# Patient Record
Sex: Male | Born: 1955 | Race: White | Hispanic: No | Smoking: Current every day smoker
Health system: Southern US, Community
[De-identification: ages and names within clinical notes are randomized; demographics above are authoritative.]

## PROBLEM LIST (undated history)

## (undated) DIAGNOSIS — I10 Essential (primary) hypertension: Secondary | ICD-10-CM

## (undated) DIAGNOSIS — G450 Vertebro-basilar artery syndrome: Secondary | ICD-10-CM

## (undated) DIAGNOSIS — J9859 Other diseases of mediastinum, not elsewhere classified: Secondary | ICD-10-CM

## (undated) DIAGNOSIS — M1990 Unspecified osteoarthritis, unspecified site: Secondary | ICD-10-CM

## (undated) DIAGNOSIS — R42 Dizziness and giddiness: Secondary | ICD-10-CM

## (undated) DIAGNOSIS — I779 Disorder of arteries and arterioles, unspecified: Secondary | ICD-10-CM

## (undated) DIAGNOSIS — H538 Other visual disturbances: Secondary | ICD-10-CM

## (undated) HISTORY — DX: Dizziness and giddiness: R42

## (undated) HISTORY — PX: MASS EXCISION: SHX2000

## (undated) HISTORY — PX: TONSILLECTOMY: SUR1361

## (undated) HISTORY — PX: KNEE SURGERY: SHX244

## (undated) HISTORY — DX: Other visual disturbances: H53.8

---

## 2000-11-19 ENCOUNTER — Encounter: Payer: Self-pay | Admitting: Family Medicine

## 2000-11-19 ENCOUNTER — Ambulatory Visit (HOSPITAL_COMMUNITY): Admission: RE | Admit: 2000-11-19 | Discharge: 2000-11-19 | Payer: Self-pay | Admitting: Family Medicine

## 2015-10-06 ENCOUNTER — Encounter (HOSPITAL_COMMUNITY): Payer: Self-pay | Admitting: Emergency Medicine

## 2015-10-06 ENCOUNTER — Emergency Department (HOSPITAL_COMMUNITY): Payer: Self-pay

## 2015-10-06 ENCOUNTER — Emergency Department (HOSPITAL_COMMUNITY)
Admission: EM | Admit: 2015-10-06 | Discharge: 2015-10-06 | Disposition: A | Discharge status: Home / Self Care | Payer: Self-pay | Attending: Emergency Medicine | Admitting: Emergency Medicine

## 2015-10-06 DIAGNOSIS — R911 Solitary pulmonary nodule: Secondary | ICD-10-CM | POA: Insufficient documentation

## 2015-10-06 DIAGNOSIS — F17200 Nicotine dependence, unspecified, uncomplicated: Secondary | ICD-10-CM | POA: Insufficient documentation

## 2015-10-06 DIAGNOSIS — I10 Essential (primary) hypertension: Secondary | ICD-10-CM | POA: Insufficient documentation

## 2015-10-06 DIAGNOSIS — R0789 Other chest pain: Secondary | ICD-10-CM | POA: Insufficient documentation

## 2015-10-06 DIAGNOSIS — Z79899 Other long term (current) drug therapy: Secondary | ICD-10-CM | POA: Insufficient documentation

## 2015-10-06 HISTORY — DX: Essential (primary) hypertension: I10

## 2015-10-06 LAB — CBC
HCT: 43.8 % (ref 39.0–52.0)
Hemoglobin: 14.6 g/dL (ref 13.0–17.0)
MCH: 29.4 pg (ref 26.0–34.0)
MCHC: 33.3 g/dL (ref 30.0–36.0)
MCV: 88.3 fL (ref 78.0–100.0)
Platelets: 274 10*3/uL (ref 150–400)
RBC: 4.96 MIL/uL (ref 4.22–5.81)
RDW: 14.3 % (ref 11.5–15.5)
WBC: 13.1 10*3/uL — ABNORMAL HIGH (ref 4.0–10.5)

## 2015-10-06 LAB — D-DIMER, QUANTITATIVE: D-Dimer, Quant: 1.61 ug/mL-FEU — ABNORMAL HIGH (ref 0.00–0.50)

## 2015-10-06 LAB — BASIC METABOLIC PANEL
Anion gap: 12 (ref 5–15)
BUN: 23 mg/dL — ABNORMAL HIGH (ref 6–20)
CO2: 22 mmol/L (ref 22–32)
Calcium: 8.5 mg/dL — ABNORMAL LOW (ref 8.9–10.3)
Chloride: 101 mmol/L (ref 101–111)
Creatinine, Ser: 1.47 mg/dL — ABNORMAL HIGH (ref 0.61–1.24)
GFR calc Af Amer: 58 mL/min — ABNORMAL LOW (ref >60)
GFR calc non Af Amer: 50 mL/min — ABNORMAL LOW (ref >60)
Glucose, Bld: 113 mg/dL — ABNORMAL HIGH (ref 65–99)
Potassium: 3.5 mmol/L (ref 3.5–5.1)
Sodium: 135 mmol/L (ref 135–145)

## 2015-10-06 LAB — I-STAT TROPONIN, ED: Troponin i, poc: 0.02 ng/mL (ref 0.00–0.08)

## 2015-10-06 LAB — LIPASE, BLOOD: Lipase: 30 U/L (ref 11–51)

## 2015-10-06 IMAGING — DX DG CHEST 2V
2 series · 2 of 2 positions shown · non-contrast
Comparison: None.

CLINICAL DATA: Chest tightness and cough today. Dizziness for 1
week.

EXAM:
CHEST  2 VIEW

[w chest pa]
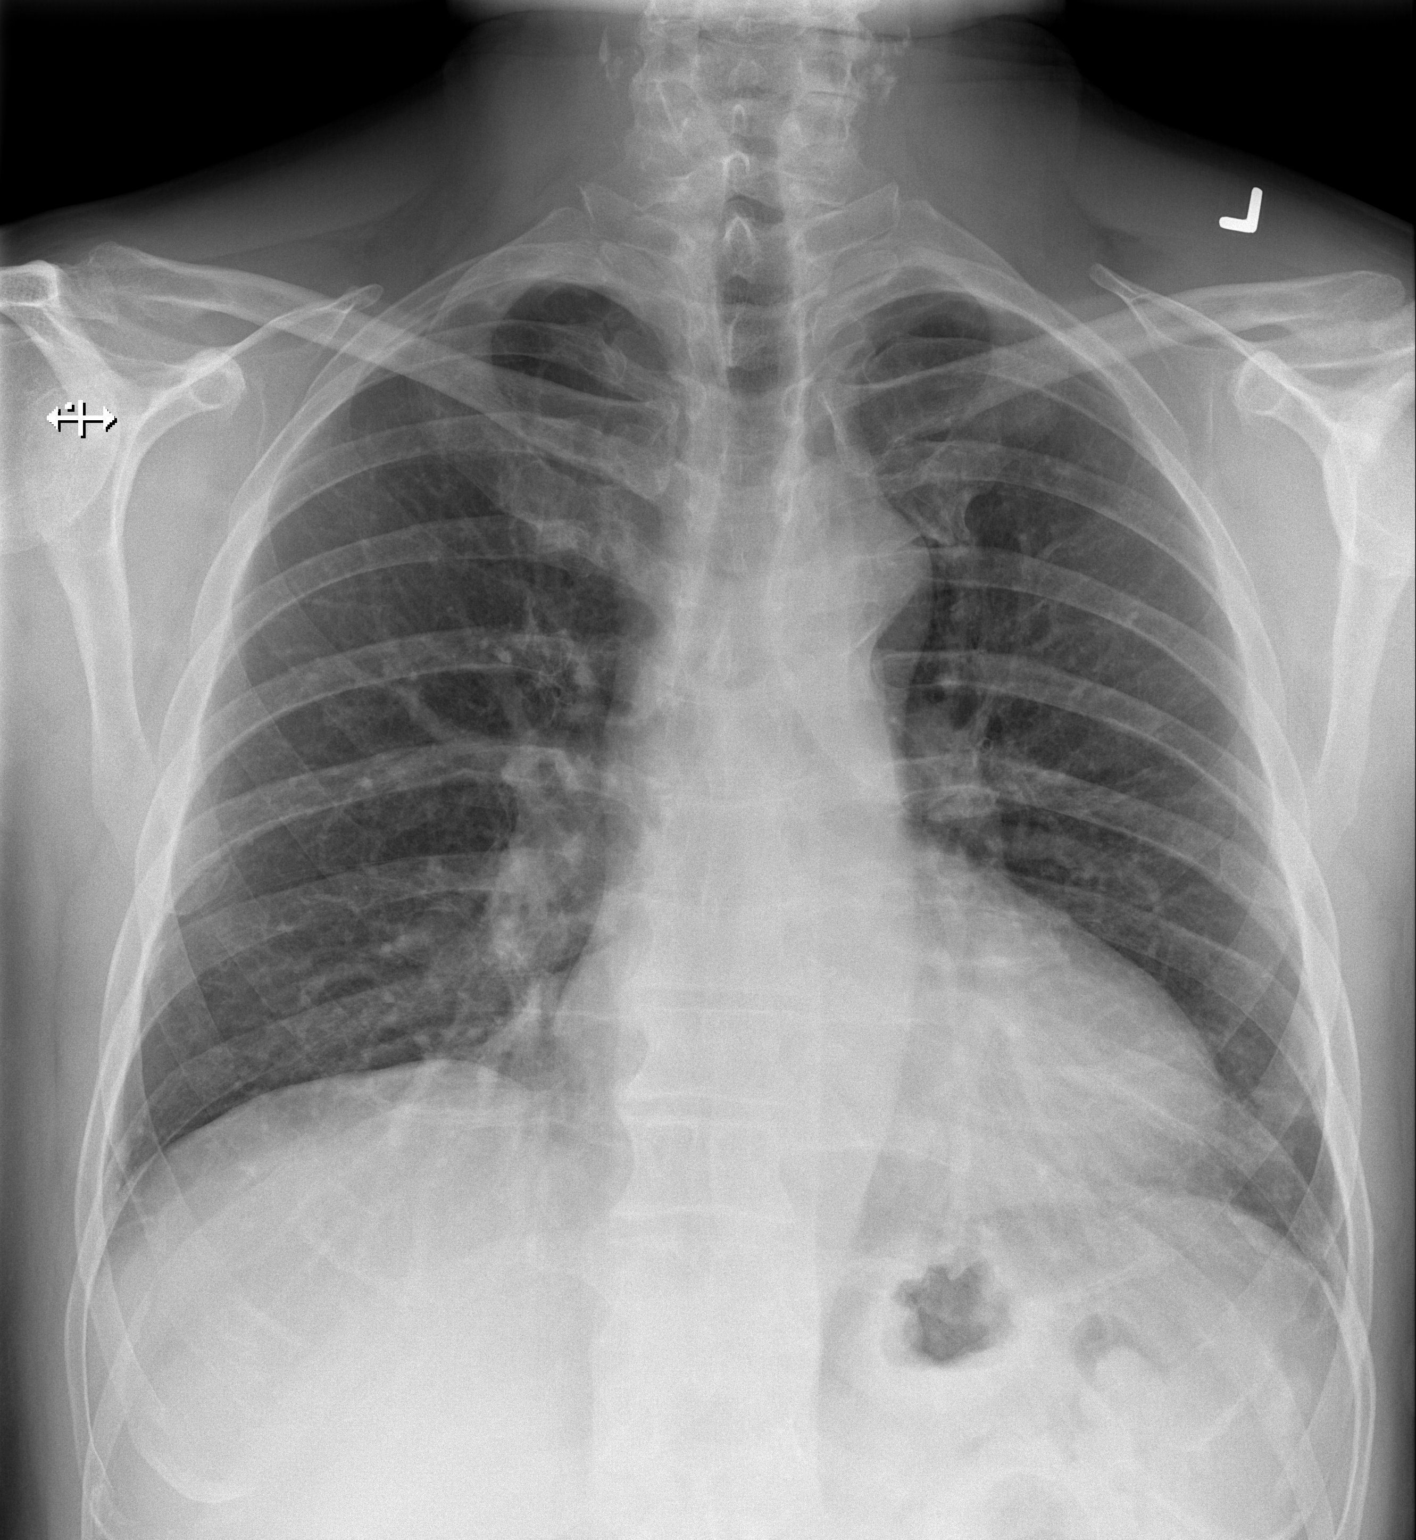

[w chest lat]
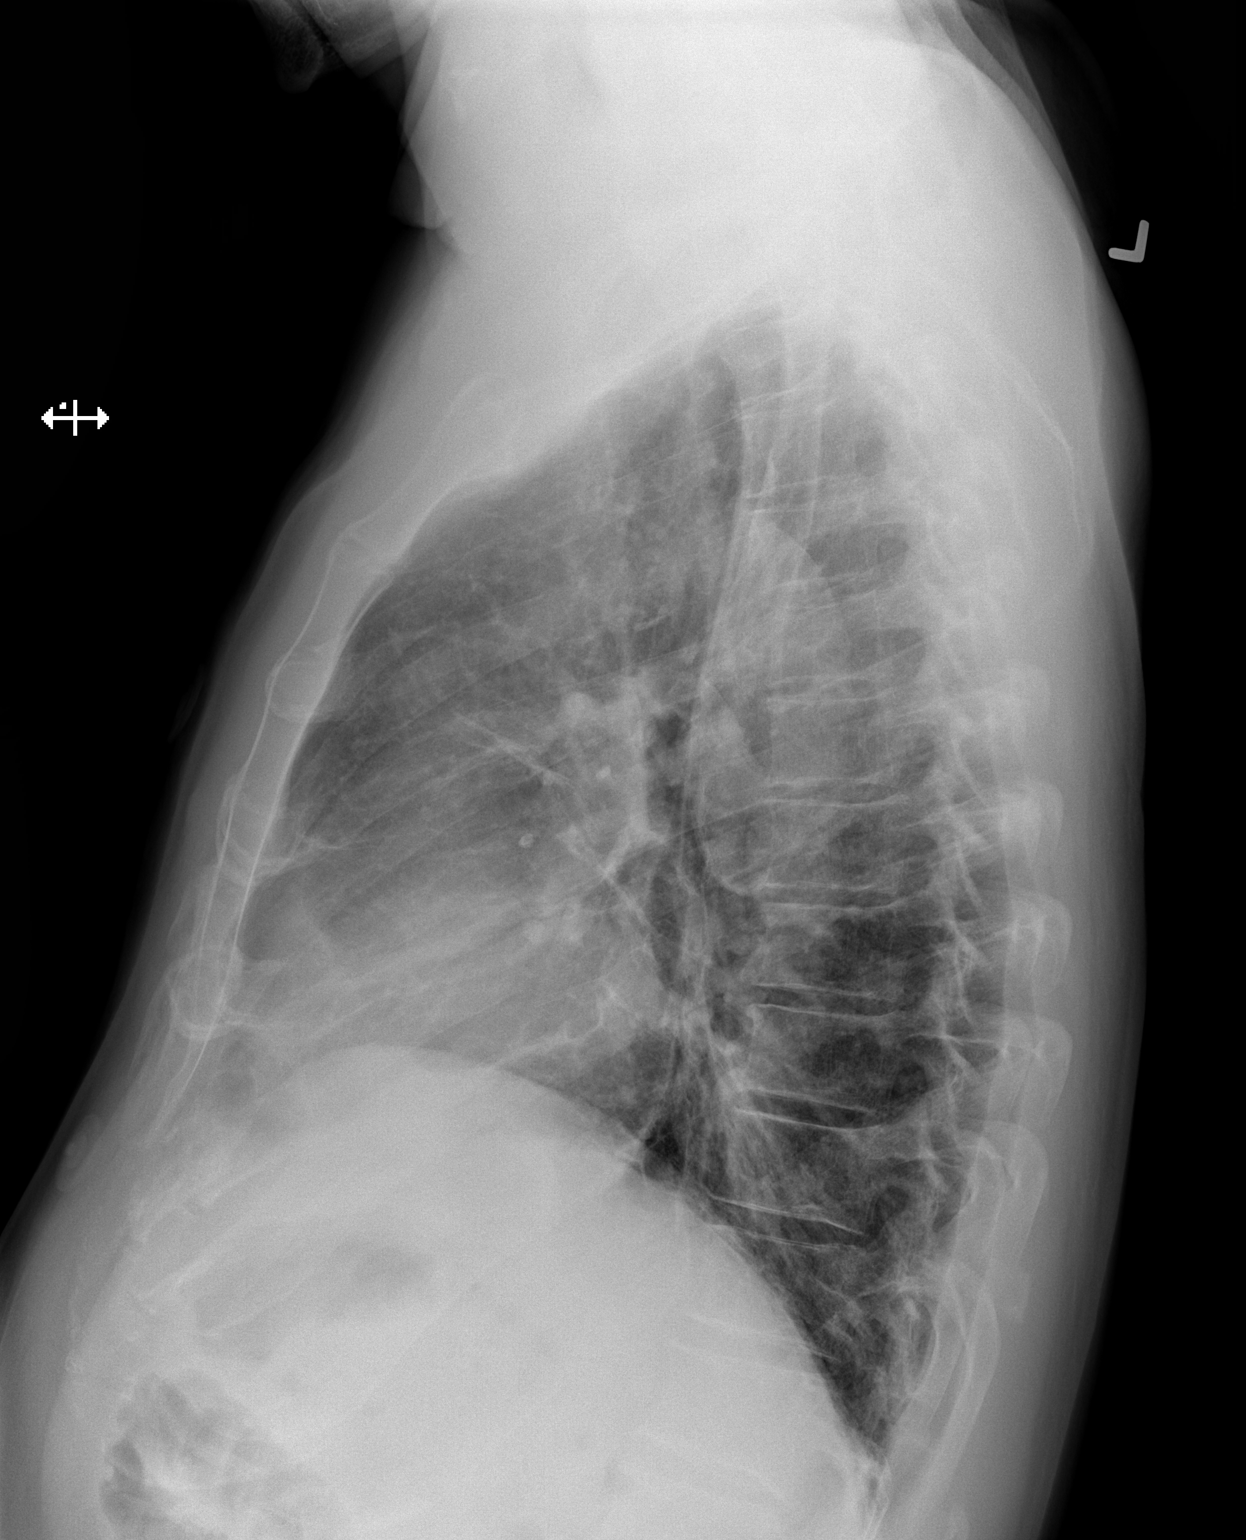

[2 of 2 positions shown; findings below may reference images not displayed]

FINDINGS: The heart size is at the upper limits of normal. The mediastinal
contours are normal. There is streaky bibasilar opacity which is
most consistent with atelectasis or scarring. No edema, confluent
airspace opacity or pleural effusion. The bones appear unremarkable.
IMPRESSION: No suspected acute findings. Streaky bibasilar atelectasis or
scarring without consolidation.

## 2015-10-06 IMAGING — CT CT ANGIO CHEST
1 of 9 series · 14 of 36 positions shown · IV contrast (Iohexol (Omnipaque 350))
Comparison: None.

CLINICAL DATA: Pain with inspiration since yesterday. Productive
cough.

EXAM:
CT ANGIOGRAPHY CHEST WITH CONTRAST
TECHNIQUE: Multidetector CT imaging of the chest was performed using the
standard protocol during bolus administration of intravenous
contrast. Multiplanar CT image reconstructions and MIPs were
obtained to evaluate the vascular anatomy.
CONTRAST:  80 cc Isovue 370

[Series 406: thins pacs · axial · 0.71mm/px · z∈[-355,-90]mm · 14 of 307 slices shown]
[im 21/307  lung]
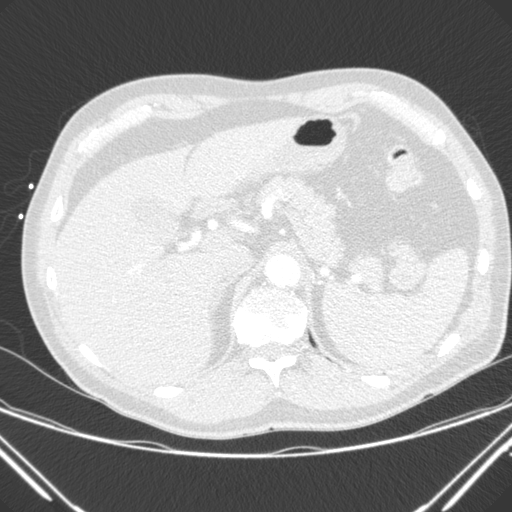
[im 41/307  mediastinal]
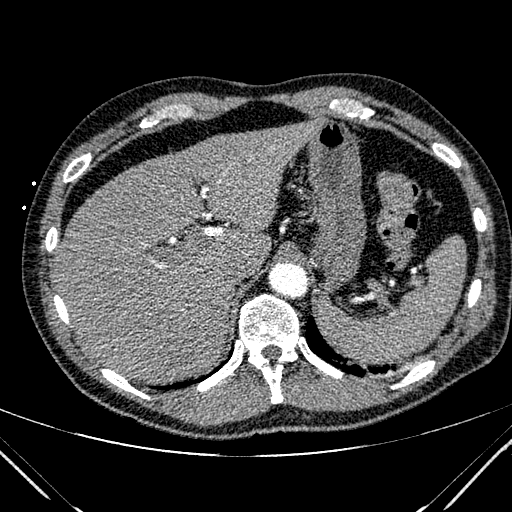
[im 62/307  lung]
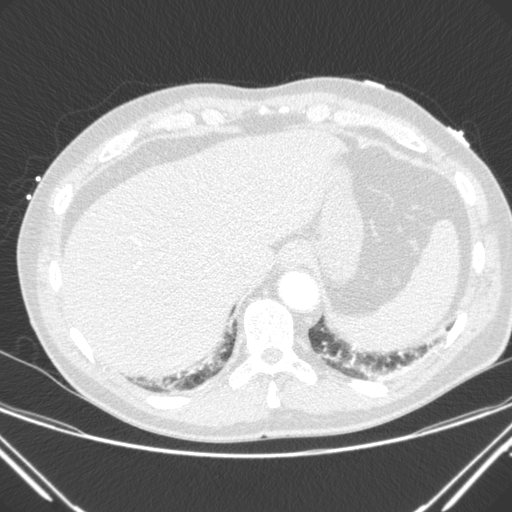
[im 82/307  mediastinal]
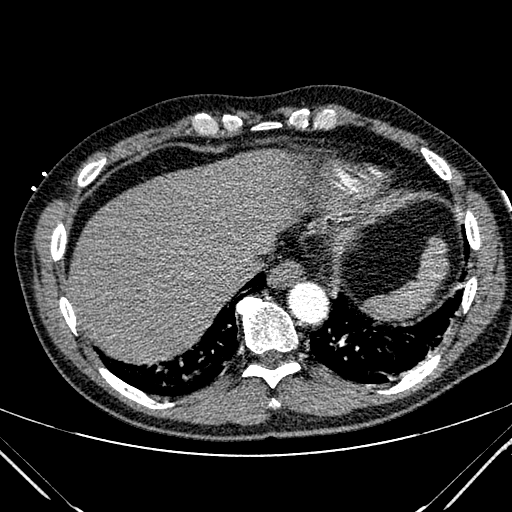
[im 103/307  lung]
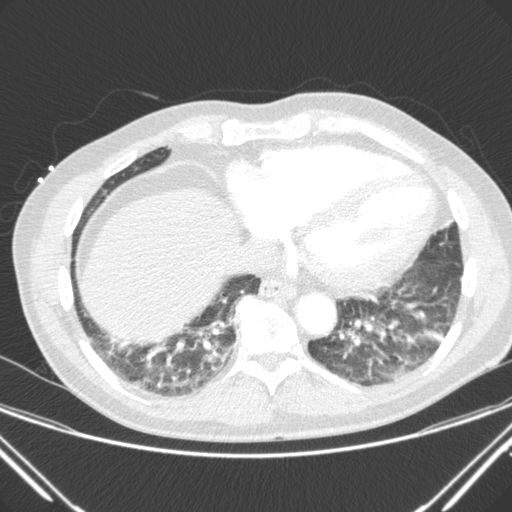
[im 123/307  mediastinal]
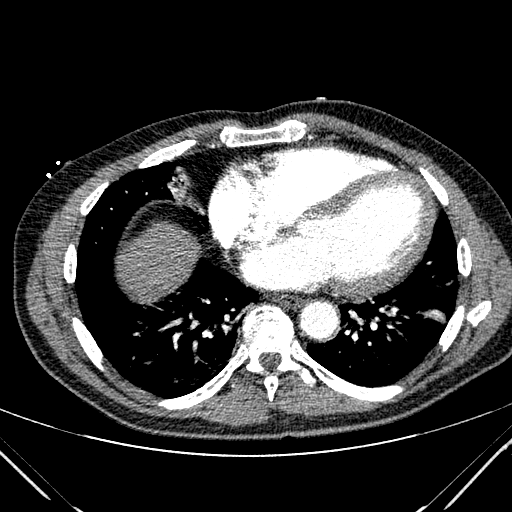
[im 143/307  lung]
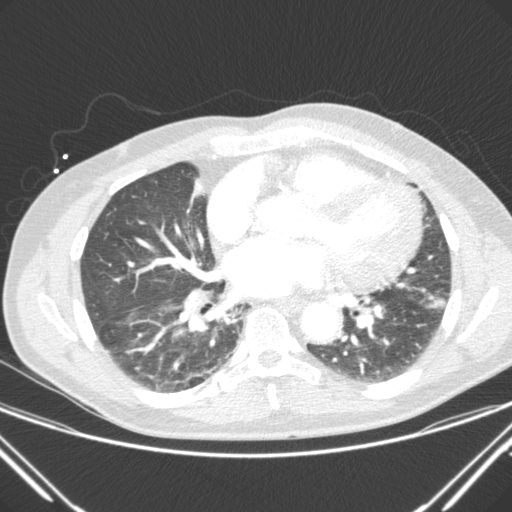
[im 164/307  mediastinal]
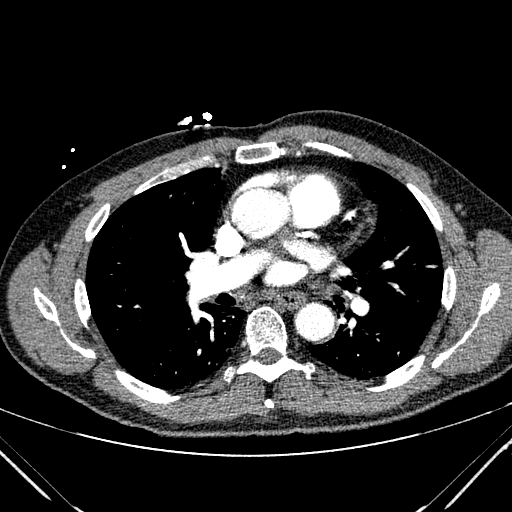
[im 184/307  lung]
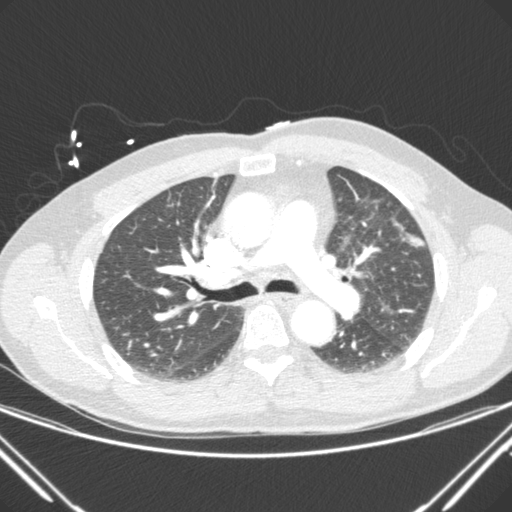
[im 205/307  mediastinal]
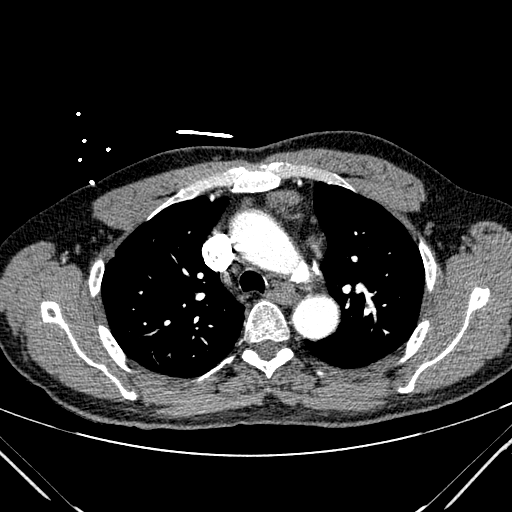
[im 225/307  lung]
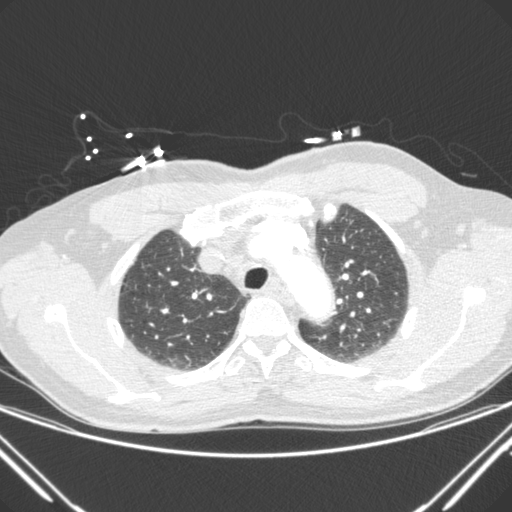
[im 245/307  mediastinal]
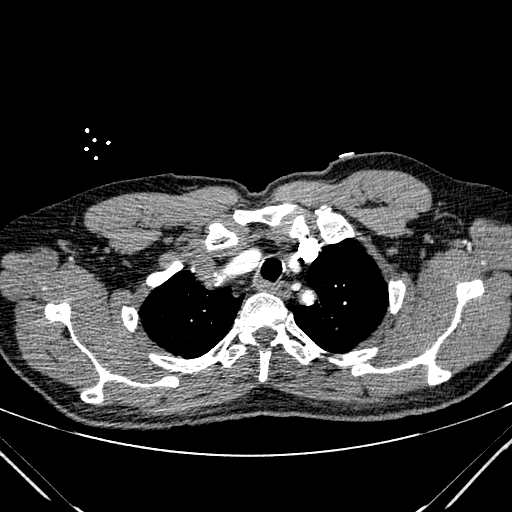
[im 266/307  lung]
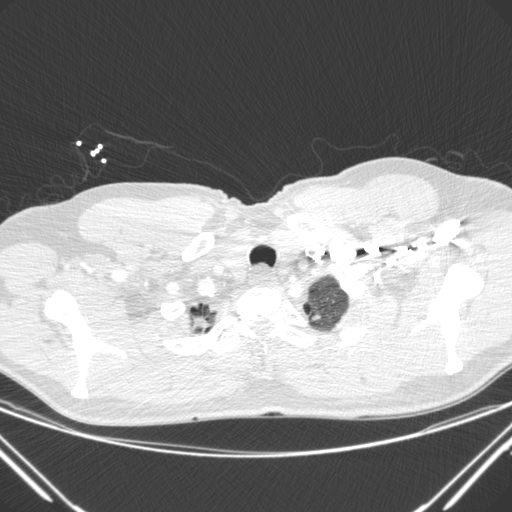
[im 286/307  mediastinal]
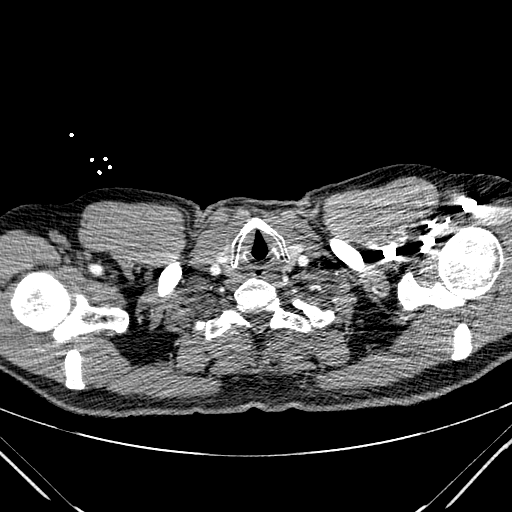

[14 of 36 positions shown; findings below may reference images not displayed]

FINDINGS: Mediastinum / Lymph Nodes: There is no axillary lymphadenopathy.
cm short axis prevascular lymph node is seen on image 45 of series
401. Calcified nodal tissue is seen in both hilar regions. The heart
is enlarged. No pericardial effusion. Coronary artery calcification
is noted. The esophagus has normal imaging features.

No filling defect within the opacified pulmonary arteries to suggest
the presence of an acute pulmonary embolus. No thoracic aortic
aneurysm. No dissection of the thoracic aorta.

Lungs / Pleura: Paraseptal emphysema noted in the lung apices. There
is atelectasis or scarring in the medial right middle lobe.
Compressive atelectasis is seen in the lower lungs bilaterally.
Atelectasis or linear scarring also noted in the lingula.
Interstitial thickening is noted. No dense focal airspace
consolidation. No overt airspace pulmonary edema or pleural
effusion.

Upper Abdomen:  Unremarkable.

[HOSPITAL] / Soft Tissues: Bone windows reveal no worrisome lytic or
sclerotic osseous lesions.

Review of the MIP images confirms the above findings.
IMPRESSION: 1. No CT evidence for acute pulmonary embolus.
2. 2.1 cm short axis lymph node in the prevascular mediastinum. This
is abnormal. Consider PET-CT to determine whether the lesion is
hypermetabolic.
3. Low volumes with bibasilar atelectasis.

## 2015-10-06 MED ORDER — TRAMADOL HCL 50 MG PO TABS: 50.0000 mg | ORAL_TABLET | Freq: Four times a day (QID) | ORAL | Status: DC | PRN

## 2015-10-06 MED ORDER — IOPAMIDOL (ISOVUE-370) INJECTION 76%
INTRAVENOUS | Status: AC
  Administered 2015-10-06: 80 mL
  Filled 2015-10-06: qty 100

## 2015-10-06 MED ORDER — SODIUM CHLORIDE 0.9 % IV BOLUS (SEPSIS)
1000.0000 mL | Freq: Once | INTRAVENOUS | Status: AC
  Administered 2015-10-06: 1000 mL via INTRAVENOUS

## 2015-10-06 MED ORDER — KETOROLAC TROMETHAMINE 30 MG/ML IJ SOLN
15.0000 mg | Freq: Once | INTRAMUSCULAR | Status: AC
  Administered 2015-10-06: 15 mg via INTRAVENOUS
  Filled 2015-10-06: qty 1

## 2015-10-06 NOTE — ED Notes (Signed)
Patient transported to CT 

## 2015-10-06 NOTE — ED Provider Notes (Signed)
CSN: [id number]     Arrival date & time 10/06/15  1354 History   First MD Initiated Contact with Patient 10/06/15 1711     Chief Complaint  Patient presents with  . Chest Pain     (Consider location/radiation/quality/duration/timing/severity/associated sxs/prior Treatment) HPI Patient presents with concern of chest pain. Pain began about 10 hours ago. Since onset pain has been persistent. Pain is anterior, diffusely radiating, worse with upright positioning, stretching, inspiration, motion. Symptoms have improved with aspirin, nitroglycerin. Minimal ongoing complaints. No clear precipitant, and the patient states that he was well prior to the onset of symptoms.   Patient denies history of cardiac disease. He is a smoker.   Smoking cessation provided, particularly in light of this patient's evaluation in the ED.   Past Medical History  Diagnosis Date  . Hypertension    No past surgical history on file. No family history on file. Social History  Substance Use Topics  . Smoking status: Current Every Day Smoker  . Smokeless tobacco: Not on file  . Alcohol Use: Yes     Comment: occ    Review of Systems  Constitutional:       Per HPI, otherwise negative  HENT:       Per HPI, otherwise negative  Respiratory:       Per HPI, otherwise negative  Cardiovascular:       Per HPI, otherwise negative  Gastrointestinal: Negative for vomiting.  Endocrine:       Negative aside from HPI  Genitourinary:       Neg aside from HPI   Musculoskeletal:       Per HPI, otherwise negative  Skin: Negative.   Neurological: Negative for syncope.      Allergies  Review of patient's allergies indicates no known allergies.  Home Medications   Prior to Admission medications   Medication Sig Start Date End Date Taking? Authorizing Provider  lisinopril-hydrochlorothiazide (PRINZIDE,ZESTORETIC) 10-12.5 MG tablet Take 1 tablet by mouth daily.   Yes Historical Provider, MD   BP 128/68  mmHg  Pulse 76  Temp(Src) 98.1 F (36.7 C) (Oral)  Resp 24  SpO2 97% Physical Exam  Constitutional: He is oriented to person, place, and time. He appears well-developed. No distress.  HENT:  Head: Normocephalic and atraumatic.  Eyes: Conjunctivae and EOM are normal.  Cardiovascular: Normal rate and regular rhythm.   Pulmonary/Chest: Effort normal. No stridor. No respiratory distress.  Abdominal: He exhibits no distension.  Musculoskeletal: He exhibits no edema.  Neurological: He is alert and oriented to person, place, and time.  Skin: Skin is warm and dry.  Psychiatric: He has a normal mood and affect.  Nursing note and vitals reviewed.   ED Course  Procedures (including critical care time) Labs Review Labs Reviewed  BASIC METABOLIC PANEL - Abnormal; Notable for the following:    Glucose, Bld 113 (*)    BUN 23 (*)    Creatinine, Ser 1.47 (*)    Calcium 8.5 (*)    GFR calc non Af Amer 50 (*)    GFR calc Af Amer 58 (*)    All other components within normal limits  CBC - Abnormal; Notable for the following:    WBC 13.1 (*)    All other components within normal limits  D-DIMER, QUANTITATIVE (NOT AT [facility])  Randolm Idol, ED    Imaging Review Dg Chest 2 View  10/06/2015  CLINICAL DATA:  Chest tightness and cough today. Dizziness for 1 week. EXAM: CHEST  2 VIEW COMPARISON:  None. FINDINGS: The heart size is at the upper limits of normal. The mediastinal contours are normal. There is streaky bibasilar opacity which is most consistent with atelectasis or scarring. No edema, confluent airspace opacity or pleural effusion. The bones appear unremarkable. IMPRESSION: No suspected acute findings. Streaky bibasilar atelectasis or scarring without consolidation. Electronically Signed   By: Richardean Sale M.D.   On: 10/06/2015 14:31   Ct Angio Chest Pe W/cm &/or Wo Cm  10/06/2015  CLINICAL DATA:  Pain with inspiration since yesterday. Productive cough. EXAM: CT ANGIOGRAPHY CHEST WITH  CONTRAST TECHNIQUE: Multidetector CT imaging of the chest was performed using the standard protocol during bolus administration of intravenous contrast. Multiplanar CT image reconstructions and MIPs were obtained to evaluate the vascular anatomy. CONTRAST:  80 cc Isovue 370 COMPARISON:  None. FINDINGS: Mediastinum / Lymph Nodes: There is no axillary lymphadenopathy. 2.1 cm short axis prevascular lymph node is seen on image 45 of series 401. Calcified nodal tissue is seen in both hilar regions. The heart is enlarged. No pericardial effusion. Coronary artery calcification is noted. The esophagus has normal imaging features. No filling defect within the opacified pulmonary arteries to suggest the presence of an acute pulmonary embolus. No thoracic aortic aneurysm. No dissection of the thoracic aorta. Lungs / Pleura: Paraseptal emphysema noted in the lung apices. There is atelectasis or scarring in the medial right middle lobe. Compressive atelectasis is seen in the lower lungs bilaterally. Atelectasis or linear scarring also noted in the lingula. Interstitial thickening is noted. No dense focal airspace consolidation. No overt airspace pulmonary edema or pleural effusion. Upper Abdomen:  Unremarkable. MSK / Soft Tissues: Bone windows reveal no worrisome lytic or sclerotic osseous lesions. Review of the MIP images confirms the above findings. IMPRESSION: 1. No CT evidence for acute pulmonary embolus. 2. 2.1 cm short axis lymph node in the prevascular mediastinum. This is abnormal. Consider PET-CT to determine whether the lesion is hypermetabolic. 3. Low volumes with bibasilar atelectasis. Electronically Signed   By: Misty Stanley M.D.   On: 10/06/2015 19:58   I have personally reviewed and evaluated these images and lab results as part of my medical decision-making.   EKG Interpretation   Date/Time:  Tuesday October 06 2015 13:59:31 EDT Ventricular Rate:  86 PR Interval:  174 QRS Duration: 96 QT Interval:   408 QTC Calculation: 488 R Axis:   101 Text Interpretation:  Normal sinus rhythm Rightward axis Prolonged QT  Abnormal ECG Sinus rhythm hypertrophic changes Rightward axis Abnormal ekg  Confirmed by Carmin Muskrat  MD [phone]) on 10/06/2015 5:10:08 PM     8:57 PM With the patient's brother present reviewed the CT imaging, demonstrating the presence of the nodule or lymph node. We discussed all other findings as well. We discussed the importance for follow-up with cardiothoracic for next step in evaluation.  MDM   Final diagnoses:  Atypical chest pain  Lung nodule   Reason presents with new chest discomfort. Patient's prescription of symptoms that are worse with positioning, particularly stretching his back and chest are consistent with the demonstrated presence of a nodule or lymph node adjacent to the sternum, and possible mass effect. No evidence for ongoing coronary ischemia, nor pulmonary embolism. No evidence for pneumonia. Patient is a smoker, and rotation about the importance of close follow-up, the patient was discharged in stable condition to see cardiothoracic.  Carmin Muskrat, MD 10/06/15 [phone]

## 2015-10-06 NOTE — ED Notes (Signed)
Pt sts generalized CP and sent here for eval by PCP; pt sts pain worse with inspiration and movement; pt took ASA and 1 nitro

## 2015-10-06 NOTE — Discharge Instructions (Signed)
As discussed, your evaluation today has demonstrated the presence of a nodule or possibly a lymph node in your chest wall.  It is important to follow-up with our specialist for the next step in evaluation.  If you develop new, or concerning changes in your condition, please be sure to return here for further evaluation.

## 2020-03-20 DIAGNOSIS — I639 Cerebral infarction, unspecified: Secondary | ICD-10-CM

## 2020-03-20 HISTORY — DX: Cerebral infarction, unspecified: I63.9

## 2020-04-01 ENCOUNTER — Emergency Department (HOSPITAL_COMMUNITY)
Admission: EM | Admit: 2020-04-01 | Discharge: 2020-04-01 | Disposition: A | Discharge status: Home / Self Care | Payer: Medicare HMO | Attending: Emergency Medicine | Admitting: Emergency Medicine

## 2020-04-01 ENCOUNTER — Emergency Department (HOSPITAL_COMMUNITY): Payer: Medicare HMO

## 2020-04-01 ENCOUNTER — Other Ambulatory Visit: Payer: Self-pay

## 2020-04-01 ENCOUNTER — Encounter (HOSPITAL_COMMUNITY): Payer: Self-pay | Admitting: *Deleted

## 2020-04-01 DIAGNOSIS — I720 Aneurysm of carotid artery: Secondary | ICD-10-CM | POA: Insufficient documentation

## 2020-04-01 DIAGNOSIS — R42 Dizziness and giddiness: Secondary | ICD-10-CM | POA: Diagnosis not present

## 2020-04-01 DIAGNOSIS — I10 Essential (primary) hypertension: Secondary | ICD-10-CM | POA: Diagnosis not present

## 2020-04-01 DIAGNOSIS — F17200 Nicotine dependence, unspecified, uncomplicated: Secondary | ICD-10-CM | POA: Insufficient documentation

## 2020-04-01 DIAGNOSIS — Z7982 Long term (current) use of aspirin: Secondary | ICD-10-CM | POA: Insufficient documentation

## 2020-04-01 DIAGNOSIS — Z7902 Long term (current) use of antithrombotics/antiplatelets: Secondary | ICD-10-CM | POA: Insufficient documentation

## 2020-04-01 DIAGNOSIS — Z79899 Other long term (current) drug therapy: Secondary | ICD-10-CM | POA: Diagnosis not present

## 2020-04-01 LAB — CBG MONITORING, ED: Glucose-Capillary: 114 mg/dL — ABNORMAL HIGH (ref 70–99)

## 2020-04-01 LAB — URINALYSIS, ROUTINE W REFLEX MICROSCOPIC
Bilirubin Urine: NEGATIVE
Glucose, UA: NEGATIVE mg/dL
Hgb urine dipstick: NEGATIVE
Ketones, ur: NEGATIVE mg/dL
Leukocytes,Ua: NEGATIVE
Nitrite: NEGATIVE
Protein, ur: NEGATIVE mg/dL
Specific Gravity, Urine: 1.02 (ref 1.005–1.030)
pH: 5 (ref 5.0–8.0)

## 2020-04-01 LAB — BASIC METABOLIC PANEL
Anion gap: 11 (ref 5–15)
BUN: 25 mg/dL — ABNORMAL HIGH (ref 8–23)
CO2: 23 mmol/L (ref 22–32)
Calcium: 8.8 mg/dL — ABNORMAL LOW (ref 8.9–10.3)
Chloride: 104 mmol/L (ref 98–111)
Creatinine, Ser: 1.59 mg/dL — ABNORMAL HIGH (ref 0.61–1.24)
GFR, Estimated: 45 mL/min — ABNORMAL LOW (ref >60)
Glucose, Bld: 106 mg/dL — ABNORMAL HIGH (ref 70–99)
Potassium: 3.8 mmol/L (ref 3.5–5.1)
Sodium: 138 mmol/L (ref 135–145)

## 2020-04-01 LAB — CBC
HCT: 47 % (ref 39.0–52.0)
Hemoglobin: 16.1 g/dL (ref 13.0–17.0)
MCH: 31.2 pg (ref 26.0–34.0)
MCHC: 34.3 g/dL (ref 30.0–36.0)
MCV: 91.1 fL (ref 80.0–100.0)
Platelets: 217 10*3/uL (ref 150–400)
RBC: 5.16 MIL/uL (ref 4.22–5.81)
RDW: 13.8 % (ref 11.5–15.5)
WBC: 8.3 10*3/uL (ref 4.0–10.5)
nRBC: 0 % (ref 0.0–0.2)

## 2020-04-01 IMAGING — MR MR HEAD W/O CM
11 series · 48 of 48 positions shown · IV contrast (gadavist)
Comparison: None.

CLINICAL DATA: Dizziness and blurred vision

EXAM:
MR HEAD WITHOUT CONTRAST
MR CIRCLE OF WILLIS WITHOUT CONTRAST
MRA OF THE NECK WITHOUT AND WITH CONTRAST
TECHNIQUE: Multiplanar, multiecho pulse sequences of the brain, circle of
willis and surrounding structures were obtained without intravenous
contrast. Angiographic images of the neck were obtained using MRA
technique without and with intravenous contrast.
CONTRAST:  8mL GADAVIST GADOBUTROL 1 MMOL/ML IV SOLN

[Series 5: DWI · axial · 3.0mm · 1.36mm/px · z∈[-35,+120]mm · 6 of 108 slices shown (1 of 4)]
[im 1/108]
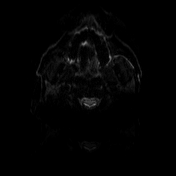
[im 22/108]
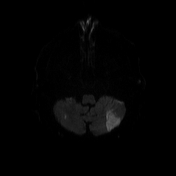
[im 43/108]
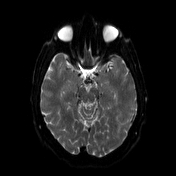
[im 65/108]
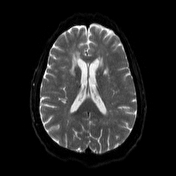
[im 86/108]
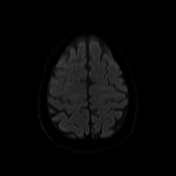
[im 108/108]
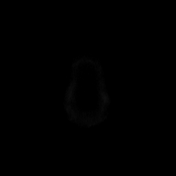

[Series 6: DWI · axial · 3.0mm · 1.36mm/px · z∈[-35,+120]mm · 3 of 54 slices shown (2 of 4)]
[im 1/54]
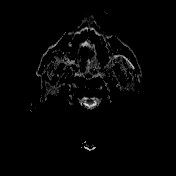
[im 27/54]
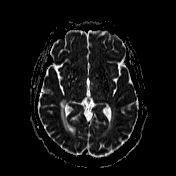
[im 54/54]
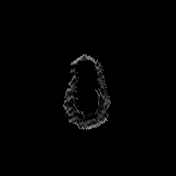

[Series 7: T1 · sagittal · 5.0mm · 0.78mm/px · 2 of 26 slices shown (1 of 2)]
[im 1/26]
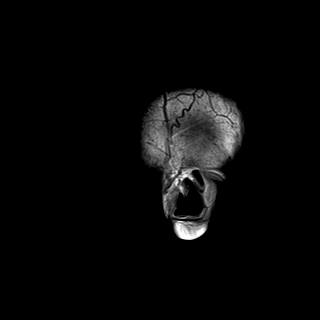
[im 26/26]
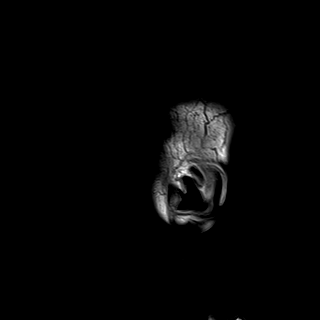

[Series 8: T2 · axial · 5.0mm · 0.62mm/px · z∈[-39,+125]mm · 2 of 27 slices shown (1 of 2)]
[im 1/27]
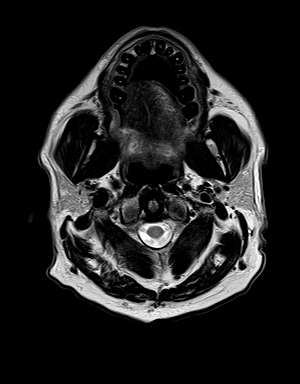
[im 27/27]
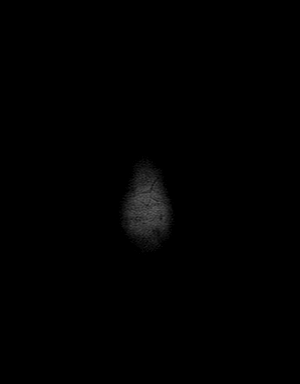

[Series 9: mip_images(sw) · axial · 24.0mm · 0.75mm/px · z∈[-33,+119]mm · 4 of 53 slices shown]
[im 1/53]
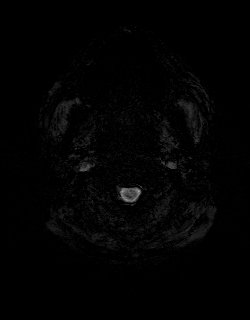
[im 18/53]
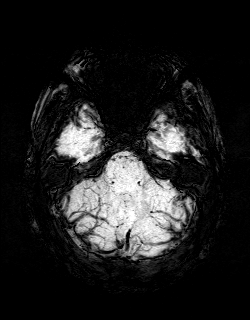
[im 35/53]
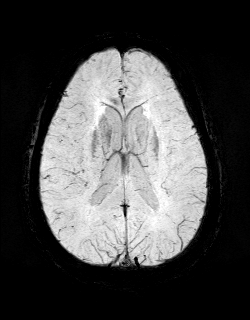
[im 53/53]
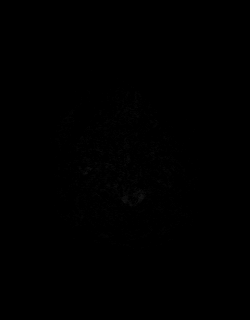

[Series 10: swi_images · axial · 3.0mm · 0.75mm/px · z∈[-43,+129]mm · 4 of 60 slices shown]
[im 1/60]
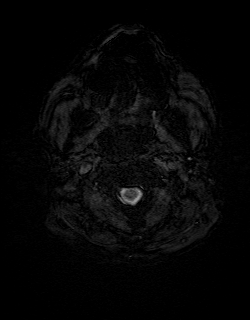
[im 20/60]
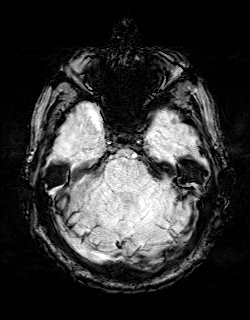
[im 40/60]
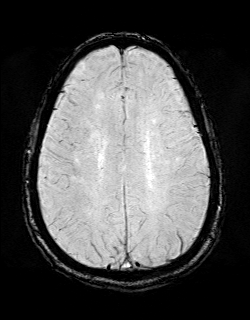
[im 60/60]
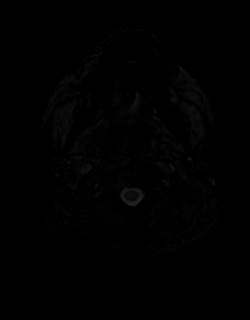

[Series 11: FLAIR · axial · 3.0mm · 0.75mm/px · z∈[-37,+123]mm · 4 of 56 slices shown]
[im 1/56]
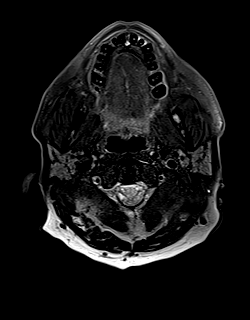
[im 19/56]
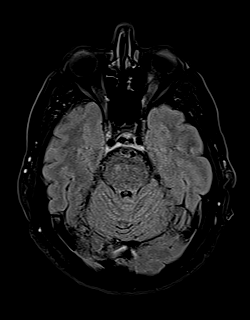
[im 37/56]
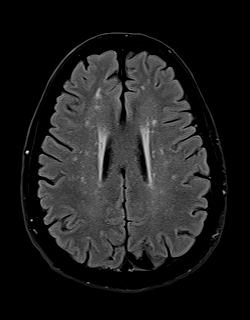
[im 56/56]
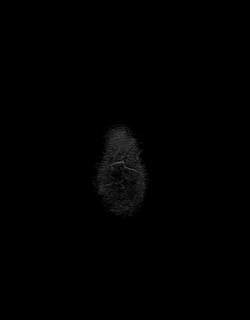

[Series 17: T1 · axial · 1.0mm · 0.94mm/px · z∈[-42,+128]mm · 12 of 176 slices shown (2 of 2)]
[im 1/176]
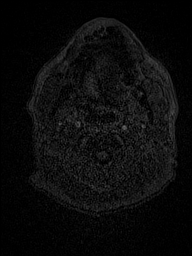
[im 16/176]
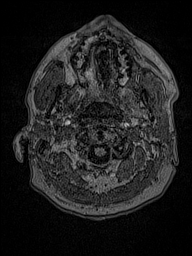
[im 32/176]
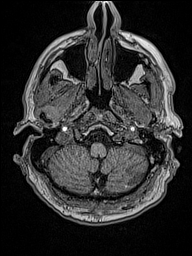
[im 48/176]
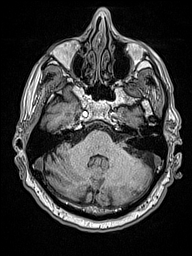
[im 64/176]
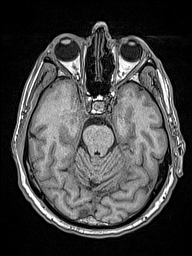
[im 80/176]
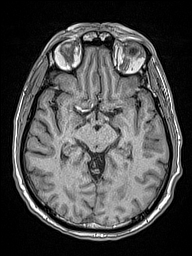
[im 96/176]
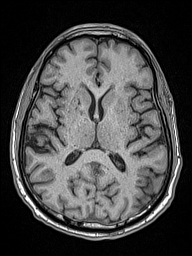
[im 112/176]
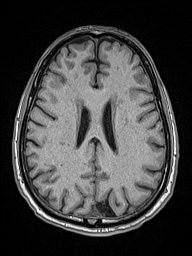
[im 128/176]
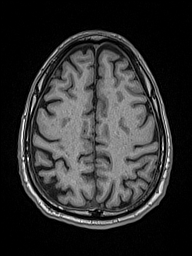
[im 144/176]
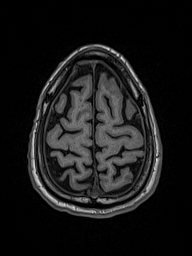
[im 160/176]
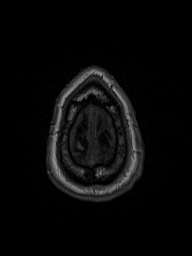
[im 176/176]
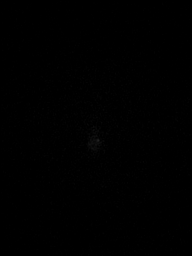

[Series 18: DWI · coronal · 5.0mm · 1.31mm/px · 5 of 80 slices shown (3 of 4)]
[im 1/80]
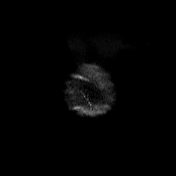
[im 20/80]
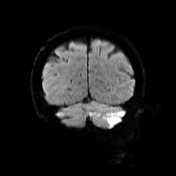
[im 40/80]
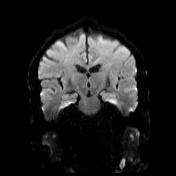
[im 60/80]
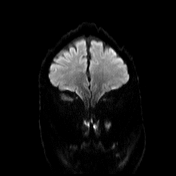
[im 80/80]
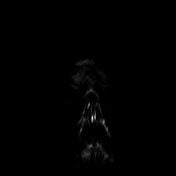

[Series 19: DWI · coronal · 5.0mm · 1.31mm/px · 3 of 40 slices shown (4 of 4)]
[im 1/40]
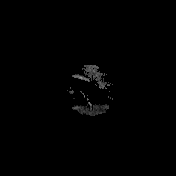
[im 20/40]
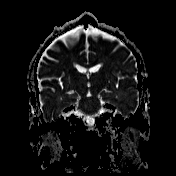
[im 40/40]
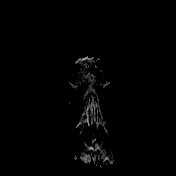

[Series 20: T2 · coronal · 5.0mm · 0.57mm/px · 3 of 40 slices shown (2 of 2)]
[im 1/40]
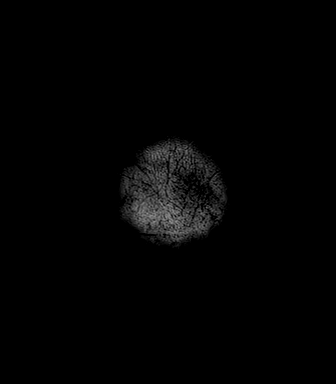
[im 20/40]
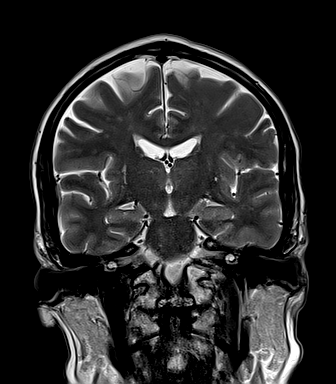
[im 40/40]
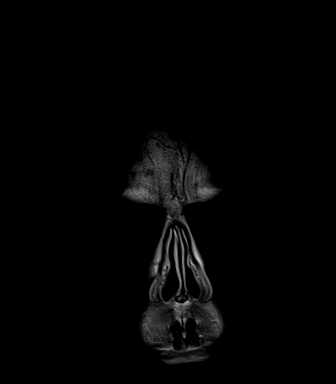

[48 of 48 positions shown; findings below may reference images not displayed]

FINDINGS: MR HEAD FINDINGS

Brain: Bilateral posterior circulation acute ischemia with a
relatively large left PICA territory infarct. Single punctate focus
of supratentorial acute ischemia in the left occipital lobe.
Multifocal hyperintense T2-weighted signal within the white matter.
Normal volume of CSF spaces. No chronic microhemorrhage. Normal
midline structures.

Vascular: Normal flow voids.

Skull and upper cervical spine: Normal marrow signal.

Sinuses/Orbits: Negative.

Other: None.

MR CIRCLE OF WILLIS FINDINGS

POSTERIOR CIRCULATION:

--Vertebral arteries: Moderate-to-severe stenosis of both V4
segments.

--Inferior cerebellar arteries: Normal.

--Basilar artery: Normal.

--Superior cerebellar arteries: Normal proximally

--Posterior cerebral arteries: Suspect occlusion or stenosis of the
distal left P3 segment. Normal right PCA.

ANTERIOR CIRCULATION:

--Intracranial internal carotid arteries: Normal.

--Anterior cerebral arteries (ACA): Normal. Both A1 segments are
present. Patent anterior communicating artery (a-comm).

--Middle cerebral arteries (MCA): Normal.

MRA NECK FINDINGS

Short segment moderate stenosis of the proximal left external
carotid artery. Otherwise normal carotid and vertebral arteries.
IMPRESSION: 1. Bilateral posterior circulation acute ischemia with a relatively
large left PICA territory infarct. No hemorrhage or mass effect.
2. Suspect occlusion or stenosis of the distal left P3 segment.
3. Moderate-to-severe stenosis of both vertebral artery V4 segments.
4. Short segment moderate stenosis of the proximal left external
carotid artery.

## 2020-04-01 IMAGING — CT CT HEAD W/O CM
3 series · 15 of 47 positions shown, 18 images · non-contrast
Comparison: None.

CLINICAL DATA: Dizziness for 2 weeks now with blurred vision

EXAM:
CT HEAD WITHOUT CONTRAST
TECHNIQUE: Contiguous axial images were obtained from the base of the skull
through the vertex without intravenous contrast.

[Series 2: head wo · axial · 0.42mm/px · z∈[+1531,+1666]mm · 9 of 33 slices shown, 12 images]
[im 3/33  brain]
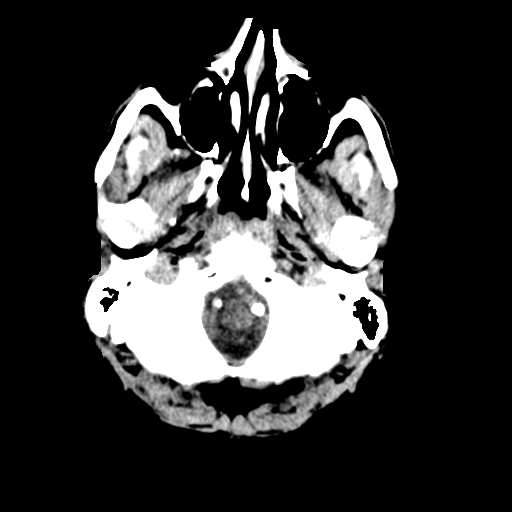
[im 3/33  bone]
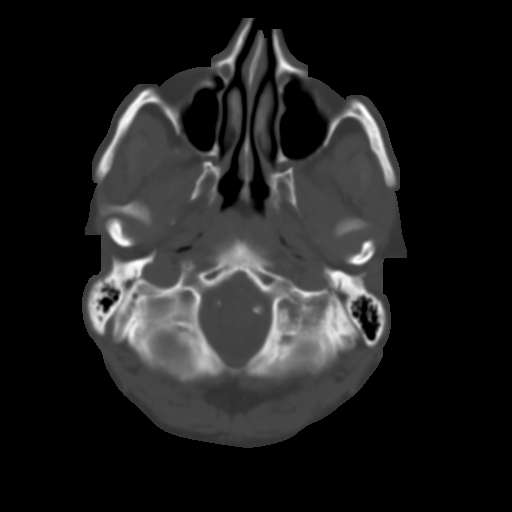
[im 6/33  brain]
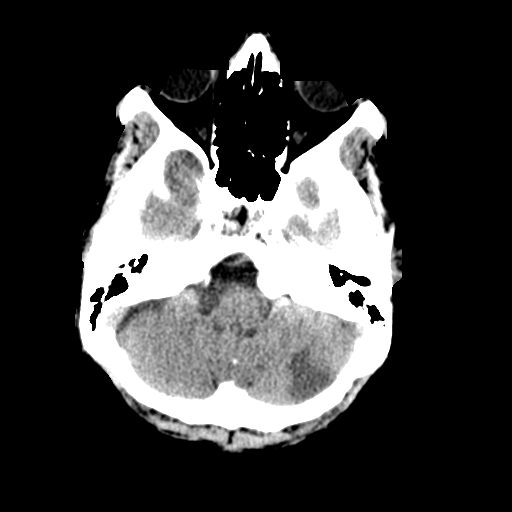
[im 9/33  brain]
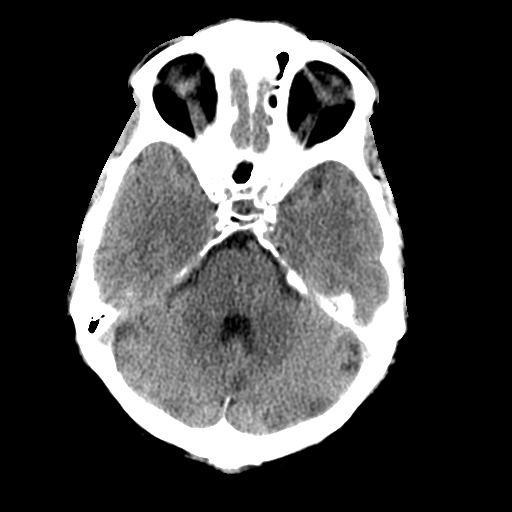
[im 13/33  brain]
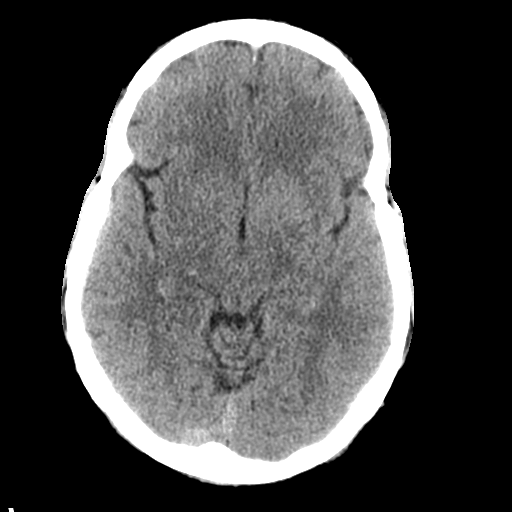
[im 17/33  brain]
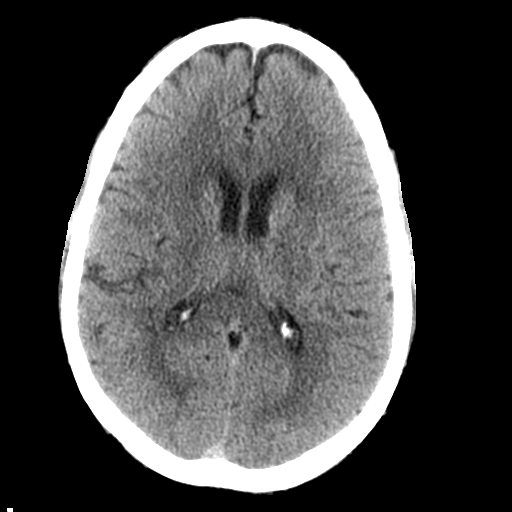
[im 17/33  bone]
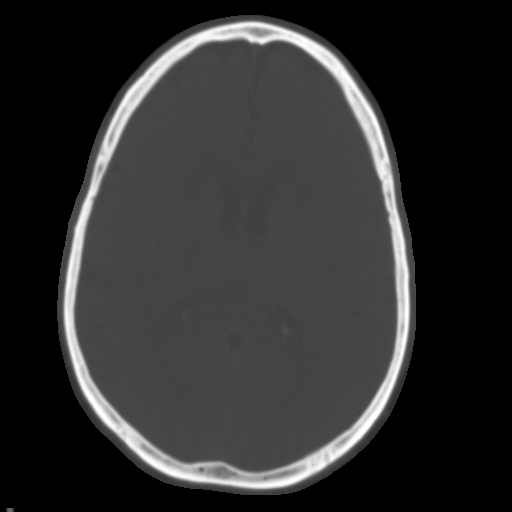
[im 20/33  brain]
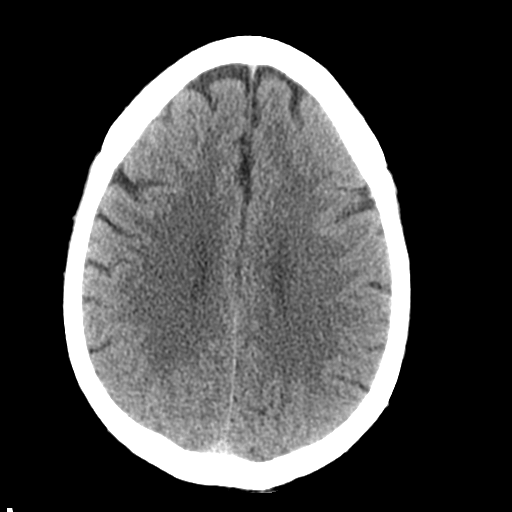
[im 24/33  brain]
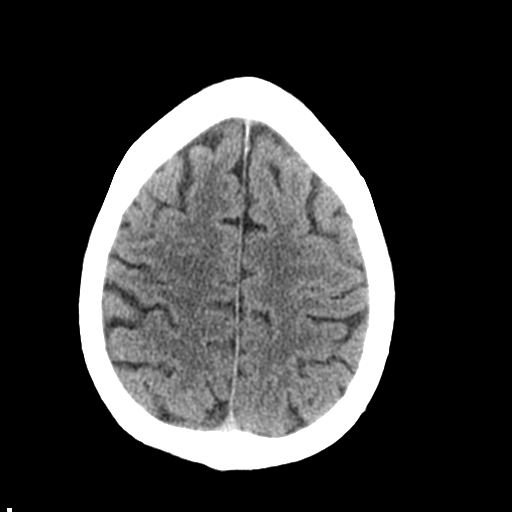
[im 27/33  brain]
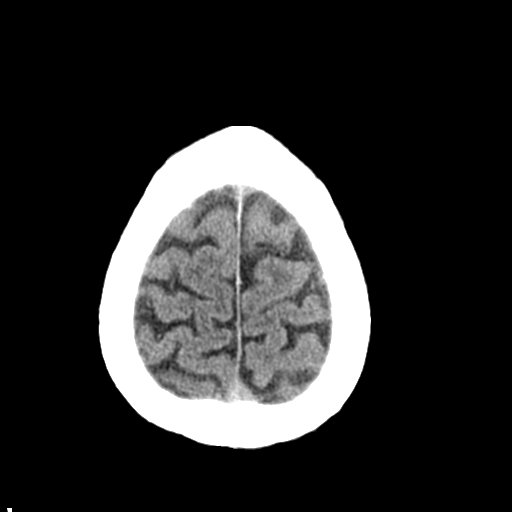
[im 30/33  brain]
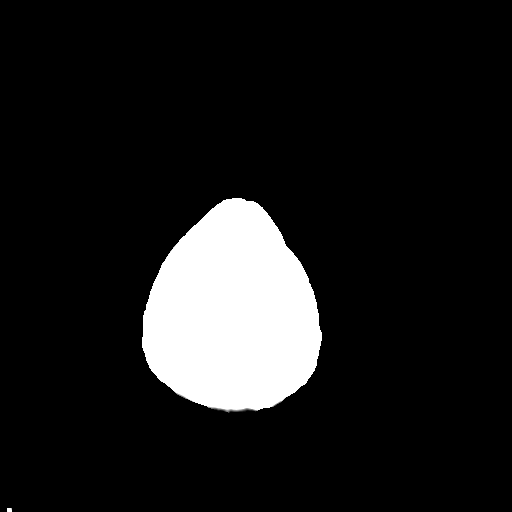
[im 30/33  bone]
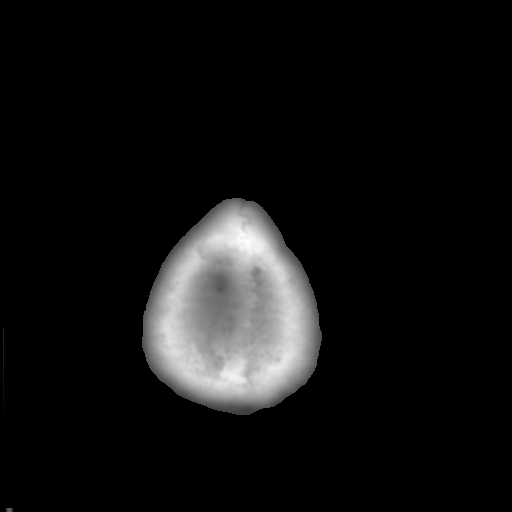

[Series 5: coronal soft tissue · coronal · 0.31mm/px · 3 of 70 slices shown]
[im 24/70  brain]
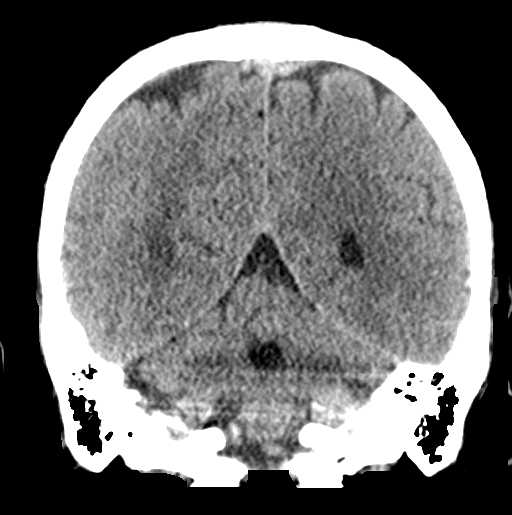
[im 31/70  brain]
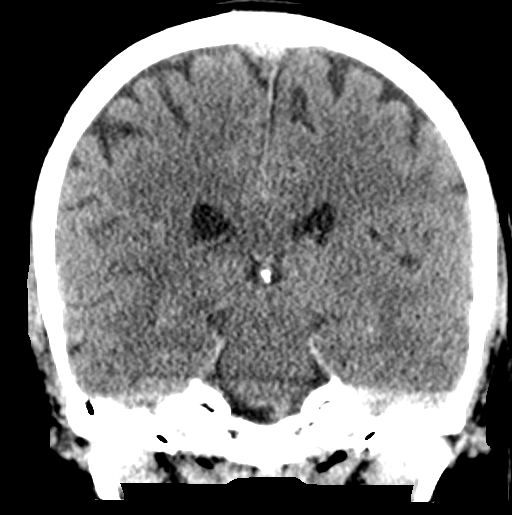
[im 39/70  brain]
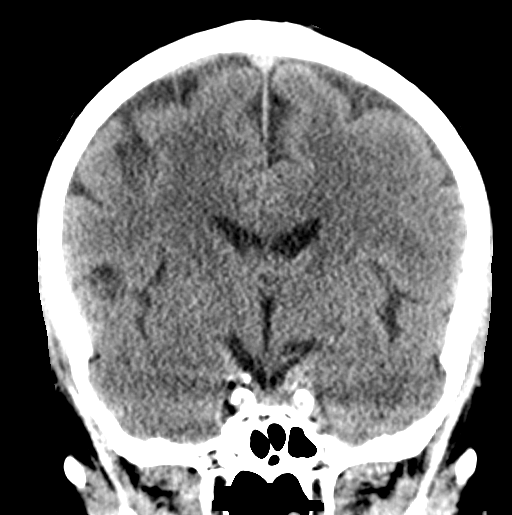

[Series 6: sagittal soft tissue · sagittal · 0.32mm/px · 3 of 54 slices shown]
[im 18/54  brain]
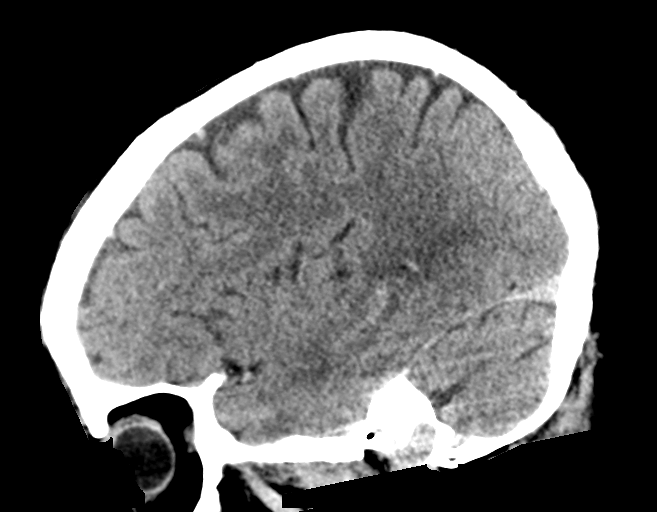
[im 27/54  brain]
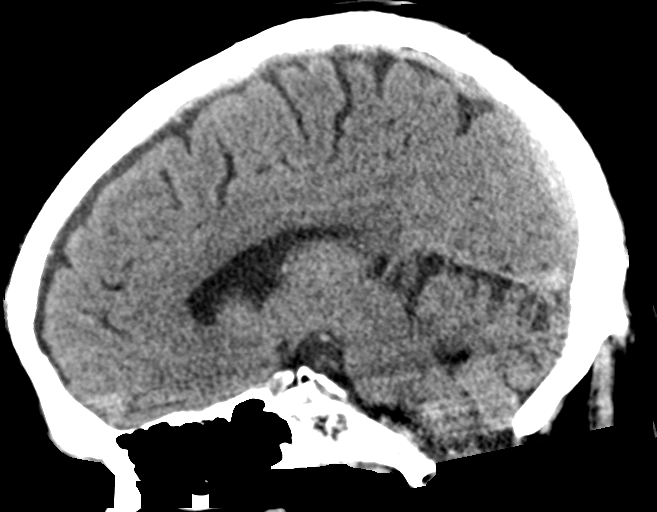
[im 36/54  brain]
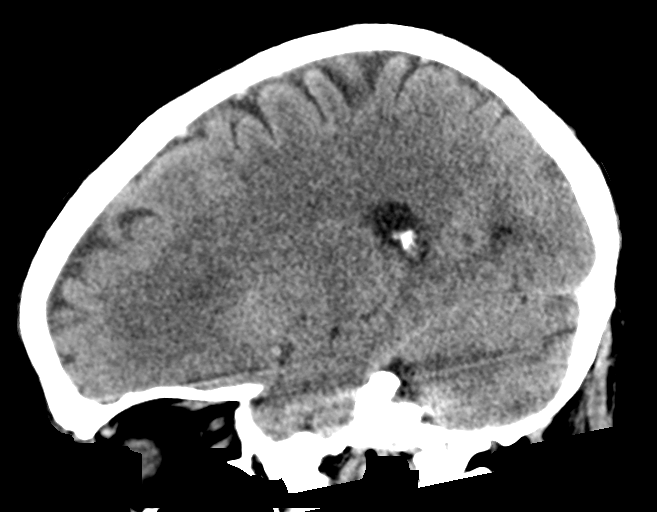

[15 of 47 positions shown; findings below may reference images not displayed]

FINDINGS: Brain: Hypoattenuation in the left inferior cerebellar hemisphere is
age indeterminate though given patient's symptoms could reflect a
subacute infarct. No other CT evident areas of infarction are
identified. No evidence of hemorrhage, hydrocephalus, extra-axial
collection, visible mass lesion or mass effect. Patchy areas of
white matter hypoattenuation are most compatible with chronic
microvascular angiopathy.

Vascular: Atherosclerotic calcification of the carotid siphons and
intradural vertebral arteries. No hyperdense vessel.

Skull: No calvarial fracture or suspicious osseous lesion. No scalp
swelling or hematoma.

Sinuses/Orbits: Paranasal sinuses and mastoid air cells are
predominantly clear.

Other: None.
IMPRESSION: 1. Hypoattenuation in the left inferior cerebellar hemisphere is
worrisome for acute to subacute infarct in the given clinical
setting.
2. No acute intracranial hemorrhage.
3. Intracranial atherosclerosis and features of chronic
microvascular angiopathy.

Critical Value/emergent results were called by telephone at the time
of interpretation on [DATE] at [DATE] to provider CHIBAN
, who verbally acknowledged these results.

## 2020-04-01 IMAGING — MR MR MRA NECK WO/W CM
4 of 6 series · 30 of 48 positions shown · IV contrast (gadavist)
Comparison: None.

CLINICAL DATA: Dizziness and blurred vision

EXAM:
MR HEAD WITHOUT CONTRAST
MR CIRCLE OF WILLIS WITHOUT CONTRAST
MRA OF THE NECK WITHOUT AND WITH CONTRAST
TECHNIQUE: Multiplanar, multiecho pulse sequences of the brain, circle of
willis and surrounding structures were obtained without intravenous
contrast. Angiographic images of the neck were obtained using MRA
technique without and with intravenous contrast.
CONTRAST:  8mL GADAVIST GADOBUTROL 1 MMOL/ML IV SOLN

[Series 22: tof_2d_tra · axial · 3.5mm · 0.43mm/px · z∈[-235,-42]mm · 8 of 80 slices shown]
[im 1/80]
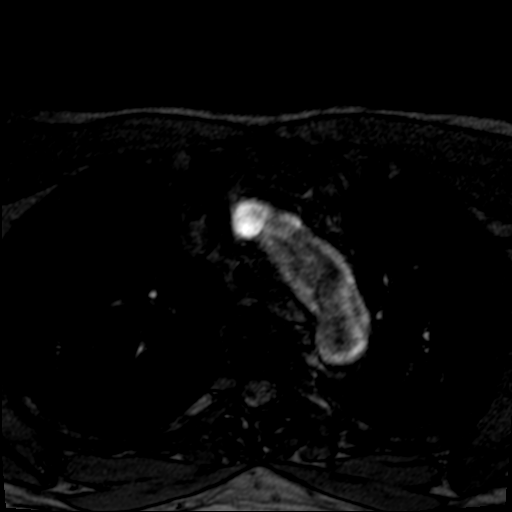
[im 12/80]
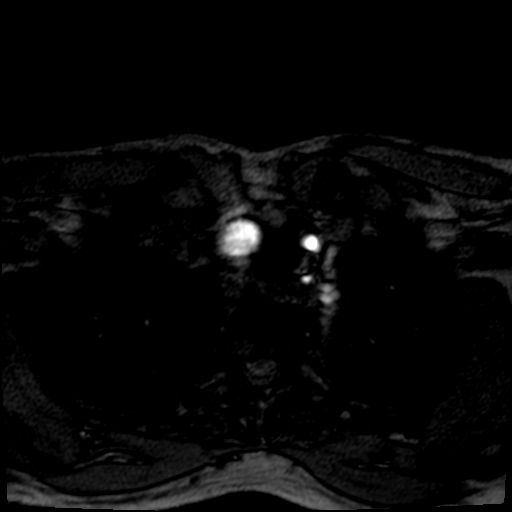
[im 23/80]
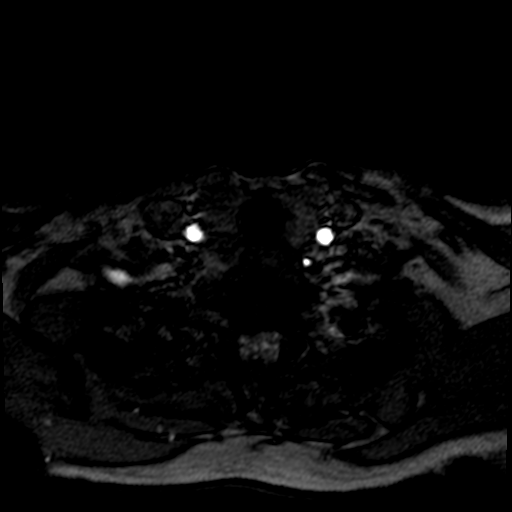
[im 34/80]
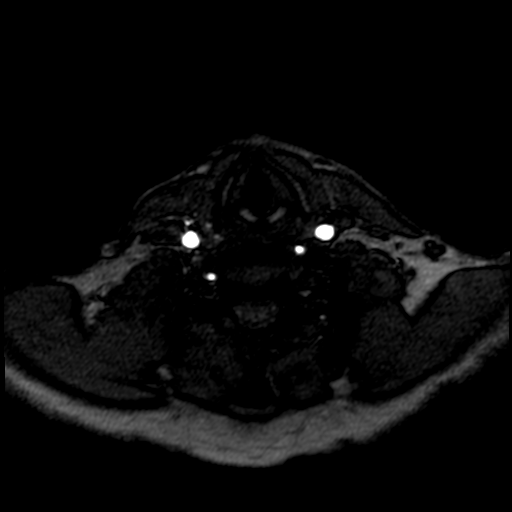
[im 46/80]
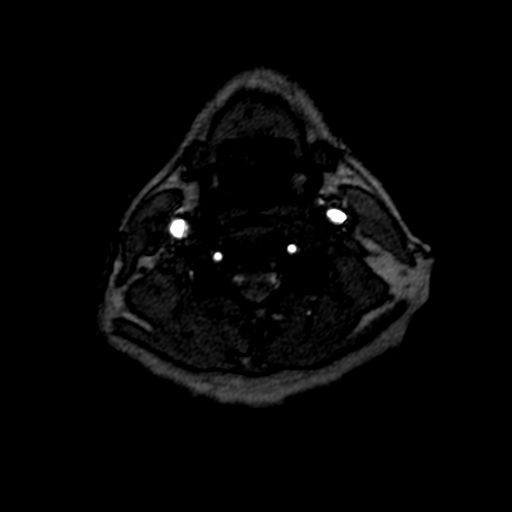
[im 57/80]
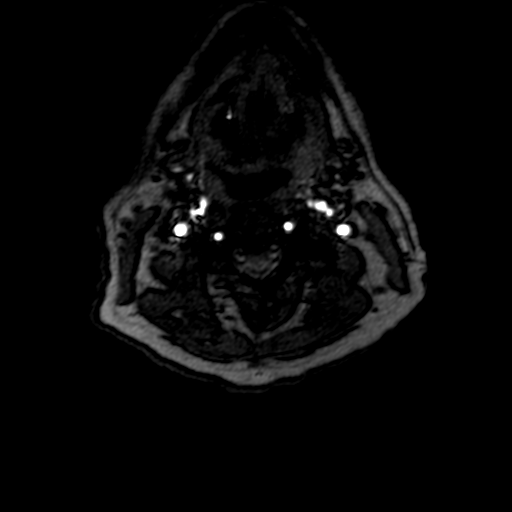
[im 68/80]
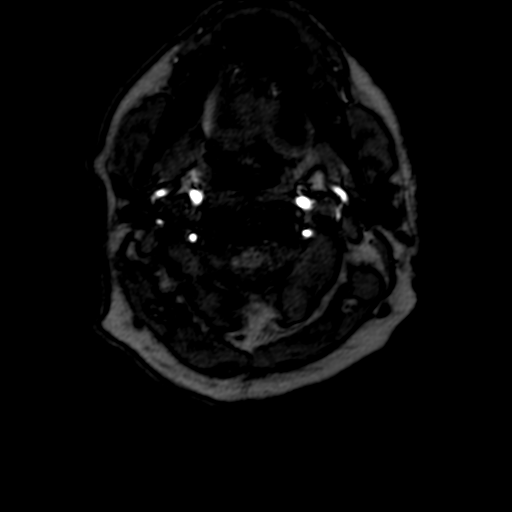
[im 80/80]
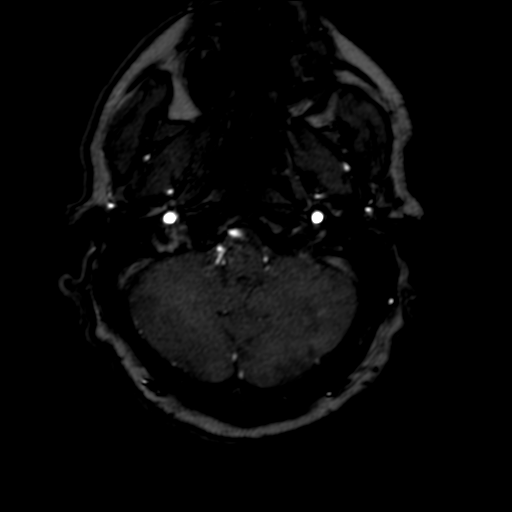

[Series 26: (id)_cor_pre · coronal · 0.8mm · 0.78mm/px · 8 of 96 slices shown]
[im 1/96]
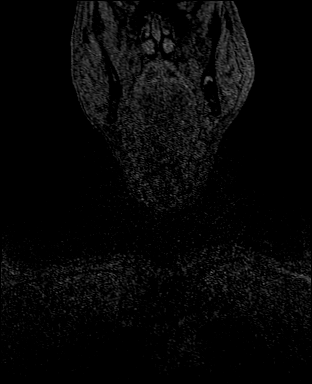
[im 14/96]
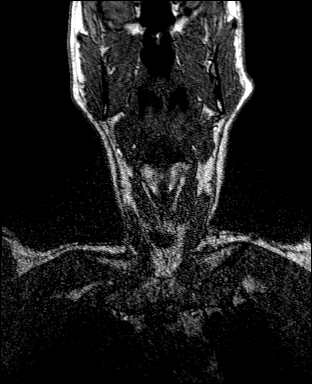
[im 28/96]
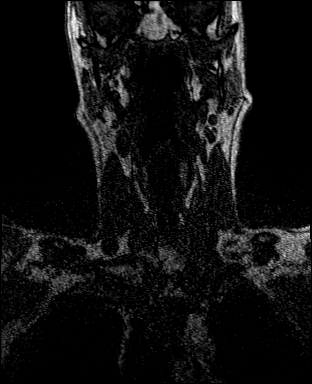
[im 41/96]
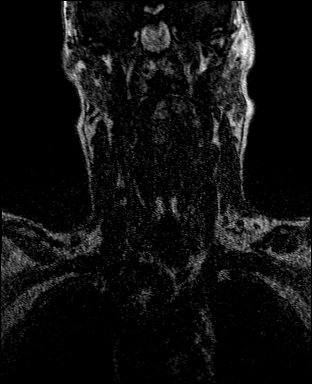
[im 55/96]
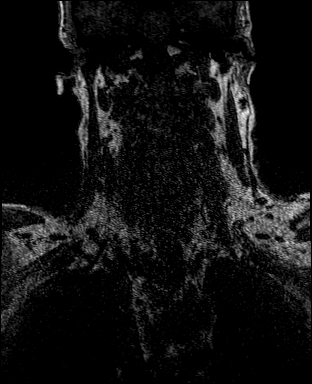
[im 68/96]
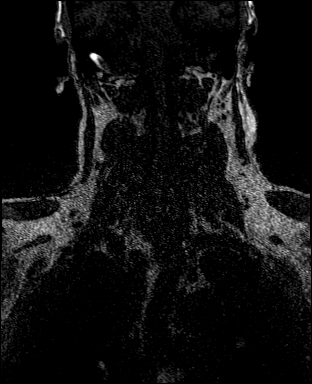
[im 82/96]
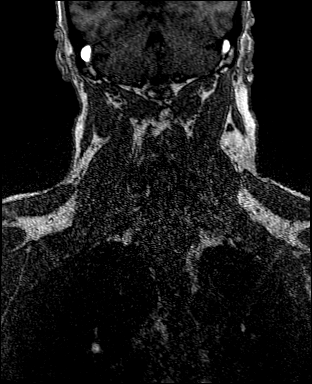
[im 96/96]
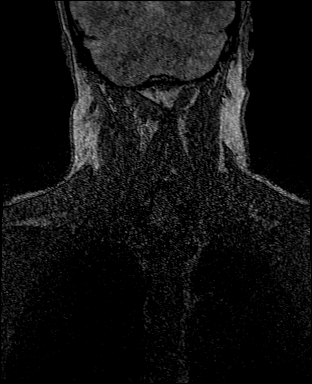

[Series 28: (id)_cor_post · coronal · 0.8mm · 0.78mm/px · 8 of 89 slices shown]
[im 1/89]
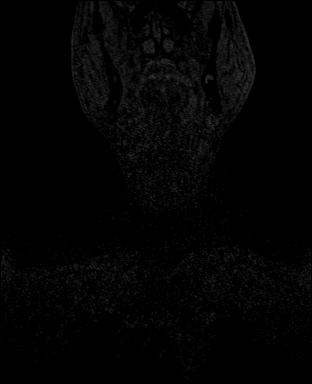
[im 13/89]
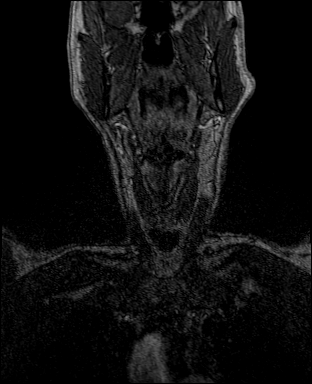
[im 26/89]
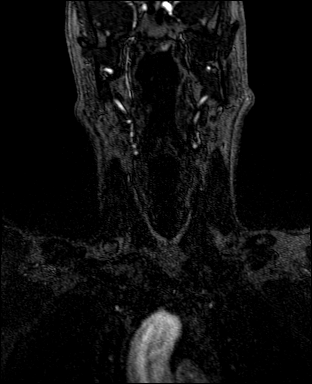
[im 38/89]
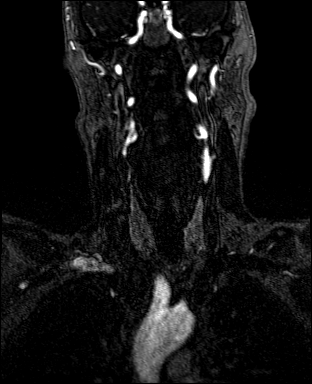
[im 51/89]
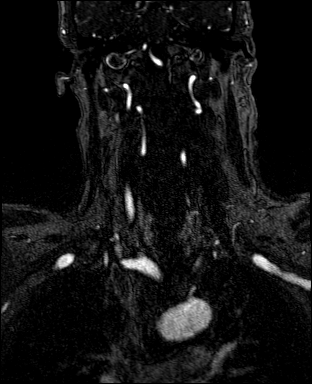
[im 63/89]
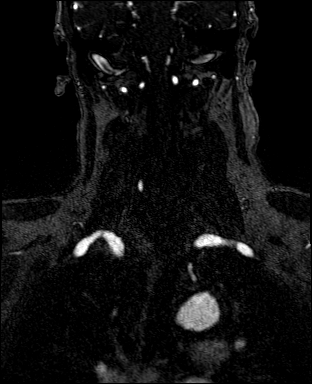
[im 76/89]
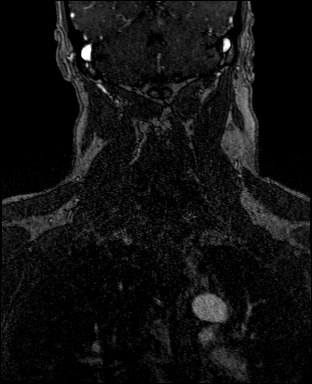
[im 89/89]
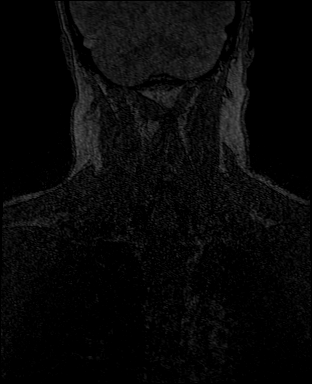

[Series 29: (id)_cor_post_venous · coronal · 0.8mm · 0.78mm/px · 6 of 96 slices shown]
[im 1/96]
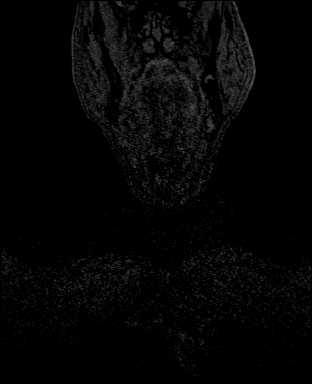
[im 14/96]
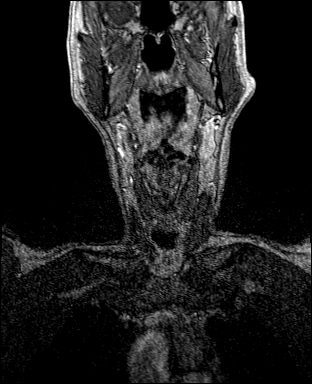
[im 28/96]
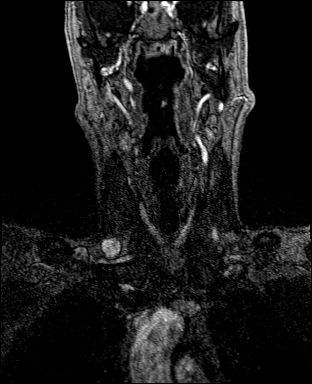
[im 41/96]
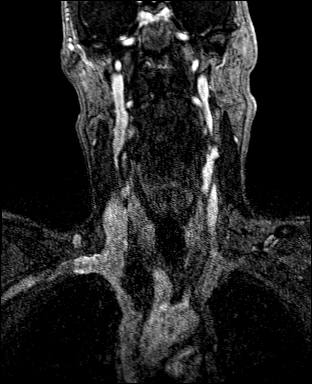
[im 55/96]
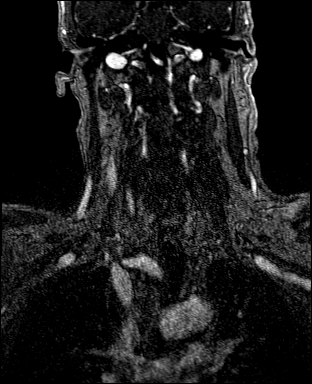
[im 82/96]
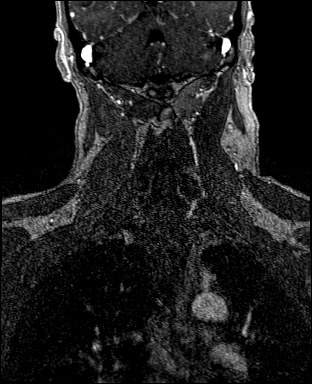

[30 of 48 positions shown; findings below may reference images not displayed]

FINDINGS: MR HEAD FINDINGS

Brain: Bilateral posterior circulation acute ischemia with a
relatively large left PICA territory infarct. Single punctate focus
of supratentorial acute ischemia in the left occipital lobe.
Multifocal hyperintense T2-weighted signal within the white matter.
Normal volume of CSF spaces. No chronic microhemorrhage. Normal
midline structures.

Vascular: Normal flow voids.

Skull and upper cervical spine: Normal marrow signal.

Sinuses/Orbits: Negative.

Other: None.

MR CIRCLE OF WILLIS FINDINGS

POSTERIOR CIRCULATION:

--Vertebral arteries: Moderate-to-severe stenosis of both V4
segments.

--Inferior cerebellar arteries: Normal.

--Basilar artery: Normal.

--Superior cerebellar arteries: Normal proximally

--Posterior cerebral arteries: Suspect occlusion or stenosis of the
distal left P3 segment. Normal right PCA.

ANTERIOR CIRCULATION:

--Intracranial internal carotid arteries: Normal.

--Anterior cerebral arteries (ACA): Normal. Both A1 segments are
present. Patent anterior communicating artery (a-comm).

--Middle cerebral arteries (MCA): Normal.

MRA NECK FINDINGS

Short segment moderate stenosis of the proximal left external
carotid artery. Otherwise normal carotid and vertebral arteries.
IMPRESSION: 1. Bilateral posterior circulation acute ischemia with a relatively
large left PICA territory infarct. No hemorrhage or mass effect.
2. Suspect occlusion or stenosis of the distal left P3 segment.
3. Moderate-to-severe stenosis of both vertebral artery V4 segments.
4. Short segment moderate stenosis of the proximal left external
carotid artery.

## 2020-04-01 IMAGING — MR MR MRA HEAD W/O CM
1 series · 19 of 48 positions shown · IV contrast (gadavist)
Comparison: None.

CLINICAL DATA: Dizziness and blurred vision

EXAM:
MR HEAD WITHOUT CONTRAST
MR CIRCLE OF WILLIS WITHOUT CONTRAST
MRA OF THE NECK WITHOUT AND WITH CONTRAST
TECHNIQUE: Multiplanar, multiecho pulse sequences of the brain, circle of
willis and surrounding structures were obtained without intravenous
contrast. Angiographic images of the neck were obtained using MRA
technique without and with intravenous contrast.
CONTRAST:  8mL GADAVIST GADOBUTROL 1 MMOL/ML IV SOLN

[Series 12: TOF · axial · 0.6mm · 0.35mm/px · z∈[-21,+63]mm · 19 of 152 slices shown]
[im 1/152]
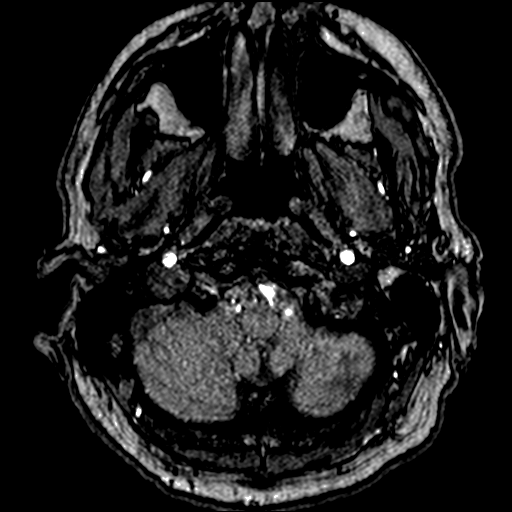
[im 4/152]
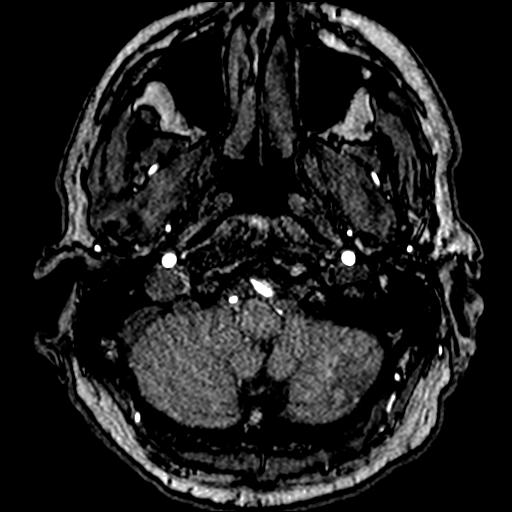
[im 7/152]
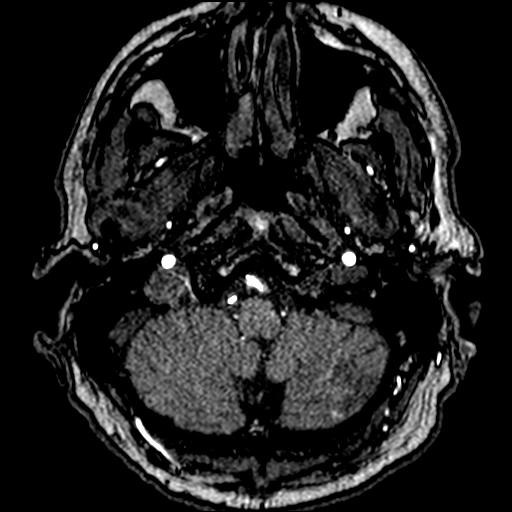
[im 10/152]
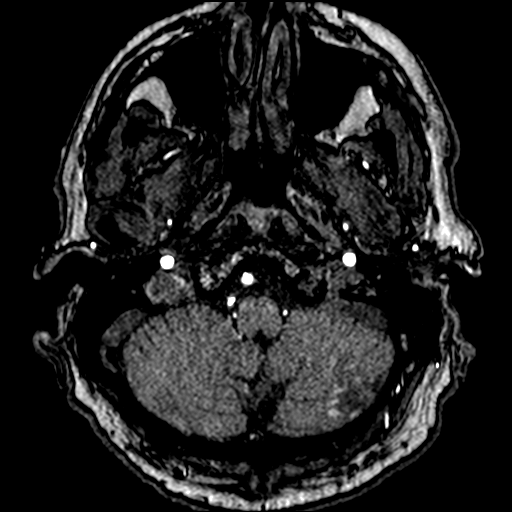
[im 13/152]
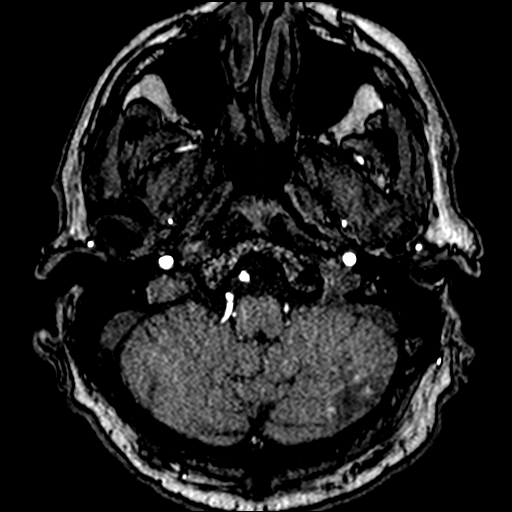
[im 17/152]
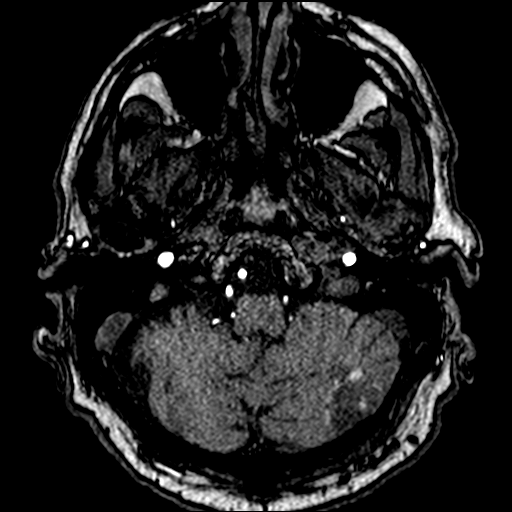
[im 20/152]
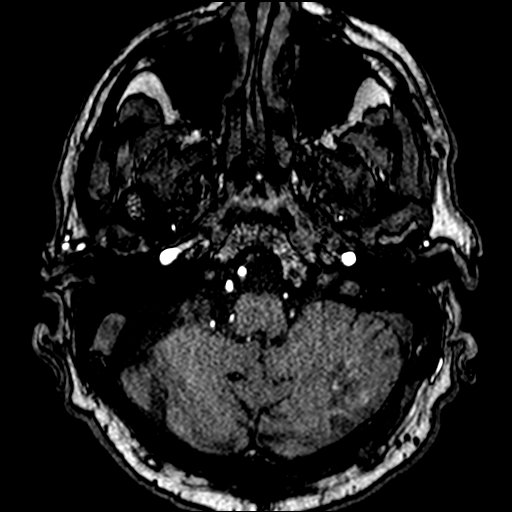
[im 23/152]
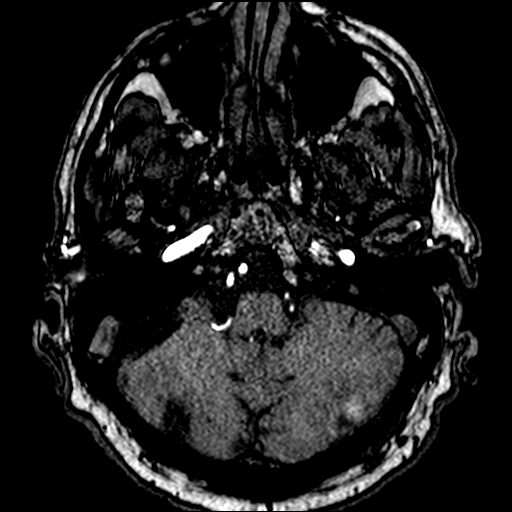
[im 26/152]
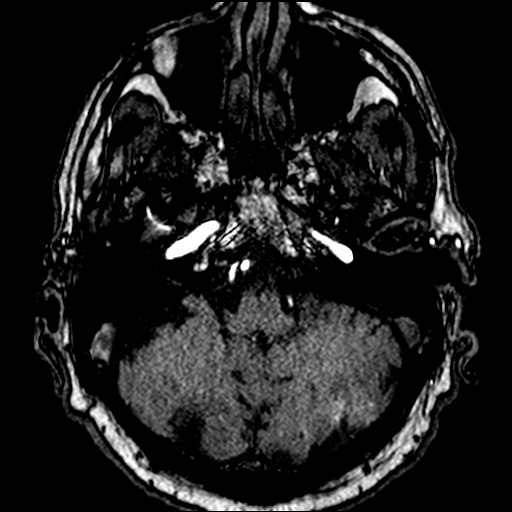
[im 29/152]
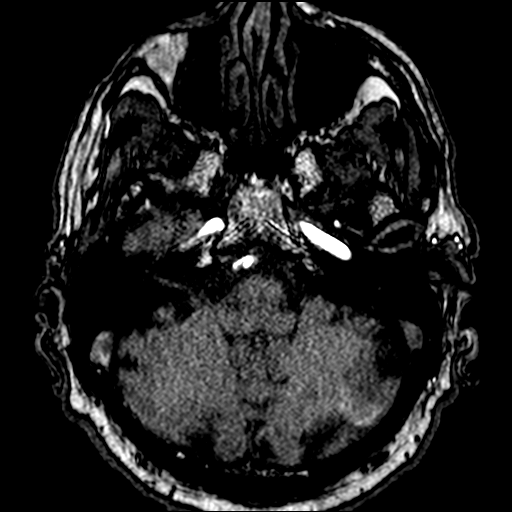
[im 33/152]
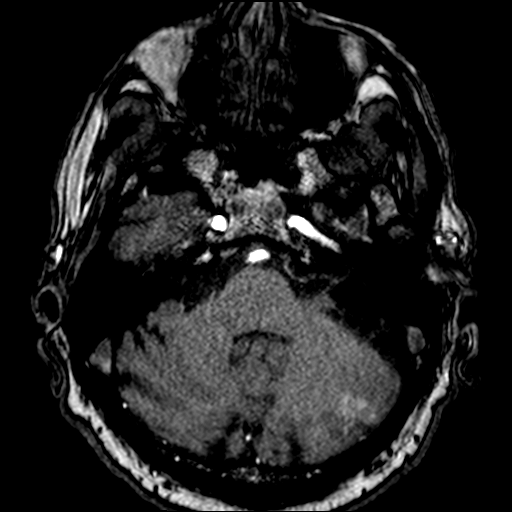
[im 49/152]
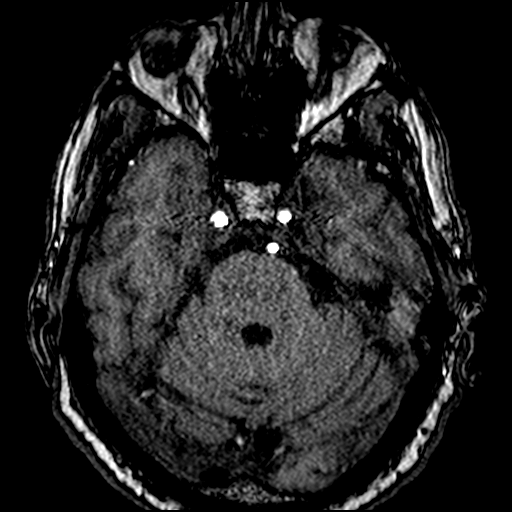
[im 68/152]
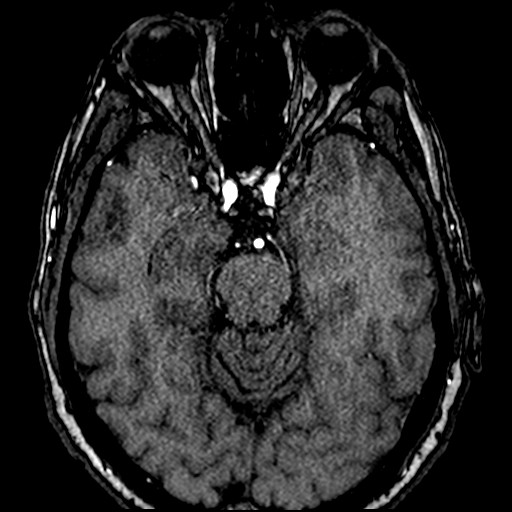
[im 78/152]
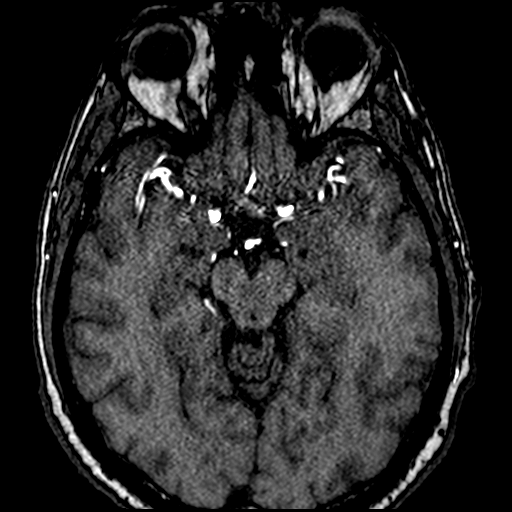
[im 87/152]
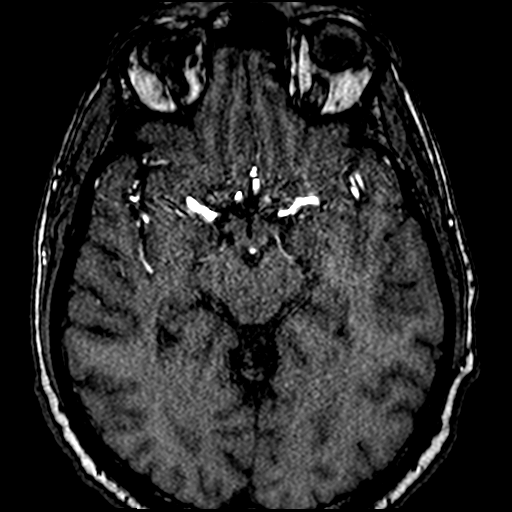
[im 107/152]
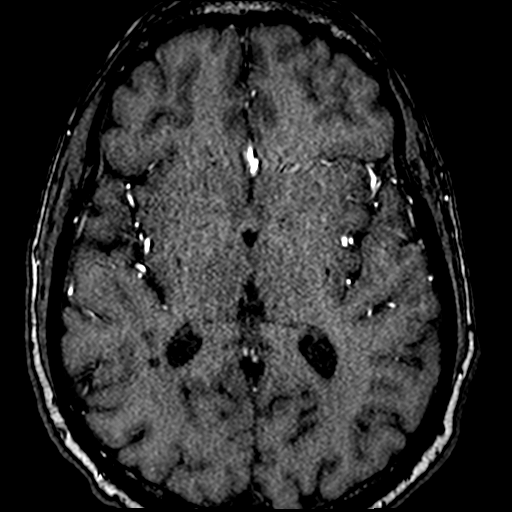
[im 126/152]
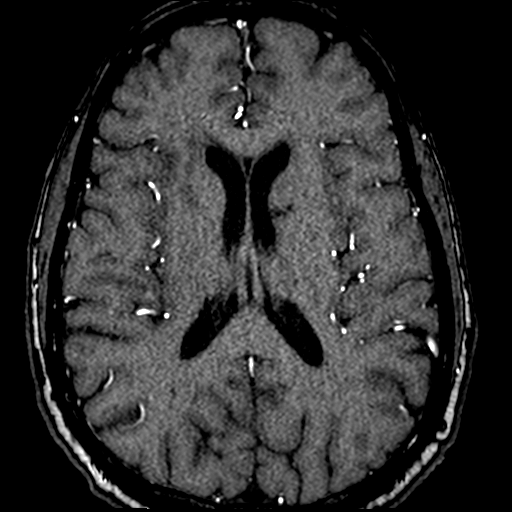
[im 129/152]
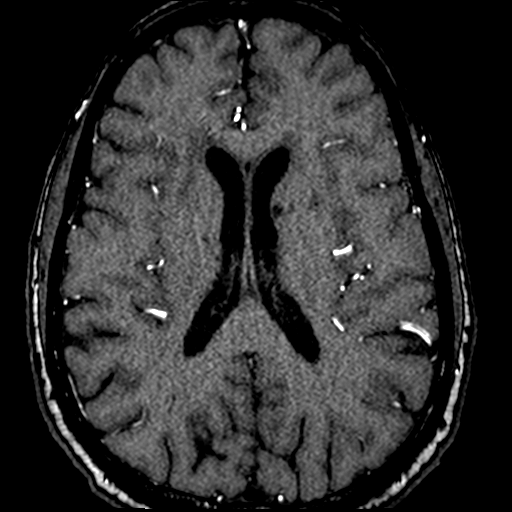
[im 145/152]
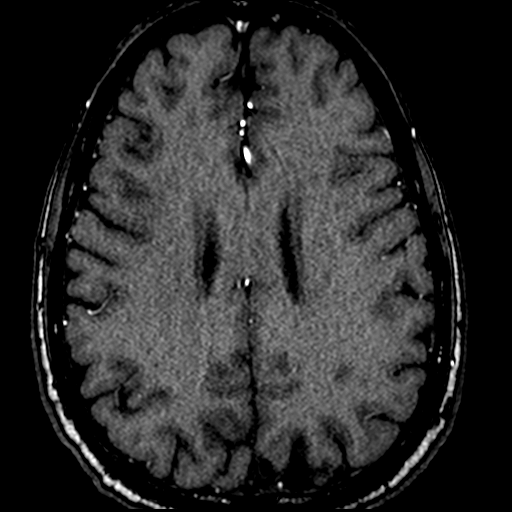

[19 of 48 positions shown; findings below may reference images not displayed]

FINDINGS: MR HEAD FINDINGS

Brain: Bilateral posterior circulation acute ischemia with a
relatively large left PICA territory infarct. Single punctate focus
of supratentorial acute ischemia in the left occipital lobe.
Multifocal hyperintense T2-weighted signal within the white matter.
Normal volume of CSF spaces. No chronic microhemorrhage. Normal
midline structures.

Vascular: Normal flow voids.

Skull and upper cervical spine: Normal marrow signal.

Sinuses/Orbits: Negative.

Other: None.

MR CIRCLE OF WILLIS FINDINGS

POSTERIOR CIRCULATION:

--Vertebral arteries: Moderate-to-severe stenosis of both V4
segments.

--Inferior cerebellar arteries: Normal.

--Basilar artery: Normal.

--Superior cerebellar arteries: Normal proximally

--Posterior cerebral arteries: Suspect occlusion or stenosis of the
distal left P3 segment. Normal right PCA.

ANTERIOR CIRCULATION:

--Intracranial internal carotid arteries: Normal.

--Anterior cerebral arteries (ACA): Normal. Both A1 segments are
present. Patent anterior communicating artery (a-comm).

--Middle cerebral arteries (MCA): Normal.

MRA NECK FINDINGS

Short segment moderate stenosis of the proximal left external
carotid artery. Otherwise normal carotid and vertebral arteries.
IMPRESSION: 1. Bilateral posterior circulation acute ischemia with a relatively
large left PICA territory infarct. No hemorrhage or mass effect.
2. Suspect occlusion or stenosis of the distal left P3 segment.
3. Moderate-to-severe stenosis of both vertebral artery V4 segments.
4. Short segment moderate stenosis of the proximal left external
carotid artery.

## 2020-04-01 MED ORDER — GADOBUTROL 1 MMOL/ML IV SOLN
8.0000 mL | Freq: Once | INTRAVENOUS | Status: AC | PRN
  Administered 2020-04-01: 8 mL via INTRAVENOUS

## 2020-04-01 MED ORDER — ASPIRIN EC 81 MG PO TBEC
81.0000 mg | DELAYED_RELEASE_TABLET | Freq: Once | ORAL | Status: AC
  Administered 2020-04-01: 81 mg via ORAL
  Filled 2020-04-01: qty 1

## 2020-04-01 MED ORDER — CLOPIDOGREL BISULFATE 300 MG PO TABS
300.0000 mg | ORAL_TABLET | Freq: Once | ORAL | Status: AC
  Administered 2020-04-01: 300 mg via ORAL
  Filled 2020-04-01: qty 1

## 2020-04-01 MED ORDER — CLOPIDOGREL BISULFATE 75 MG PO TABS: 75.0000 mg | ORAL_TABLET | Freq: Every day | ORAL | 0 refills | Status: DC

## 2020-04-01 NOTE — ED Provider Notes (Signed)
[facility] DEPT Provider Note   CSN: [id number] Arrival date & time: 04/01/20  1153     History Chief Complaint  Patient presents with  . Dizziness    Caleb Cardenas is a 64 y.o. male.  Patient complains of dizziness that sporadic for the last couple weeks. Sometimes he feels like he is walking to his left.   Dizziness      Past Medical History:  Diagnosis Date  . Hypertension     There are no problems to display for this patient.   History reviewed. No pertinent surgical history.     No family history on file.  Social History   Tobacco Use  . Smoking status: Current Every Day Smoker  . Smokeless tobacco: Never Used  Substance Use Topics  . Alcohol use: Yes    Comment: occ  . Drug use: No    Home Medications Prior to Admission medications   Medication Sig Start Date End Date Taking? Authorizing Provider  Fructose-Dextrose-Phosphor Acd (NAUSEA CONTROL PO) Take 1 tablet by mouth daily as needed (nausea and vomiting).   Yes [provider]  lisinopril-hydrochlorothiazide (PRINZIDE,ZESTORETIC) 10-12.5 MG tablet Take 1 tablet by mouth daily.   Yes [provider]  Multiple Vitamin (MULTIVITAMIN ADULT) TABS Take 1 tablet by mouth 3 (three) times a week.   Yes [provider]  clopidogrel (PLAVIX) 75 MG tablet Take 1 tablet (75 mg total) by mouth daily. 04/01/20   Milton Ferguson, MD  traMADol (ULTRAM) 50 MG tablet Take 1 tablet (50 mg total) by mouth every 6 (six) hours as needed. Patient not taking: Reported on 04/01/2020 10/06/15   Carmin Muskrat, MD    Allergies    Patient has no known allergies.  Review of Systems   Review of Systems  Neurological: Positive for dizziness.    Physical Exam Updated Vital Signs BP 125/85 (BP Location: Right Arm)   Pulse 65   Temp 98.8 F (37.1 C) (Oral)   Resp 16   Ht 5\' 10"  (1.778 m)   Wt 77.1 kg   SpO2 100%   BMI 24.39 kg/m   Physical Exam  ED  Results / Procedures / Treatments   Labs (all labs ordered are listed, but only abnormal results are displayed) Labs Reviewed  BASIC METABOLIC PANEL - Abnormal; Notable for the following components:      Result Value   Glucose, Bld 106 (*)    BUN 25 (*)    Creatinine, Ser 1.59 (*)    Calcium 8.8 (*)    GFR, Estimated 45 (*)    All other components within normal limits  CBG MONITORING, ED - Abnormal; Notable for the following components:   Glucose-Capillary 114 (*)    All other components within normal limits  CBC  URINALYSIS, ROUTINE W REFLEX MICROSCOPIC    EKG EKG Interpretation  Date/Time:  Wednesday April 01 2020 12:20:46 EDT Ventricular Rate:  66 PR Interval:    QRS Duration: 97 QT Interval:  376 QTC Calculation: 394 R Axis:   68 Text Interpretation: Sinus rhythm Ventricular premature complex Borderline repolarization abnormality 12 Lead; Mason-Likar Confirmed by Quintella Reichert [phone]) on 04/01/2020 1:16:29 PM Also confirmed by Milton Ferguson [phone])  on 04/01/2020 4:04:23 PM   Radiology CT Head Wo Contrast  Result Date: 04/01/2020 CLINICAL DATA:  Dizziness for 2 weeks now with blurred vision EXAM: CT HEAD WITHOUT CONTRAST TECHNIQUE: Contiguous axial images were obtained from the base of the skull through the vertex without  intravenous contrast. COMPARISON:  None. FINDINGS: Brain: Hypoattenuation in the left inferior cerebellar hemisphere is age indeterminate though given patient's symptoms could reflect a subacute infarct. No other CT evident areas of infarction are identified. No evidence of hemorrhage, hydrocephalus, extra-axial collection, visible mass lesion or mass effect. Patchy areas of white matter hypoattenuation are most compatible with chronic microvascular angiopathy. Vascular: Atherosclerotic calcification of the carotid siphons and intradural vertebral arteries. No hyperdense vessel. Skull: No calvarial fracture or suspicious osseous lesion. No scalp swelling  or hematoma. Sinuses/Orbits: Paranasal sinuses and mastoid air cells are predominantly clear. Other: None. IMPRESSION: 1. Hypoattenuation in the left inferior cerebellar hemisphere is worrisome for acute to subacute infarct in the given clinical setting. 2. No acute intracranial hemorrhage. 3. Intracranial atherosclerosis and features of chronic microvascular angiopathy. Critical Value/emergent results were called by telephone at the time of interpretation on 04/01/2020 at 5:37 pm to provider [facility] Sareen Randon , who verbally acknowledged these results. Electronically Signed   By: Lovena Le M.D.   On: 04/01/2020 17:37   MR ANGIO HEAD WO CONTRAST  Result Date: 04/01/2020 CLINICAL DATA:  Dizziness and blurred vision EXAM: MR HEAD WITHOUT CONTRAST MR CIRCLE OF WILLIS WITHOUT CONTRAST MRA OF THE NECK WITHOUT AND WITH CONTRAST TECHNIQUE: Multiplanar, multiecho pulse sequences of the brain, circle of willis and surrounding structures were obtained without intravenous contrast. Angiographic images of the neck were obtained using MRA technique without and with intravenous contrast. CONTRAST:  78mL GADAVIST GADOBUTROL 1 MMOL/ML IV SOLN COMPARISON:  None. FINDINGS: MR HEAD FINDINGS Brain: Bilateral posterior circulation acute ischemia with a relatively large left PICA territory infarct. Single punctate focus of supratentorial acute ischemia in the left occipital lobe. Multifocal hyperintense T2-weighted signal within the white matter. Normal volume of CSF spaces. No chronic microhemorrhage. Normal midline structures. Vascular: Normal flow voids. Skull and upper cervical spine: Normal marrow signal. Sinuses/Orbits: Negative. Other: None. MR CIRCLE OF WILLIS FINDINGS POSTERIOR CIRCULATION: --Vertebral arteries: Moderate-to-severe stenosis of both V4 segments. --Inferior cerebellar arteries: Normal. --Basilar artery: Normal. --Superior cerebellar arteries: Normal proximally --Posterior cerebral arteries: Suspect occlusion or  stenosis of the distal left P3 segment. Normal right PCA. ANTERIOR CIRCULATION: --Intracranial internal carotid arteries: Normal. --Anterior cerebral arteries (ACA): Normal. Both A1 segments are present. Patent anterior communicating artery (a-comm). --Middle cerebral arteries (MCA): Normal. MRA NECK FINDINGS Short segment moderate stenosis of the proximal left external carotid artery. Otherwise normal carotid and vertebral arteries. IMPRESSION: 1. Bilateral posterior circulation acute ischemia with a relatively large left PICA territory infarct. No hemorrhage or mass effect. 2. Suspect occlusion or stenosis of the distal left P3 segment. 3. 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 4. Short segment moderate stenosis of the proximal left external carotid artery. Electronically Signed   By: Ulyses Jarred M.D.   On: 04/01/2020 21:24   MR Angiogram Neck W or Wo Contrast  Result Date: 04/01/2020 CLINICAL DATA:  Dizziness and blurred vision EXAM: MR HEAD WITHOUT CONTRAST MR CIRCLE OF WILLIS WITHOUT CONTRAST MRA OF THE NECK WITHOUT AND WITH CONTRAST TECHNIQUE: Multiplanar, multiecho pulse sequences of the brain, circle of willis and surrounding structures were obtained without intravenous contrast. Angiographic images of the neck were obtained using MRA technique without and with intravenous contrast. CONTRAST:  73mL GADAVIST GADOBUTROL 1 MMOL/ML IV SOLN COMPARISON:  None. FINDINGS: MR HEAD FINDINGS Brain: Bilateral posterior circulation acute ischemia with a relatively large left PICA territory infarct. Single punctate focus of supratentorial acute ischemia in the left occipital lobe. Multifocal hyperintense T2-weighted  signal within the white matter. Normal volume of CSF spaces. No chronic microhemorrhage. Normal midline structures. Vascular: Normal flow voids. Skull and upper cervical spine: Normal marrow signal. Sinuses/Orbits: Negative. Other: None. MR CIRCLE OF WILLIS FINDINGS POSTERIOR  CIRCULATION: --Vertebral arteries: Moderate-to-severe stenosis of both V4 segments. --Inferior cerebellar arteries: Normal. --Basilar artery: Normal. --Superior cerebellar arteries: Normal proximally --Posterior cerebral arteries: Suspect occlusion or stenosis of the distal left P3 segment. Normal right PCA. ANTERIOR CIRCULATION: --Intracranial internal carotid arteries: Normal. --Anterior cerebral arteries (ACA): Normal. Both A1 segments are present. Patent anterior communicating artery (a-comm). --Middle cerebral arteries (MCA): Normal. MRA NECK FINDINGS Short segment moderate stenosis of the proximal left external carotid artery. Otherwise normal carotid and vertebral arteries. IMPRESSION: 1. Bilateral posterior circulation acute ischemia with a relatively large left PICA territory infarct. No hemorrhage or mass effect. 2. Suspect occlusion or stenosis of the distal left P3 segment. 3. 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 4. Short segment moderate stenosis of the proximal left external carotid artery. Electronically Signed   By: Ulyses Jarred M.D.   On: 04/01/2020 21:24   MR BRAIN WO CONTRAST  Result Date: 04/01/2020 CLINICAL DATA:  Dizziness and blurred vision EXAM: MR HEAD WITHOUT CONTRAST MR CIRCLE OF WILLIS WITHOUT CONTRAST MRA OF THE NECK WITHOUT AND WITH CONTRAST TECHNIQUE: Multiplanar, multiecho pulse sequences of the brain, circle of willis and surrounding structures were obtained without intravenous contrast. Angiographic images of the neck were obtained using MRA technique without and with intravenous contrast. CONTRAST:  49mL GADAVIST GADOBUTROL 1 MMOL/ML IV SOLN COMPARISON:  None. FINDINGS: MR HEAD FINDINGS Brain: Bilateral posterior circulation acute ischemia with a relatively large left PICA territory infarct. Single punctate focus of supratentorial acute ischemia in the left occipital lobe. Multifocal hyperintense T2-weighted signal within the white matter. Normal  volume of CSF spaces. No chronic microhemorrhage. Normal midline structures. Vascular: Normal flow voids. Skull and upper cervical spine: Normal marrow signal. Sinuses/Orbits: Negative. Other: None. MR CIRCLE OF WILLIS FINDINGS POSTERIOR CIRCULATION: --Vertebral arteries: Moderate-to-severe stenosis of both V4 segments. --Inferior cerebellar arteries: Normal. --Basilar artery: Normal. --Superior cerebellar arteries: Normal proximally --Posterior cerebral arteries: Suspect occlusion or stenosis of the distal left P3 segment. Normal right PCA. ANTERIOR CIRCULATION: --Intracranial internal carotid arteries: Normal. --Anterior cerebral arteries (ACA): Normal. Both A1 segments are present. Patent anterior communicating artery (a-comm). --Middle cerebral arteries (MCA): Normal. MRA NECK FINDINGS Short segment moderate stenosis of the proximal left external carotid artery. Otherwise normal carotid and vertebral arteries. IMPRESSION: 1. Bilateral posterior circulation acute ischemia with a relatively large left PICA territory infarct. No hemorrhage or mass effect. 2. Suspect occlusion or stenosis of the distal left P3 segment. 3. 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 4. Short segment moderate stenosis of the proximal left external carotid artery. Electronically Signed   By: Ulyses Jarred M.D.   On: 04/01/2020 21:24    Procedures Procedures (including critical care time)  Medications Ordered in ED Medications  clopidogrel (PLAVIX) tablet 300 mg (has no administration in time range)  aspirin EC tablet 81 mg (has no administration in time range)  gadobutrol (GADAVIST) 1 MMOL/ML injection 8 mL (8 mLs Intravenous Contrast Given 04/01/20 2109)   CRITICAL CARE Performed by: Milton Ferguson Total critical care time: 45 minutes Critical care time was exclusive of separately billable procedures and treating other patients. Critical care was necessary to treat or prevent imminent or  life-threatening deterioration. Critical care was time spent personally by me on the following activities: development  of treatment plan with patient and/or surrogate as well as nursing, discussions with consultants, evaluation of patient's response to treatment, examination of patient, obtaining history from patient or surrogate, ordering and performing treatments and interventions, ordering and review of laboratory studies, ordering and review of radiographic studies, pulse oximetry and re-evaluation of patient's condition.  ED Course  I have reviewed the triage vital signs and the nursing notes.  Pertinent labs & imaging results that were available during my care of the patient were reviewed by me and considered in my medical decision making (see chart for details). .MDM Rules/Calculators/A&P                          Patient with a stroke that most likely occurred 2 weeks ago. I spoke to neurology and after the MRI and MRA it was decided the patient could be discharged home on Plavix and aspirin and follow-up with his PCP Final Clinical Impression(s) / ED Diagnoses Final diagnoses:  Dizziness    Rx / DC Orders ED Discharge Orders         Ordered    clopidogrel (PLAVIX) 75 MG tablet  Daily        04/01/20 2236           Milton Ferguson, MD 04/03/20 1142

## 2020-04-01 NOTE — ED Triage Notes (Signed)
Dizziness x 2 weeks, has been to UC twice without dx. Become dizzy taking a bath yesterday with blurred vision.

## 2020-04-01 NOTE — ED Notes (Signed)
Patient transported to MRI 

## 2020-04-01 NOTE — Discharge Instructions (Signed)
Take 1 baby aspirin a day for 1 week. Then stop taking the aspirin but continue taking the Plavix the whole time. Follow-up with [facility] neurology in the next couple weeks

## 2020-04-20 ENCOUNTER — Encounter: Payer: Self-pay | Admitting: *Deleted

## 2020-04-21 ENCOUNTER — Other Ambulatory Visit: Payer: Self-pay

## 2020-04-21 ENCOUNTER — Encounter: Payer: Self-pay | Admitting: Diagnostic Neuroimaging

## 2020-04-21 ENCOUNTER — Ambulatory Visit: Payer: Medicare HMO | Admitting: Diagnostic Neuroimaging

## 2020-04-21 VITALS — BP 144/69 | HR 70 | Ht 70.0 in | Wt 167.2 lb

## 2020-04-21 DIAGNOSIS — I63112 Cerebral infarction due to embolism of left vertebral artery: Secondary | ICD-10-CM

## 2020-04-21 MED ORDER — CLOPIDOGREL BISULFATE 75 MG PO TABS: 75.0000 mg | ORAL_TABLET | Freq: Every day | ORAL | 4 refills | Status: DC

## 2020-04-21 NOTE — Patient Instructions (Addendum)
POSTERIOR CIRCULATION INFARCTS bilateral cerebellar; left >> right; embolic) - check echocardiogram / TTE; if negative, then TEE and loop recorder  - continue aspirin + plavix x 3 months; then continue plavix 75mg  daily alone  - check A1c, lipids  - daily physical activity / exercise (at least 15-30 minutes) - eat more plants / vegetables - increase social activities, brain stimulation, games, puzzles, hobbies, crafts, arts, music - aim for at least 7-8 hours sleep per night (or more) - avoid smoking and alcohol

## 2020-04-21 NOTE — Progress Notes (Signed)
[facility]  PATIENT: Caleb Cardenas DOB: Sep 30, 1955  REFERRING CLINICIAN: Milton Ferguson, MD HISTORY FROM: patient  REASON FOR VISIT: new consult    HISTORICAL  CHIEF COMPLAINT:  Chief Complaint  Patient presents with  . Dizziness    rm 6 New Pt, ED FU for dizziness, not feeling well    HISTORY OF PRESENT ILLNESS:   64 year old male with cigarette smoking, hypertension, here for evaluation of stroke.  Patient has had intermittent episodes of dizziness since September 2021.  He went to urgent care several times for these symptoms.  Symptoms suddenly worsened on 03/31/2020 when he became very dizzy and had blurred vision.  Patient went to the hospital on 04/01/2020 for evaluation.  MRI of the brain showed acute stroke in the left cerebellum, slightly in the right cerebellum and midline cerebellum.  Significant intracranial atherosclerosis also noted the bilateral vertebral arteries.  Patient was discharged home with follow-up in outpatient neurology.  Since that time symptoms have improved.  He has continued on aspirin and Plavix.  He still smoking half pack cigarettes per day but try to cut down.  He drinks 3 Mountain Dew's per day.  He has not followed up with PCP yet.   REVIEW OF SYSTEMS: Full 14 system review of systems performed and negative with exception of: As per HPI.  ALLERGIES: No Known Allergies  HOME MEDICATIONS: Outpatient Medications Prior to Visit  Medication Sig Dispense Refill  . aspirin EC 81 MG tablet Take 81 mg by mouth daily. Swallow whole.    . clopidogrel (PLAVIX) 75 MG tablet Take 1 tablet (75 mg total) by mouth daily. 30 tablet 0  . lisinopril-hydrochlorothiazide (PRINZIDE,ZESTORETIC) 10-12.5 MG tablet Take 1 tablet by mouth daily.    . Multiple Vitamin (MULTIVITAMIN ADULT) TABS Take 1 tablet by mouth 3 (three) times a week.    . Fructose-Dextrose-Phosphor Acd (NAUSEA CONTROL PO) Take 1 tablet by mouth daily as needed (nausea and  vomiting). (Patient not taking: Reported on 04/21/2020)    . traMADol (ULTRAM) 50 MG tablet Take 1 tablet (50 mg total) by mouth every 6 (six) hours as needed. (Patient not taking: Reported on 04/01/2020) 15 tablet 0   No facility-administered medications prior to visit.    PAST MEDICAL HISTORY: Past Medical History:  Diagnosis Date  . Blurred vision   . Dizziness   . Hypertension     PAST SURGICAL HISTORY: Past Surgical History:  Procedure Laterality Date  . KNEE SURGERY     x2  in high school    FAMILY HISTORY: Family History  Problem Relation Age of Onset  . Heart attack Mother   . Alzheimer's disease Father   . Pancreatic cancer Brother     SOCIAL HISTORY: Social History   Socioeconomic History  . Marital status: Divorced    Spouse name: Not on file  . Number of children: 1  . Years of education: Not on file  . Highest education level: Some college, no degree  Occupational History    Comment: house flipping, painting  Tobacco Use  . Smoking status: Current Every Day Smoker    Packs/day: 0.50    Types: Cigarettes  . Smokeless tobacco: Never Used  Substance and Sexual Activity  . Alcohol use: Yes    Comment: occ  . Drug use: No  . Sexual activity: Not on file  Other Topics Concern  . Not on file  Social History Narrative   Lives alone   Caffeine- sodas 2-3 a day  Social Determinants of Health   Financial Resource Strain:   . Difficulty of Paying Living Expenses: Not on file  Food Insecurity:   . Worried About Charity fundraiser in the Last Year: Not on file  . Ran Out of Food in the Last Year: Not on file  Transportation Needs:   . Lack of Transportation (Medical): Not on file  . Lack of Transportation (Non-Medical): Not on file  Physical Activity:   . Days of Exercise per Week: Not on file  . Minutes of Exercise per Session: Not on file  Stress:   . Feeling of Stress : Not on file  Social Connections:   . Frequency of Communication with  Friends and Family: Not on file  . Frequency of Social Gatherings with Friends and Family: Not on file  . Attends Religious Services: Not on file  . Active Member of Clubs or Organizations: Not on file  . Attends Archivist Meetings: Not on file  . Marital Status: Not on file  Intimate Partner Violence:   . Fear of Current or Ex-Partner: Not on file  . Emotionally Abused: Not on file  . Physically Abused: Not on file  . Sexually Abused: Not on file     PHYSICAL EXAM  GENERAL EXAM/CONSTITUTIONAL: Vitals:  Vitals:   04/21/20 1309  BP: (!) 144/69  Pulse: 70  Weight: 167 lb 3.2 oz (75.8 kg)  Height: 5\' 10"  (1.778 m)     Body mass index is 23.99 kg/m. Wt Readings from Last 3 Encounters:  04/21/20 167 lb 3.2 oz (75.8 kg)  04/01/20 170 lb (77.1 kg)     Patient is in no distress; well developed, nourished and groomed; neck is supple  CARDIOVASCULAR:  Examination of carotid arteries is normal; no carotid bruits  Regular rate and rhythm, no murmurs  Examination of peripheral vascular system by observation and palpation is normal  EYES:  Ophthalmoscopic exam of optic discs and posterior segments is normal; no papilledema or hemorrhages  No exam data present  MUSCULOSKELETAL:  Gait, strength, tone, movements noted in Neurologic exam below  NEUROLOGIC: MENTAL STATUS:  No flowsheet data found.  awake, alert, oriented to person, place and time  recent and remote memory intact  normal attention and concentration  language fluent, comprehension intact, naming intact  fund of knowledge appropriate  CRANIAL NERVE:   2nd - no papilledema on fundoscopic exam  2nd, 3rd, 4th, 6th - pupils equal and reactive to light, visual fields full to confrontation, extraocular muscles intact, no nystagmus  5th - facial sensation symmetric  7th - facial strength symmetric  8th - hearing intact  9th - palate elevates symmetrically, uvula midline  11th -  shoulder shrug symmetric  12th - tongue protrusion midline  MOTOR:   normal bulk and tone, full strength in the BUE, BLE  SENSORY:   normal and symmetric to light touch, temperature, vibration  COORDINATION:   finger-nose-finger, fine finger movements normal; EXCEPT SLIGHTLY SLOWER ON LEFT SIDE   REFLEXES:   deep tendon reflexes present and symmetric  GAIT/STATION:   narrow based gait     DIAGNOSTIC DATA (LABS, IMAGING, TESTING) - I reviewed patient records, labs, notes, testing and imaging myself where available.  Lab Results  Component Value Date   WBC 8.3 04/01/2020   HGB 16.1 04/01/2020   HCT 47.0 04/01/2020   MCV 91.1 04/01/2020   PLT 217 04/01/2020      Component Value Date/Time   NA 138 04/01/2020  1231   K 3.8 04/01/2020 1231   CL 104 04/01/2020 1231   CO2 23 04/01/2020 1231   GLUCOSE 106 (H) 04/01/2020 1231   BUN 25 (H) 04/01/2020 1231   CREATININE 1.59 (H) 04/01/2020 1231   CALCIUM 8.8 (L) 04/01/2020 1231   GFRNONAA 45 (L) 04/01/2020 1231   GFRAA 58 (L) 10/06/2015 1408   No results found for: CHOL, HDL, LDLCALC, LDLDIRECT, TRIG, CHOLHDL No results found for: HGBA1C No results found for: VITAMINB12 No results found for: TSH   04/01/20 MRI brain, MRA head / neck [I reviewed images myself and agree with interpretation. -VRP]  1. Bilateral posterior circulation acute ischemia with a relatively large left PICA territory infarct. No hemorrhage or mass effect. 2. Suspect occlusion or stenosis of the distal left P3 segment.  3. 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 4. Short segment moderate stenosis of the proximal left external carotid artery.    ASSESSMENT AND PLAN  64 y.o. year old male here with:   Dx:  1. 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 clopidogrel (PLAVIX) 75 MG tablet    Sig: Take 1 tablet (75 mg total) by mouth daily.    Dispense:  90 tablet    Refill:  4   Orders Placed This Encounter  Procedures  . Lipid Panel  . Hemoglobin A1c  . ECHOCARDIOGRAM COMPLETE BUBBLE STUDY   Return in about 6 months (around 10/19/2020) for with NP Frann Rider), virtual visit (15 min).  I reviewed images, labs, notes, records myself. I summarized findings and reviewed with patient, for this high risk condition (stroke) requiring high complexity decision making.    Penni Bombard, MD 94/09/9673, 9:16 PM Certified in Neurology, Neurophysiology and Neuroimaging  [facility] [address], [place], [place] [address] [phone]

## 2020-04-22 ENCOUNTER — Telehealth: Payer: Self-pay | Admitting: *Deleted

## 2020-04-22 LAB — HEMOGLOBIN A1C
Est. average glucose Bld gHb Est-mCnc: 126 mg/dL
Hgb A1c MFr Bld: 6 % — ABNORMAL HIGH (ref 4.8–5.6)

## 2020-04-22 LAB — LIPID PANEL
Chol/HDL Ratio: 8 ratio — ABNORMAL HIGH (ref 0.0–5.0)
Cholesterol, Total: 199 mg/dL (ref 100–199)
HDL: 25 mg/dL — ABNORMAL LOW (ref >39)
LDL Chol Calc (NIH): 76 mg/dL (ref 0–99)
Triglycerides: 621 mg/dL (ref 0–149)
VLDL Cholesterol Cal: 98 mg/dL — ABNORMAL HIGH (ref 5–40)

## 2020-04-22 NOTE — Telephone Encounter (Signed)
Called patient and advised him per Dr Leta Baptist stated his labs showed elevated triglycerides. Advised he start omega-3 fish oil pills (OTC). Follow up with PCP re: further triglyceride lowering treatments. A1c also slightly high. Recommend diet and exercise changes and PCP follow up. I advised a copy of his labs are being faxed to [facility] fo review and FU. He has taken fish oil in past,  verbalized agreement, understanding, appreciation. Labs successfully faxed.

## 2020-06-03 ENCOUNTER — Other Ambulatory Visit (HOSPITAL_COMMUNITY): Payer: Medicare HMO

## 2020-06-26 ENCOUNTER — Other Ambulatory Visit: Payer: Self-pay

## 2020-06-26 ENCOUNTER — Ambulatory Visit (HOSPITAL_COMMUNITY): Payer: Medicare HMO | Attending: Cardiovascular Disease

## 2020-06-26 DIAGNOSIS — R42 Dizziness and giddiness: Secondary | ICD-10-CM | POA: Diagnosis not present

## 2020-06-26 DIAGNOSIS — I10 Essential (primary) hypertension: Secondary | ICD-10-CM | POA: Diagnosis not present

## 2020-06-26 DIAGNOSIS — I63112 Cerebral infarction due to embolism of left vertebral artery: Secondary | ICD-10-CM | POA: Diagnosis not present

## 2020-06-26 DIAGNOSIS — I517 Cardiomegaly: Secondary | ICD-10-CM | POA: Diagnosis not present

## 2020-06-26 DIAGNOSIS — I351 Nonrheumatic aortic (valve) insufficiency: Secondary | ICD-10-CM | POA: Insufficient documentation

## 2020-06-26 LAB — ECHOCARDIOGRAM COMPLETE BUBBLE STUDY
Area-P 1/2: 2.76 cm2
S' Lateral: 3.1 cm

## 2020-08-17 ENCOUNTER — Telehealth: Payer: Self-pay | Admitting: *Deleted

## 2020-08-17 NOTE — Telephone Encounter (Signed)
Spoke with patient and informed him his echocardiogram was an unremarkable study. No major findings.  Patient verbalized understanding, appreciation.

## 2020-09-24 ENCOUNTER — Telehealth: Payer: Self-pay | Admitting: *Deleted

## 2020-09-24 ENCOUNTER — Encounter: Payer: Self-pay | Admitting: *Deleted

## 2020-09-24 NOTE — Telephone Encounter (Signed)
Received fax from [facility] re: clearance for colonoscopy. Patient has hx of stroke 03/2020, is on plavix therapy. Letter on MD desk for review, signature.

## 2020-09-24 NOTE — Telephone Encounter (Signed)
Surgical clearance form, letter signed and faxed.

## 2020-09-25 ENCOUNTER — Encounter (HOSPITAL_COMMUNITY): Payer: Self-pay | Admitting: Emergency Medicine

## 2020-09-25 ENCOUNTER — Emergency Department (HOSPITAL_COMMUNITY): Payer: Medicare HMO

## 2020-09-25 ENCOUNTER — Other Ambulatory Visit: Payer: Self-pay

## 2020-09-25 ENCOUNTER — Emergency Department (HOSPITAL_COMMUNITY)
Admission: EM | Admit: 2020-09-25 | Discharge: 2020-09-25 | Disposition: A | Discharge status: Home / Self Care | Payer: Medicare HMO | Attending: Emergency Medicine | Admitting: Emergency Medicine

## 2020-09-25 DIAGNOSIS — R1030 Lower abdominal pain, unspecified: Secondary | ICD-10-CM | POA: Insufficient documentation

## 2020-09-25 DIAGNOSIS — Z7982 Long term (current) use of aspirin: Secondary | ICD-10-CM | POA: Diagnosis not present

## 2020-09-25 DIAGNOSIS — Z79899 Other long term (current) drug therapy: Secondary | ICD-10-CM | POA: Diagnosis not present

## 2020-09-25 DIAGNOSIS — Z7902 Long term (current) use of antithrombotics/antiplatelets: Secondary | ICD-10-CM | POA: Diagnosis not present

## 2020-09-25 DIAGNOSIS — F17210 Nicotine dependence, cigarettes, uncomplicated: Secondary | ICD-10-CM | POA: Diagnosis not present

## 2020-09-25 DIAGNOSIS — N452 Orchitis: Secondary | ICD-10-CM | POA: Diagnosis not present

## 2020-09-25 DIAGNOSIS — I10 Essential (primary) hypertension: Secondary | ICD-10-CM | POA: Diagnosis not present

## 2020-09-25 DIAGNOSIS — N50812 Left testicular pain: Secondary | ICD-10-CM | POA: Diagnosis present

## 2020-09-25 LAB — URINALYSIS, ROUTINE W REFLEX MICROSCOPIC
Bilirubin Urine: NEGATIVE
Glucose, UA: NEGATIVE mg/dL
Ketones, ur: NEGATIVE mg/dL
Nitrite: NEGATIVE
Protein, ur: NEGATIVE mg/dL
Specific Gravity, Urine: 1.021 (ref 1.005–1.030)
pH: 5 (ref 5.0–8.0)

## 2020-09-25 IMAGING — US US SCROTUM W/ DOPPLER COMPLETE
1 series · 13 of 25 positions shown · non-contrast
Comparison: None.

CLINICAL DATA: 64-year-old with scrotal pain.  Concern for torsion.

EXAM:
SCROTAL ULTRASOUND
DOPPLER ULTRASOUND OF THE TESTICLES
TECHNIQUE: Complete ultrasound examination of the testicles, epididymis, and
other scrotal structures was performed. Color and spectral Doppler
ultrasound were also utilized to evaluate blood flow to the
testicles.

[Series 1: us scrotum w/ doppler complete · 88 acquisitions, 13 frames shown]
[im 1/88]
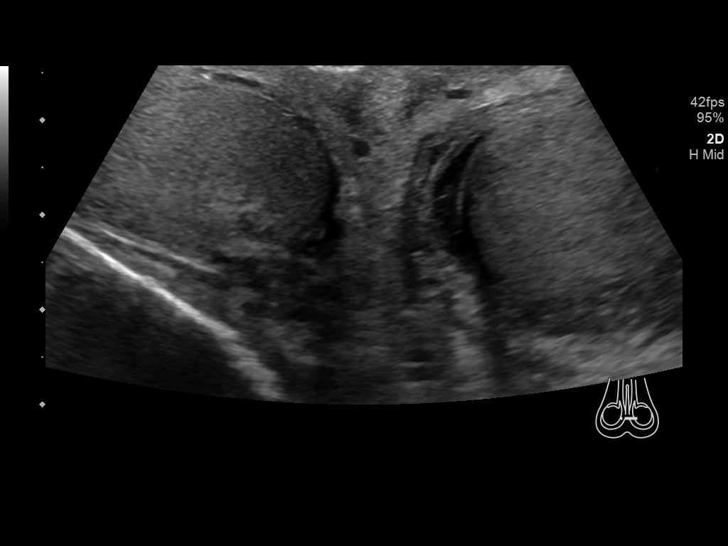
[im 8/88]
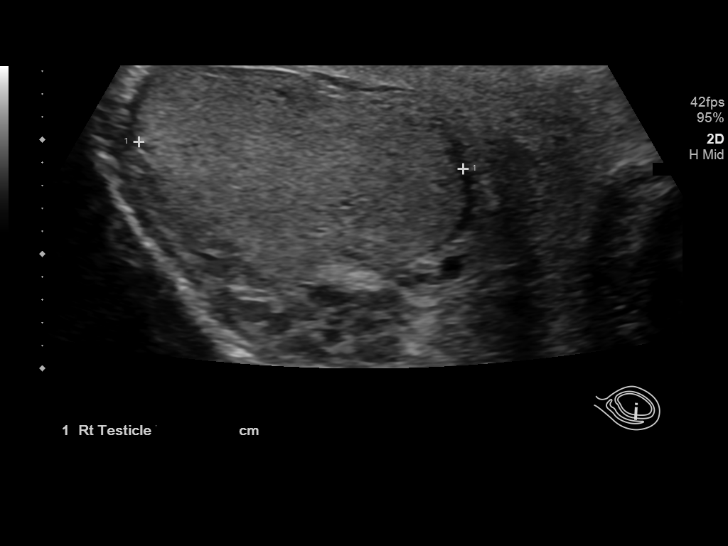
[im 15/88]
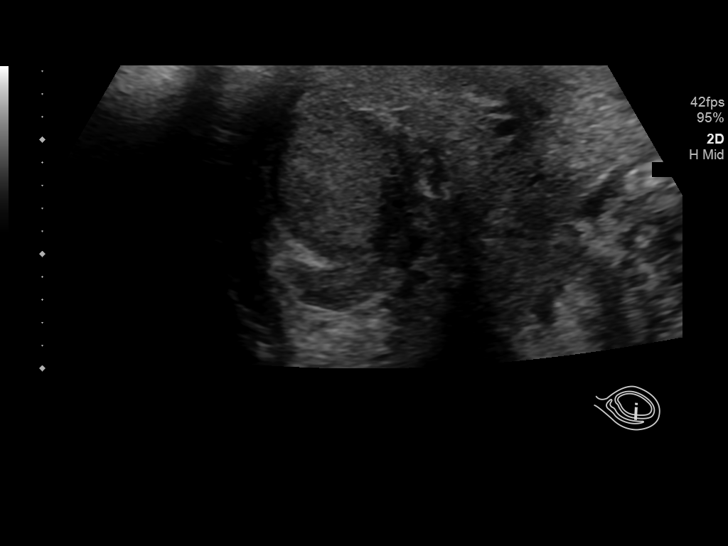
[im 22/88]
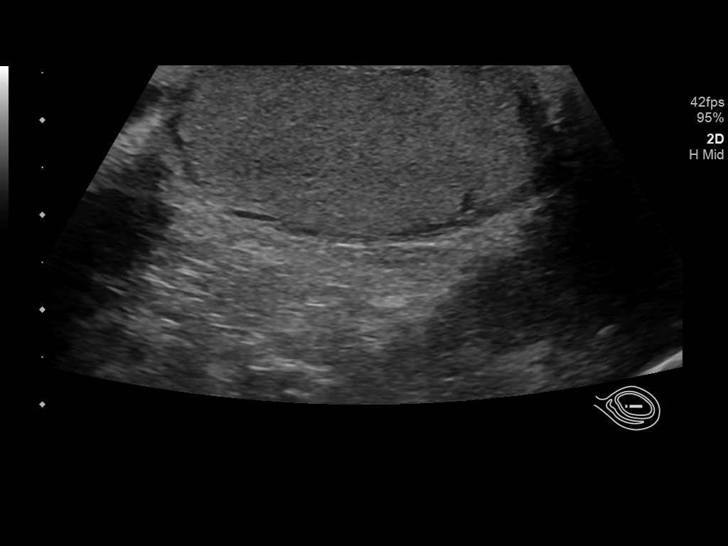
[im 30/88]
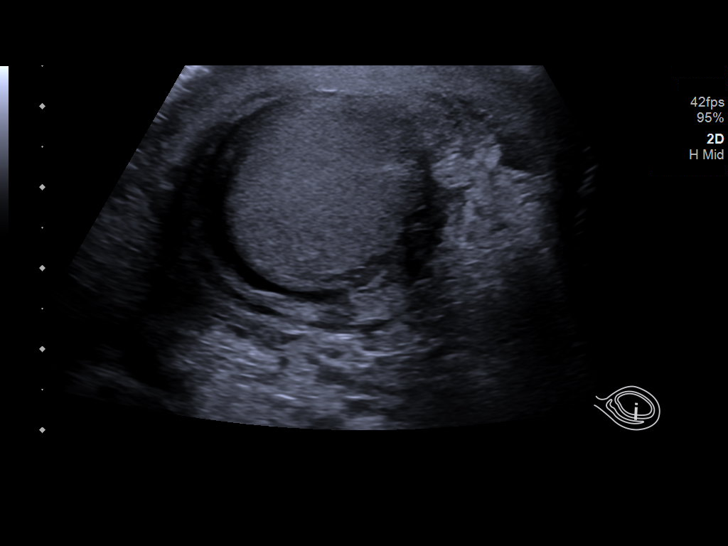
[im 37/88]
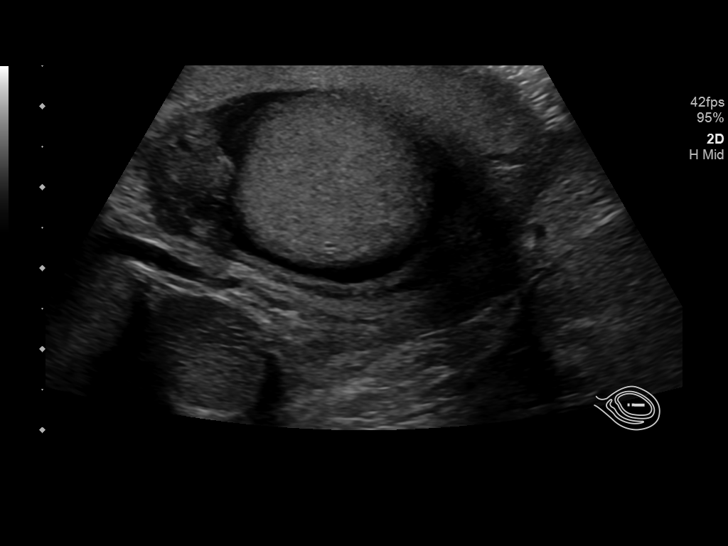
[im 44/88]
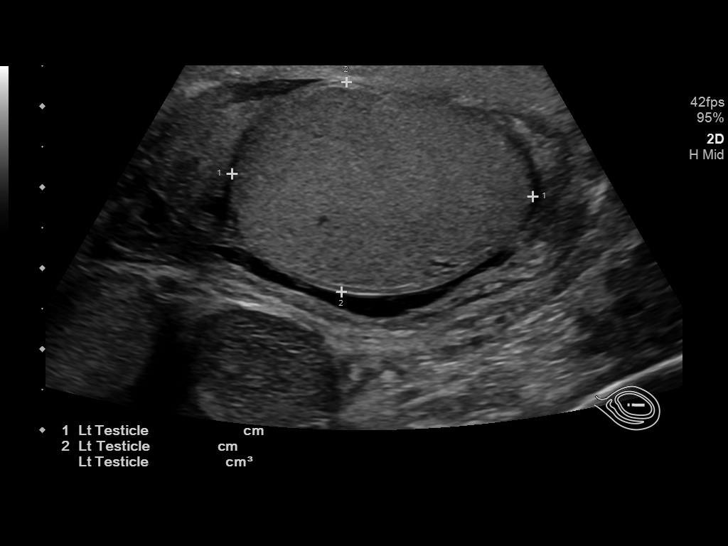
[im 51/88]
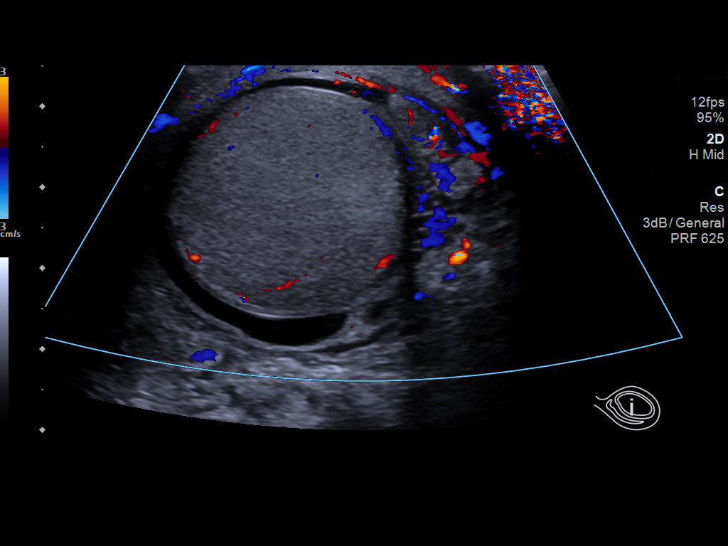
[im 59/88]
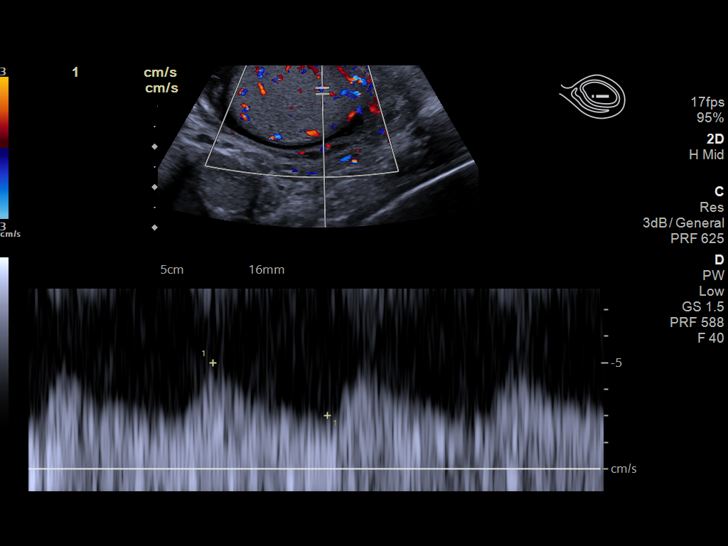
[im 66/88]
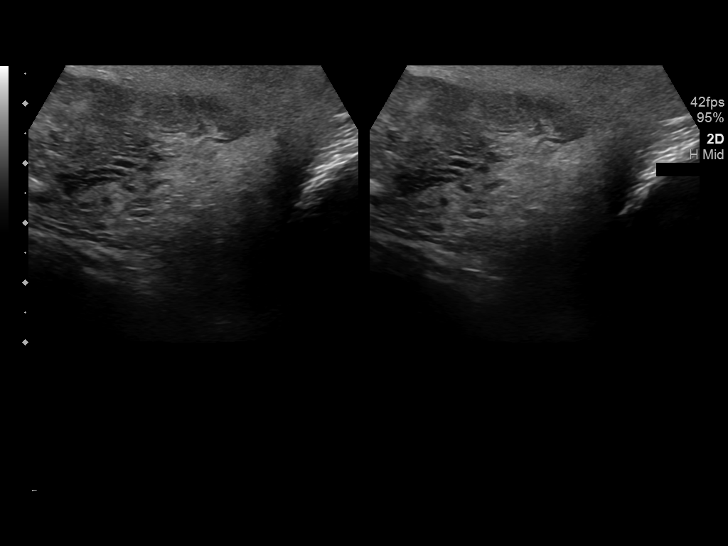
[im 73/88]
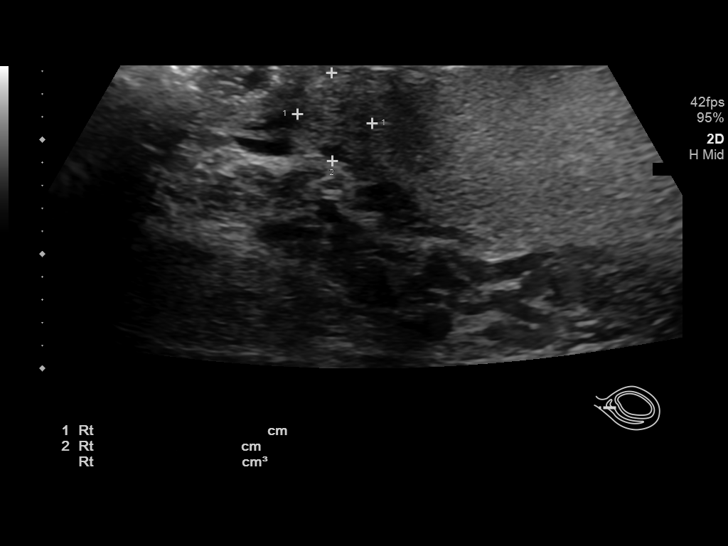
[im 80/88]
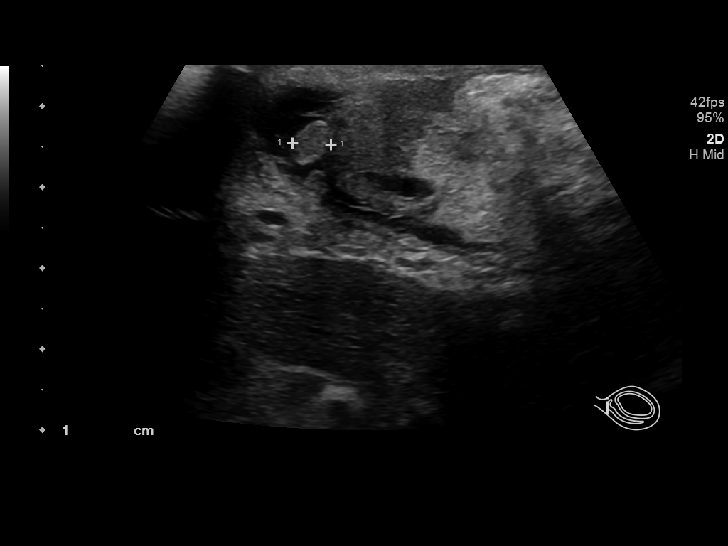
[im 88/88]
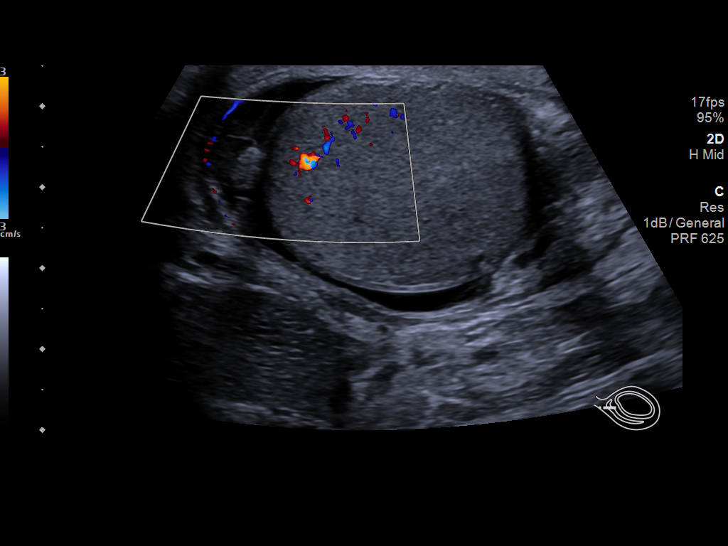

[13 of 25 positions shown; findings below may reference images not displayed]

FINDINGS: Right testicle

Measurements: 4.9 x 2.1 x 2.9 cm. Homogeneous echogenicity. Normal
blood flow. No mass or microlithiasis visualized.

Left testicle

Measurements: 3.7 x 2.6 x 3.0 cm. Heterogeneous parenchyma with some
increased echogenicity peripherally. Mild diffuse increase scrotal
vascularity, which is more prominent in the area of
hyperechogenicity. No microlithiasis visualized.

Right epididymis:  Normal in size and appearance.

Left epididymis: Normal in size and appearance. No definite
hyperemia. Small epididymal appendage.

Hydrocele:  Small on the left.

Varicocele:  None visualized.

Other: Left scrotal skin thickening compared to right with increased
vascularity.

Pulsed Doppler interrogation of both testes demonstrates normal low
resistance arterial and venous waveforms bilaterally.
IMPRESSION: 1. Left scrotal skin thickening with heterogeneous left testicular
parenchyma and increased vascularity, suggestive of orchitis. Areas
of increased echogenicity in the periphery of the left testicle with
more prominent vascularity favor more focal orchitis. Consider
follow-up ultrasound after resolution of symptoms to ensure
resolution and exclude the possibility of scrotal mass.
2. Small left hydrocele.
3. Normal sonographic appearance of the right testicle.

## 2020-09-25 MED ORDER — HYDROCODONE-ACETAMINOPHEN 5-325 MG PO TABS: 1.0000 | ORAL_TABLET | Freq: Four times a day (QID) | ORAL | 0 refills | Status: DC | PRN

## 2020-09-25 MED ORDER — LEVOFLOXACIN 500 MG PO TABS
500.0000 mg | ORAL_TABLET | Freq: Once | ORAL | Status: AC
  Administered 2020-09-25: 500 mg via ORAL
  Filled 2020-09-25: qty 1

## 2020-09-25 MED ORDER — SODIUM CHLORIDE 0.9 % IV BOLUS
1000.0000 mL | Freq: Once | INTRAVENOUS | Status: AC
Start: 2020-09-25 — End: 2020-09-25
  Administered 2020-09-25: 1000 mL via INTRAVENOUS

## 2020-09-25 MED ORDER — HYDROMORPHONE HCL 1 MG/ML IJ SOLN
0.5000 mg | Freq: Once | INTRAMUSCULAR | Status: AC
  Administered 2020-09-25: 0.5 mg via INTRAVENOUS
  Filled 2020-09-25: qty 1

## 2020-09-25 MED ORDER — LEVOFLOXACIN 500 MG PO TABS: 500.0000 mg | ORAL_TABLET | Freq: Every day | ORAL | 0 refills | Status: DC

## 2020-09-25 NOTE — Discharge Instructions (Signed)
As discussed, you have been diagnosed with orchiditis.  This is inflammation of the scrotum.  Typically this improves with antibiotics, rest.  If develop new, or concerning changes return here.  Otherwise follow-up with our urology colleagues.

## 2020-09-25 NOTE — ED Provider Notes (Signed)
[facility] DEPT Provider Note   CSN: [id number] Arrival date & time: 09/25/20  1348     History Chief Complaint  Patient presents with  . Testicle Pain    Caleb Cardenas is a 65 y.o. male.  HPI Patient presents with left testicle pain.  Onset was last night, and after not sleeping much he woke today with persistent pain in the left hemiscrotum.  No dysuria, polyuria, hematuria.  No history of urinary issues. With persistent severe sharp pain, worse with palpation.  Some lower abdominal pain, but no vomiting, no diarrhea.  No fever.  After going to his 45 office he was sent here for evaluation.    Past Medical History:  Diagnosis Date  . Blurred vision   . Dizziness   . Hypertension     There are no problems to display for this patient.   Past Surgical History:  Procedure Laterality Date  . KNEE SURGERY     x2  in high school       Family History  Problem Relation Age of Onset  . Heart attack Mother   . Alzheimer's disease Father   . Pancreatic cancer Brother     Social History   Tobacco Use  . Smoking status: Current Every Day Smoker    Packs/day: 0.50    Types: Cigarettes  . Smokeless tobacco: Never Used  Substance Use Topics  . Alcohol use: Yes    Comment: occ  . Drug use: No    Home Medications Prior to Admission medications   Medication Sig Start Date End Date Taking? Authorizing Provider  aspirin EC 81 MG tablet Take 81 mg by mouth daily. Swallow whole.    [provider]  clopidogrel (PLAVIX) 75 MG tablet Take 1 tablet (75 mg total) by mouth daily. 04/21/20   Penumalli, Earlean Polka, MD  Fructose-Dextrose-Phosphor Acd (NAUSEA CONTROL PO) Take 1 tablet by mouth daily as needed (nausea and vomiting). Patient not taking: Reported on 04/21/2020    [provider]  lisinopril-hydrochlorothiazide (PRINZIDE,ZESTORETIC) 10-12.5 MG tablet Take 1 tablet by mouth daily.    [provider]   Multiple Vitamin (MULTIVITAMIN ADULT) TABS Take 1 tablet by mouth 3 (three) times a week.    [provider]    Allergies    Patient has no known allergies.  Review of Systems   Review of Systems  Constitutional:       Per HPI, otherwise negative  HENT:       Per HPI, otherwise negative  Respiratory:       Per HPI, otherwise negative  Cardiovascular:       Per HPI, otherwise negative  Gastrointestinal: Negative for vomiting.  Endocrine:       Negative aside from HPI  Genitourinary:       Neg aside from HPI   Musculoskeletal:       Per HPI, otherwise negative  Skin: Negative.   Neurological: Negative for syncope.    Physical Exam Updated Vital Signs BP 135/73 (BP Location: Left Arm)   Pulse 75   Temp (!) 97.4 F (36.3 C) (Axillary)   Resp 20   SpO2 100%   Physical Exam Vitals and nursing note reviewed.  Constitutional:      General: He is not in acute distress.    Appearance: He is well-developed.  HENT:     Head: Normocephalic and atraumatic.  Eyes:     Conjunctiva/sclera: Conjunctivae normal.  Cardiovascular:     Rate  and Rhythm: Normal rate and regular rhythm.  Pulmonary:     Effort: Pulmonary effort is normal. No respiratory distress.     Breath sounds: No stridor.  Abdominal:     General: There is no distension.  Genitourinary:    Penis: Normal and circumcised.      Testes:        Left: Tenderness and swelling present.  Skin:    General: Skin is warm and dry.  Neurological:     Mental Status: He is alert and oriented to person, place, and time.     ED Results / Procedures / Treatments   Labs (all labs ordered are listed, but only abnormal results are displayed) Labs Reviewed  URINALYSIS, ROUTINE W REFLEX MICROSCOPIC - Abnormal; Notable for the following components:      Result Value   APPearance HAZY (*)    Hgb urine dipstick SMALL (*)    Leukocytes,Ua MODERATE (*)    Bacteria, UA RARE (*)    All other components within normal  limits    EKG None  Radiology US SCROTUM W/DOPPLER  Result Date: 09/25/2020 CLINICAL DATA:  65 year old with scrotal pain.  Concern for torsion. EXAM: SCROTAL ULTRASOUND DOPPLER ULTRASOUND OF THE TESTICLES TECHNIQUE: Complete ultrasound examination of the testicles, epididymis, and other scrotal structures was performed. Color and spectral Doppler ultrasound were also utilized to evaluate blood flow to the testicles. COMPARISON:  None. FINDINGS: Right testicle Measurements: 4.9 x 2.1 x 2.9 cm. Homogeneous echogenicity. Normal blood flow. No mass or microlithiasis visualized. Left testicle Measurements: 3.7 x 2.6 x 3.0 cm. Heterogeneous parenchyma with some increased echogenicity peripherally. Mild diffuse increase scrotal vascularity, which is more prominent in the area of hyperechogenicity. No microlithiasis visualized. Right epididymis:  Normal in size and appearance. Left epididymis: Normal in size and appearance. No definite hyperemia. Small epididymal appendage. Hydrocele:  Small on the left. Varicocele:  None visualized. Other: Left scrotal skin thickening compared to right with increased vascularity. Pulsed Doppler interrogation of both testes demonstrates normal low resistance arterial and venous waveforms bilaterally. IMPRESSION: 1. Left scrotal skin thickening with heterogeneous left testicular parenchyma and increased vascularity, suggestive of orchitis. Areas of increased echogenicity in the periphery of the left testicle with more prominent vascularity favor more focal orchitis. Consider follow-up ultrasound after resolution of symptoms to ensure resolution and exclude the possibility of scrotal mass. 2. Small left hydrocele. 3. Normal sonographic appearance of the right testicle. Electronically Signed   By: Keith Rake M.D.   On: 09/25/2020 15:41    Procedures Procedures   Medications Ordered in ED Medications  levofloxacin (LEVAQUIN) tablet 500 mg (has no administration in time  range)  sodium chloride 0.9 % bolus 1,000 mL (1,000 mLs Intravenous New Bag/Given (Non-Interop) 09/25/20 1430)  HYDROmorphone (DILAUDID) injection 0.5 mg (0.5 mg Intravenous Given 09/25/20 1430)    ED Course  I have reviewed the triage vital signs and the nursing notes.  Pertinent labs & imaging results that were available during my care of the patient were reviewed by me and considered in my medical decision making (see chart for details).   4:33 PM Patient in no distress, resting. I have reviewed his ultrasound, discussed it with him.  Ultrasound without evidence for torsion, but with findings concerning for orchitis. Urinalysis consistent with this as well.  Date:, Patient ambulatory, in no distress. Final Clinical Impression(s) / ED Diagnoses Final diagnoses:  Orchitis without abscess   MDM Number of Diagnoses or Management Options Orchitis without abscess: new,  needed workup   Amount and/or Complexity of Data Reviewed Clinical lab tests: ordered and reviewed Tests in the radiology section of CPT: ordered and reviewed Tests in the medicine section of CPT: ordered and reviewed Independent visualization of images, tracings, or specimens: yes  Risk of Complications, Morbidity, and/or Mortality Presenting problems: high Diagnostic procedures: high Management options: high  Critical Care Total time providing critical care: < 30 minutes  Patient Progress Patient progress: stable   Rx / DC Orders ED Discharge Orders         Ordered    HYDROcodone-acetaminophen (NORCO/VICODIN) 5-325 MG tablet  Every 6 hours PRN        09/25/20 1646    levofloxacin (LEVAQUIN) 500 MG tablet  Daily        09/25/20 1646           Carmin Muskrat, MD 09/25/20 2329

## 2020-09-25 NOTE — ED Triage Notes (Signed)
Patient reports left testicle pain since last night. Sent from [facility] for further evaluation.

## 2020-09-25 NOTE — ED Notes (Signed)
Patient provided with urinal for urine sample.

## 2020-10-19 ENCOUNTER — Telehealth: Payer: Medicare HMO | Admitting: Adult Health

## 2020-10-19 ENCOUNTER — Ambulatory Visit: Payer: Medicare HMO | Admitting: Adult Health

## 2020-10-27 ENCOUNTER — Ambulatory Visit: Payer: Medicare HMO | Admitting: Adult Health

## 2020-12-02 ENCOUNTER — Ambulatory Visit: Payer: Medicare HMO | Admitting: Adult Health

## 2021-02-16 NOTE — Progress Notes (Addendum)
[facility]  PATIENT: GERALDINE ALESSANDRINI DOB: 1956/04/17  REFERRING CLINICIAN: [facility] HISTORY FROM: patient  REASON FOR VISIT: new consult    HISTORICAL  CHIEF COMPLAINT:  Chief Complaint  Patient presents with   Follow-up    RM 3 alone Pt is well and stable      HISTORY OF PRESENT ILLNESS:   UPDATE 02/17/2021 JM: Returns for overdue 28-monthstroke follow-up.  Overall doing well.  Denies new stroke/TIA symptoms.  [facility] per pt (?PAD) (unable to view via epic).  Aspirin and Plavix discontinued.  Also recently started on statin 1 month ago but unsure of name. blood pressure today 146/71.  Continued tobacco use approx 1 PPD - trying to cut down.       HISTORY (copied from Dr. PGladstone Lighteroffice visit note 04/21/2020) 65year old male with cigarette smoking, hypertension, here for evaluation of stroke.  Patient has had intermittent episodes of dizziness since September 2021.  He went to urgent care several times for these symptoms.  Symptoms suddenly worsened on 03/31/2020 when he became very dizzy and had blurred vision.  Patient went to the hospital on 04/01/2020 for evaluation.  MRI of the brain showed acute stroke in the left cerebellum, slightly in the right cerebellum and midline cerebellum.  Significant intracranial atherosclerosis also noted the bilateral vertebral arteries.  Patient was discharged home with follow-up in outpatient neurology.  Since that time symptoms have improved.  He has continued on aspirin and Plavix.  He still smoking half pack cigarettes per day but try to cut down.  He drinks 3 Mountain Dew's per day.  He has not followed up with PCP yet.   REVIEW OF SYSTEMS: Full 14 system review of systems performed and negative with exception of: As per HPI.  ALLERGIES: No Known Allergies  HOME MEDICATIONS: Outpatient Medications Prior to  Visit  Medication Sig Dispense Refill   atorvastatin (LIPITOR) 20 MG tablet Take 20 mg by mouth at bedtime.     levofloxacin (LEVAQUIN) 500 MG tablet Take 1 tablet (500 mg total) by mouth daily. 7 tablet 0   lisinopril-hydrochlorothiazide (PRINZIDE,ZESTORETIC) 10-12.5 MG tablet Take 1 tablet by mouth daily.     Multiple Vitamin (MULTIVITAMIN ADULT) TABS Take 1 tablet by mouth 3 (three) times a week.     XARELTO 2.5 MG TABS tablet Take 2.5 mg by mouth 2 (two) times daily.     aspirin EC 81 MG tablet Take 81 mg by mouth daily. Swallow whole.     clopidogrel (PLAVIX) 75 MG tablet Take 1 tablet (75 mg total) by mouth daily. 90 tablet 4   HYDROcodone-acetaminophen (NORCO/VICODIN) 5-325 MG tablet Take 1 tablet by mouth every 6 (six) hours as needed for severe pain. 10 tablet 0   Fructose-Dextrose-Phosphor Acd (NAUSEA CONTROL PO) Take 1 tablet by mouth daily as needed (nausea and vomiting). (Patient not taking: Reported on 02/17/2021)     No facility-administered medications prior to visit.    PAST MEDICAL HISTORY: Past Medical History:  Diagnosis Date   Blurred vision    Dizziness    Hypertension     PAST SURGICAL HISTORY: Past Surgical History:  Procedure Laterality Date   KNEE SURGERY     x2  in high school    FAMILY HISTORY: Family History  Problem Relation Age of Onset   Heart attack Mother    Alzheimer's disease Father    Pancreatic cancer  Brother     SOCIAL HISTORY: Social History   Socioeconomic History   Marital status: Divorced    Spouse name: Not on file   Number of children: 1   Years of education: Not on file   Highest education level: Some college, no degree  Occupational History    Comment: house flipping, painting  Tobacco Use   Smoking status: Every Day    Packs/day: 0.50    Types: Cigarettes   Smokeless tobacco: Never  Substance and Sexual Activity   Alcohol use: Yes    Comment: occ   Drug use: No   Sexual activity: Not on file  Other Topics  Concern   Not on file  Social History Narrative   Lives alone   Caffeine- sodas 2-3 a day   Social Determinants of Health   Financial Resource Strain: Not on file  Food Insecurity: Not on file  Transportation Needs: Not on file  Physical Activity: Not on file  Stress: Not on file  Social Connections: Not on file  Intimate Partner Violence: Not on file     PHYSICAL EXAM  GENERAL EXAM/CONSTITUTIONAL: Vitals:  Vitals:   02/17/21 0736  BP: (!) 146/71  Pulse: 65  Weight: 159 lb (72.1 kg)  Height: '5\' 10"'$  (1.778 m)    Body mass index is 22.81 kg/m. Wt Readings from Last 3 Encounters:  02/17/21 159 lb (72.1 kg)  04/21/20 167 lb 3.2 oz (75.8 kg)  04/01/20 170 lb (77.1 kg)   Patient is in no distress; well developed, nourished and groomed; neck is supple  CARDIOVASCULAR: Examination of carotid arteries is normal; no carotid bruits Regular rate and rhythm, no murmurs Examination of peripheral vascular system by observation and palpation is normal  EYES: Ophthalmoscopic exam of optic discs and posterior segments is normal; no papilledema or hemorrhages  MUSCULOSKELETAL: Gait, strength, tone, movements noted in Neurologic exam below  NEUROLOGIC: MENTAL STATUS:  awake, alert, oriented to person, place and time recent and remote memory intact normal attention and concentration language fluent, comprehension intact, naming intact fund of knowledge appropriate  CRANIAL NERVE:  2nd - no papilledema on fundoscopic exam 2nd, 3rd, 4th, 6th - pupils equal and reactive to light, visual fields full to confrontation, extraocular muscles intact, no nystagmus 5th - facial sensation symmetric 7th - facial strength symmetric 8th - hearing intact 9th - palate elevates symmetrically, uvula midline 11th - shoulder shrug symmetric 12th - tongue protrusion midline  MOTOR:  normal bulk and tone, full strength in the BUE, BLE  SENSORY:  normal and symmetric to light touch,  temperature, vibration  COORDINATION:  finger-nose-finger, fine finger movements normal  REFLEXES:  deep tendon reflexes present and symmetric  GAIT/STATION:  narrow based gait     DIAGNOSTIC DATA (LABS, IMAGING, TESTING) - I reviewed patient records, labs, notes, testing and imaging myself where available.  Lab Results  Component Value Date   WBC 8.3 04/01/2020   HGB 16.1 04/01/2020   HCT 47.0 04/01/2020   MCV 91.1 04/01/2020   PLT 217 04/01/2020      Component Value Date/Time   NA 138 04/01/2020 1231   K 3.8 04/01/2020 1231   CL 104 04/01/2020 1231   CO2 23 04/01/2020 1231   GLUCOSE 106 (H) 04/01/2020 1231   BUN 25 (H) 04/01/2020 1231   CREATININE 1.59 (H) 04/01/2020 1231   CALCIUM 8.8 (L) 04/01/2020 1231   GFRNONAA 45 (L) 04/01/2020 1231   GFRAA 58 (L) 10/06/2015 1408   Lab Results  Component Value Date   CHOL 199 04/21/2020   HDL 25 (L) 04/21/2020   LDLCALC 76 04/21/2020   TRIG 621 (HH) 04/21/2020   CHOLHDL 8.0 (H) 04/21/2020   Lab Results  Component Value Date   HGBA1C 6.0 (H) 04/21/2020   No results found for: VITAMINB12 No results found for: TSH   04/01/20 MRI brain, MRA head / neck  1. Bilateral posterior circulation acute ischemia with a relatively large left PICA territory infarct. No hemorrhage or mass effect. 2. Suspect occlusion or stenosis of the distal left P3 segment.  3. 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 4. Short segment moderate stenosis of the proximal left external carotid artery.    ASSESSMENT AND PLAN  65 y.o. year old male here with:   Dx:  1. 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 Leta Baptist regarding need of TEE and possible loop as previously requested -Currently on Xarelto 2.5 mg twice  daily per cards (per pt, LE perfusion issues (?PAD) - unable to view via epic) -HLD: LDL goal<70. Recent lipid panel by cards at [facility] (unable to view via epic) - continue statin - daily physical activity / exercise (at least 15-30 minutes) - eat more plants / vegetables - increase social activities, brain stimulation, games, puzzles, hobbies, crafts, arts, music - aim for at least 7-8 hours sleep per night (or more) - avoid smoking and alcohol  ADDENDUM: per Dr. Gladstone Lighter recommendations, referral placed to cardiac EP to further discuss TEE and possible loop recorder insertion if negative in setting of embolic stroke of unknown etiology.  If atrial fibrillation found, Xarelto dosage would need to be increased   Return if symptoms worsen or fail to improve.     CC:  [facility] provider: Dr. Enid Baas, [facility]   I spent 32 minutes of face-to-face and non-face-to-face time with patient.  This included previsit chart review, lab review, study review, order entry, electronic health record documentation, patient education and discussion regarding prior stroke, secondary stroke prevention and agressive stroke risk factor management, possible further work up needed and answered all other questions to patients satisfaction   Frann Rider, [facility]  [facility] [address] [place], [place] [address]  Phone [phone] Fax [phone] Note: This document was prepared with digital dictation and possible smart phrase technology. Any transcriptional errors that result from this process are unintentional.

## 2021-02-17 ENCOUNTER — Encounter: Payer: Self-pay | Admitting: Adult Health

## 2021-02-17 ENCOUNTER — Ambulatory Visit: Payer: Medicare HMO | Admitting: Adult Health

## 2021-02-17 VITALS — BP 146/71 | HR 65 | Ht 70.0 in | Wt 159.0 lb

## 2021-02-17 DIAGNOSIS — I63433 Cerebral infarction due to embolism of bilateral posterior cerebral arteries: Secondary | ICD-10-CM | POA: Diagnosis not present

## 2021-02-17 NOTE — Patient Instructions (Addendum)
I will further discuss possibly pursuing transesophageal echo (looks at your heart in more detail to ensure there are not clots) and if negative, may need to consider further heart monitoring to rule out atrial fibrillation -if Dr. Leta Baptist feels this is still needed, we will let you know!  Continue to follow up with PCP/cardiology regarding cholesterol and blood pressure management  Maintain strict control of hypertension with blood pressure goal below 130/90 and cholesterol with LDL cholesterol (bad cholesterol) goal below 70 mg/dL.   Highly encourage complete tobacco cessation for further stroke prevention measures and overall health      Thank you for coming to see [place] at [facility]. I hope we have been able to provide you high quality care today.  You may receive a patient satisfaction survey over the next few weeks. We would appreciate your feedback and comments so that we may continue to improve ourselves and the health of our patients.     Transesophageal Echocardiogram Transesophageal echocardiogram (TEE) is a test that uses sound waves (echocardiogram) to produce very clear, detailed images of the heart and the arteries that lead to and from the heart. A TEE is done by passing a small ultrasound probe attached to a flexible tube down the mouth, through the throat, and into the esophagus. The heart is located in front of the esophagus. A TEE may be done: To check how well your heart valves are working. To check for any abnormal growth or tumor. To look for blood clots. To check for infection of the lining of the heart (endocarditis). To evaluate the dividing wall (septum) of the heart and check for a hole that did not close after birth (patent foramen ovale or atrial septal defect). To help diagnose a tear in the wall of the blood vessels (aortic dissection). To look at the heart shape, size, and function. Any changes can be associated with certain conditions,  including heart failure, aneurysm, and coronary heart disease (CHD). During cardiac valve surgery. This allows the surgeon to assess the valve repair before closing the chest. During a variety of other cardiac procedures to guide positioning of catheters. To monitor your heart's response to IV fluids or medicine. To evaluate your heart for a left atrial appendage occluder device. TEE is usually not a painful procedure. You will be given sedation for this procedure. You may feel the probe press against the back of the throat. The probe does not enter the trachea and does not affect your breathing. Tell a health care provider about: Any allergies you have. All medicines you are taking, including vitamins, herbs, eye drops, creams, and over-the-counter medicines. Any problems you or family members have had with anesthetic medicines. Any blood disorders you have. Any surgeries you have had. Any medical conditions you have. Any swallowing difficulties. Whether you have or have had a blockage of the esophagus (esophageal obstruction). Whether you are pregnant or may be pregnant. What are the risks? Generally, this is a safe procedure. However, problems may occur, including: Damage to nearby structures or organs. A tear of the esophagus. Irregular heartbeat. Hoarse voice or difficulty swallowing. Bleeding. What happens before the procedure? Medicines Ask your health care provider about: Changing or stopping your regular medicines. This is especially important if you are taking diabetes medicines or blood thinners. Taking medicines such as aspirin and ibuprofen. These medicines can thin your blood. Do not take these medicines unless your health care provider tells you to take them. Taking over-the-counter medicines, vitamins,  herbs, and supplements. General instructions Follow instructions from your health care provider about eating or drinking restrictions. You will need to remove any  dentures or dental retainers. Plan to have a responsible adult take you home from the hospital or clinic. Plan to have a responsible adult care for you for the time you are told after you leave the hospital or clinic. This is important. What happens during the procedure?  An IV will be inserted into one of your veins. You will be given one or more of the following: A medicine to help you relax (sedative). A medicine that is sprayed or gargled to numb the back of your throat (local anesthetic). Your blood pressure, heart rate, and breathing will be monitored during the procedure. You may be asked to lie on your left side. A bite block will be placed in your mouth to keep you from biting the flexible tube during the procedure. The probe will be placed into the back of your mouth. You will be asked to swallow. This helps to pass the tip of the probe into the esophagus. Once the tip of the probe is in the correct area, the probe sends sound waves to your heart and collects the echoes that bounce back. These echoes become pictures that show up on a video monitor. When the health care provider is finished taking pictures of your heart, the probe and bite block will be removed. The procedure may vary among health care providers and hospitals. What can I expect after the procedure? Your blood pressure, heart rate, breathing rate, and blood oxygen level will be monitored until you leave the hospital or clinic. When you first wake up, your throat may feel slightly sore and will probably still feel numb. This will improve slowly over time. You will not be allowed to eat or drink until the numbness has gone away as you could choke. It is common to have a sore throat for a day or two after the procedure. It is up to you to get the results of your procedure. Ask your health care provider, or the department that is doing the procedure, when your results will be ready. Follow these instructions at home: If you  were given a sedative during the procedure, it can affect you for several hours. Do not drive or operate machinery until your health care provider says that it is safe. Return to your normal activities as told by your health care provider. Ask your health care provider what activities are safe for you. Keep all follow-up visits. This is important. Summary Transesophageal echocardiogram (TEE) is a test that uses sound waves (echocardiogram) to produce very clear, detailed images of the heart. TEE is done by passing a small ultrasound probe attached to a flexible tube down the esophagus. Generally, this is a safe procedure. However, problems may occur, including damage to nearby structures or organs, bleeding, irregular heartbeat, and a hoarse voice or trouble swallowing. Tell your health care provider about any medicines you are taking and any medical conditions you have, including swallowing difficulties. This information is not intended to replace advice given to you by your health care provider. Make sure you discuss any questions you have with your health care provider. Document Revised: 02/11/2020 Document Reviewed: 01/28/2020 Elsevier Patient Education  2022 [employer].

## 2021-03-01 NOTE — Addendum Note (Signed)
Addended by: Frann Rider L on: 03/01/2021 05:57 PM   Modules accepted: Orders

## 2021-03-02 ENCOUNTER — Telehealth: Payer: Self-pay

## 2021-03-02 NOTE — Telephone Encounter (Signed)
I called pt and updated on message from NP.  Pt verbalized understanding and reports he is already established with Cardiologist. Pt is under the care of Dr. Mardene Speak at [facility]. I advised I would have our referral team send the recommendation over to him since he is already established with him.  Pt will call us back if he has not heard from his Cardiologist in a week about this recommendation.

## 2021-03-02 NOTE — Telephone Encounter (Signed)
-----   Message from Frann Rider, NP sent at 03/01/2021  5:57 PM EDT ----- Can you please notify patient of updated recommendation:   ADDENDUM: per Dr. Gladstone Lighter recommendations, referral placed to cardiac EP to further discuss TEE and possible loop recorder insertion if negative in setting of embolic stroke of unknown etiology.  If atrial fibrillation found, Xarelto dosage would need to be increased  Possibly of proceeding with this type of testing was discussed during prior visit

## 2021-03-03 NOTE — Telephone Encounter (Signed)
Faxed OV notes and recommendations to Dr. Raul Del office at [facility]. Fax: [phone].

## 2021-04-12 ENCOUNTER — Encounter: Payer: Self-pay | Admitting: Internal Medicine

## 2021-04-12 ENCOUNTER — Encounter: Payer: Self-pay | Admitting: *Deleted

## 2021-04-12 ENCOUNTER — Ambulatory Visit: Payer: Medicare HMO | Admitting: Internal Medicine

## 2021-04-12 ENCOUNTER — Other Ambulatory Visit: Payer: Self-pay

## 2021-04-12 VITALS — BP 140/82 | HR 78 | Ht 70.0 in | Wt 157.8 lb

## 2021-04-12 DIAGNOSIS — I639 Cerebral infarction, unspecified: Secondary | ICD-10-CM

## 2021-04-12 NOTE — Progress Notes (Signed)
Electrophysiology Office Note   Date:  04/12/2021   ID:  Caleb Cardenas, DOB [birth year], MRN [id number]  PCP:  [facility] [place]  Cardiologist:  Dr Dione Housekeeper [facility]) Primary Electrophysiologist: Thompson Grayer, MD    CC: stroke   History of Present Illness: Caleb Cardenas is a 65 y.o. male who presents today for electrophysiology evaluation.   He is referred by Dr Leta Baptist and Frann Rider NP for EP consultation regarding arrhythmogenic causes for stroke. He has struggle with dizziness and unsteadiness for over a year.  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  He has been evaluated by neurology and is referred for consideration of TEE and LINQ implant.  He continues to have dizziness.    Today, he denies symptoms of palpitations, chest pain, shortness of breath, orthopnea, PND, lower extremity edema,  presyncope, syncope, bleeding, or neurologic sequela. The patient is tolerating medications without difficulties and is otherwise without complaint today.    Past Medical History:  Diagnosis Date   Blurred vision    Dizziness    Hypertension    Stroke (cerebrum) ([place]) 03/2020   Past Surgical History:  Procedure Laterality Date   KNEE SURGERY     x2  in high school     Current Outpatient Medications  Medication Sig Dispense Refill   atorvastatin (LIPITOR) 20 MG tablet Take 20 mg by mouth at bedtime.     levofloxacin (LEVAQUIN) 500 MG tablet Take 1 tablet (500 mg total) by mouth daily. 7 tablet 0   lisinopril-hydrochlorothiazide (PRINZIDE,ZESTORETIC) 10-12.5 MG tablet Take 1 tablet by mouth daily.     Multiple Vitamin (MULTIVITAMIN ADULT) TABS Take 1 tablet by mouth 3 (three) times a week.     XARELTO 2.5 MG TABS tablet Take 2.5 mg by mouth 2 (two) times daily.     No current facility-administered medications for this visit.    Allergies:   Patient has no known allergies.   Social History:  The patient  reports that  he has been smoking cigarettes. He has been smoking an average of .5 packs per day. He has never used smokeless tobacco. He reports current alcohol use. He reports that he does not use drugs.   Family History:  The patient's family history includes Alzheimer's disease in his father; Heart attack in his mother; Pancreatic cancer in his brother.    ROS:  Please see the history of present illness.   All other systems are personally reviewed and negative.    PHYSICAL EXAM: VS:  BP 140/82   Pulse 78   Ht 5\' 10"  (1.778 m)   Wt 157 lb 12.8 oz (71.6 kg)   SpO2 97%   BMI 22.64 kg/m  , BMI Body mass index is 22.64 kg/m. GEN: Well nourished, well developed, in no acute distress HEENT: very poor dentition with visibly mobile teeth Neck: no JVD, carotid bruits, or masses Cardiac: RRR; no murmurs, rubs, or gallops,no edema  Respiratory:  clear to auscultation bilaterally, normal work of breathing GI: soft, nontender, nondistended, + BS MS: no deformity or atrophy Skin: warm and dry  Neuro:  Strength and sensation are intact Psych: euthymic mood, full affect  EKG:  EKG is ordered today. The ekg ordered today is personally reviewed and shows sinus rhythm with PVCs in trigeminy  Echo 06/26/20 also reviewed  Recent Labs: No results found for requested labs within last 8760 hours.  personally reviewed   Lipid Panel  Component Value Date/Time   CHOL 199 04/21/2020 1418   TRIG 621 (HH) 04/21/2020 1418   HDL 25 (L) 04/21/2020 1418   CHOLHDL 8.0 (H) 04/21/2020 1418   LDLCALC 76 04/21/2020 1418   personally reviewed   Wt Readings from Last 3 Encounters:  04/12/21 157 lb 12.8 oz (71.6 kg)  02/17/21 159 lb (72.1 kg)  04/21/20 167 lb 3.2 oz (75.8 kg)      Other studies personally reviewed: Additional studies/ records that were reviewed today include: neurology notes, prior echo, ecgs  Review of the above records today demonstrates: as above   Assessment and Plan:  1. Cryptogenic  stroke The patient presents for further evaluation of prior cryptogenic stroke.    I spoke at length with the patient about monitoring for afib with either a 30 day event monitor or an implantable loop recorder.   We also discussed TEE Risks, benefits, and alteratives to TEE and implantable loop recorder were discussed with the patient today.   At this time,given his very poor dentition, I worry about risks of tooth dislodgement/ aspiration with TEE.  I also worry about risks of infection with ILR implanted. I have therefore strongly advised that he have his teeth extracted/ repaired. Once this is achieved, we can reconsider TEE/ ILR. In the interim, we will place 30 day monitor to evaluate for afib as a possible cause of his stroke.  Please call with questions.      Follow-up:  return to see EP APP in 2 months     Signed, Thompson Grayer, MD  04/12/2021 9:45 AM     [facility] [address] [place] [address] [phone] (office) [phone] (fax)

## 2021-04-12 NOTE — Progress Notes (Signed)
Patient ID: Caleb Cardenas, male   DOB: September 14, 1955, 65 y.o.   MRN: [id number] Patient enrolled for [employer] to ship a 30 day cardiac event monitor to  his address on file.

## 2021-04-12 NOTE — Patient Instructions (Addendum)
Medication Instructions:  Your physician recommends that you continue on your current medications as directed. Please refer to the Current Medication list given to you today. *If you need a refill on your cardiac medications before your next appointment, please call your pharmacy*  Lab Work: None. If you have labs (blood work) drawn today and your tests are completely normal, you will receive your results only by: [place] (if you have MyChart) OR A paper copy in the mail If you have any lab test that is abnormal or we need to change your treatment, we will call you to review the results.  Testing/Procedures: Your physician has recommended that you wear an event monitor. Event monitors are medical devices that record the heart's electrical activity. Doctors most often [place] these monitors to diagnose arrhythmias. Arrhythmias are problems with the speed or rhythm of the heartbeat. The monitor is a small, portable device. You can wear one while you do your normal daily activities. This is usually used to diagnose what is causing palpitations/syncope (passing out).   Follow-Up: At [facility], you and your health needs are our priority.  As part of our continuing mission to provide you with exceptional heart care, we have created designated Provider Care Teams.  These Care Teams include your primary Cardiologist (physician) and Advanced Practice Providers (APPs -  Physician Assistants and Nurse Practitioners) who all work together to provide you with the care you need, when you need it.  Your physician wants you to follow-up in: 06/22/21 at 8 am one of the following Advanced Practice Providers on your designated Care Team:     Legrand Como "Jonni Sanger" [place], [place]   We recommend signing up for the patient portal called "MyChart".  Sign up information is provided on this After Visit Summary.  MyChart is used to connect with patients for Virtual Visits (Telemedicine).  Patients are able to view  lab/test results, encounter notes, upcoming appointments, etc.  Non-urgent messages can be sent to your provider as well.   To learn more about what you can do with MyChart, go to [email or web address].    Any Other Special Instructions Will Be Listed Below (If Applicable).  [employer] Cardiac Event Monitor Instructions Your physician has requested you wear your cardiac event monitor for _____ days, (1-30). [employer] may call or text to confirm a shipping address. The monitor will be sent to a land address via UPS. [employer] will not ship a monitor to a PO BOX. It typically takes 3-5 days to receive your monitor after it has been enrolled. [employer] will assist with USPS tracking if your package is delayed. The telephone number for [employer] is [phone]. Once you have received your monitor, please review the enclosed instructions. Instruction tutorials can also be viewed under help and settings on the enclosed cell phone. Your monitor has already been registered assigning a specific monitor serial # to you.  Applying the monitor Remove cell phone from case and turn it on. The cell phone works as Dealer and needs to be within [employer] of you at all times. The cell phone will need to be charged on a daily basis. We recommend you plug the cell phone into the enclosed charger at your bedside table every night.  Monitor batteries: You will receive two monitor batteries labelled #1 and #2. These are your recorders. Plug battery #2 onto the second connection on the enclosed charger. Keep one battery on the charger at all times. This will keep the monitor battery deactivated. It will  also keep it fully charged for when you need to switch your monitor batteries. A small light will be blinking on the battery emblem when it is charging. The light on the battery emblem will remain on when the battery is fully charged.  Open package of a Monitor strip. Insert battery #1 into black  hood on strip and gently squeeze monitor battery onto connection as indicated in instruction booklet. Set aside while preparing skin.  Choose location for your strip, vertical or horizontal, as indicated in the instruction booklet. Shave to remove all hair from location. There cannot be any lotions, oils, powders, or colognes on skin where monitor is to be applied. Wipe skin clean with enclosed Saline wipe. Dry skin completely.  Peel paper labeled #1 off the back of the Monitor strip exposing the adhesive. Place the monitor on the chest in the vertical or horizontal position shown in the instruction booklet. One arrow on the monitor strip must be pointing upward. Carefully remove paper labeled #2, attaching remainder of strip to your skin. Try not to create any folds or wrinkles in the strip as you apply it.  Firmly press and release the circle in the center of the monitor battery. You will hear a small beep. This is turning the monitor battery on. The heart emblem on the monitor battery will light up every 5 seconds if the monitor battery in turned on and connected to the patient securely. Do not push and hold the circle down as this turns the monitor battery off. The cell phone will locate the monitor battery. A screen will appear on the cell phone checking the connection of your monitor strip. This may read poor connection initially but change to good connection within the next minute. Once your monitor accepts the connection you will hear a series of 3 beeps followed by a climbing crescendo of beeps. A screen will appear on the cell phone showing the two monitor strip placement options. Touch the picture that demonstrates where you applied the monitor strip.  Your monitor strip and battery are waterproof. You are able to shower, bathe, or swim with the monitor on. They just ask you do not submerge deeper than 3 feet underwater. We recommend removing the monitor if you are swimming in a  lake, river, or ocean.  Your monitor battery will need to be switched to a fully charged monitor battery approximately once a week. The cell phone will alert you of an action which needs to be made.  On the cell phone, tap for details to reveal connection status, monitor battery status, and cell phone battery status. The green dots indicates your monitor is in good status. A red dot indicates there is something that needs your attention.  To record a symptom, click the circle on the monitor battery. In 30-60 seconds a list of symptoms will appear on the cell phone. Select your symptom and tap save. Your monitor will record a sustained or significant arrhythmia regardless of you clicking the button. Some patients do not feel the heart rhythm irregularities. [employer] will notify us of any serious or critical events.  Refer to instruction booklet for instructions on switching batteries, changing strips, the Do not disturb or Pause features, or any additional questions.  Call [employer] at [phone], to confirm your monitor is transmitting and record your baseline. They will answer any questions you may have regarding the monitor instructions at that time.  Returning the monitor to [place] all equipment back into blue box.  Peel off strip of paper to expose adhesive and close box securely. There is a prepaid UPS shipping label on this box. Drop in a UPS drop box, or at a UPS facility like [employer]. You may also contact [employer] to arrange UPS to pick up monitor package at your home.

## 2021-04-13 ENCOUNTER — Encounter (HOSPITAL_BASED_OUTPATIENT_CLINIC_OR_DEPARTMENT_OTHER): Payer: Self-pay

## 2021-04-13 ENCOUNTER — Emergency Department (HOSPITAL_BASED_OUTPATIENT_CLINIC_OR_DEPARTMENT_OTHER): Payer: Medicare HMO

## 2021-04-13 ENCOUNTER — Other Ambulatory Visit: Payer: Self-pay

## 2021-04-13 ENCOUNTER — Emergency Department (HOSPITAL_COMMUNITY): Payer: Medicare HMO

## 2021-04-13 ENCOUNTER — Emergency Department (HOSPITAL_BASED_OUTPATIENT_CLINIC_OR_DEPARTMENT_OTHER)
Admission: EM | Admit: 2021-04-13 | Discharge: 2021-04-14 | Disposition: A | Discharge status: Home / Self Care | Payer: Medicare HMO | Attending: Emergency Medicine | Admitting: Emergency Medicine

## 2021-04-13 DIAGNOSIS — Y900 Blood alcohol level of less than 20 mg/100 ml: Secondary | ICD-10-CM | POA: Diagnosis not present

## 2021-04-13 DIAGNOSIS — Z20822 Contact with and (suspected) exposure to covid-19: Secondary | ICD-10-CM | POA: Diagnosis not present

## 2021-04-13 DIAGNOSIS — Z79899 Other long term (current) drug therapy: Secondary | ICD-10-CM | POA: Insufficient documentation

## 2021-04-13 DIAGNOSIS — R531 Weakness: Secondary | ICD-10-CM | POA: Insufficient documentation

## 2021-04-13 DIAGNOSIS — R42 Dizziness and giddiness: Secondary | ICD-10-CM | POA: Insufficient documentation

## 2021-04-13 DIAGNOSIS — I10 Essential (primary) hypertension: Secondary | ICD-10-CM | POA: Diagnosis not present

## 2021-04-13 DIAGNOSIS — F17210 Nicotine dependence, cigarettes, uncomplicated: Secondary | ICD-10-CM | POA: Diagnosis not present

## 2021-04-13 LAB — DIFFERENTIAL
Abs Immature Granulocytes: 0.05 10*3/uL (ref 0.00–0.07)
Basophils Absolute: 0.1 10*3/uL (ref 0.0–0.1)
Basophils Relative: 1 %
Eosinophils Absolute: 0.1 10*3/uL (ref 0.0–0.5)
Eosinophils Relative: 1 %
Immature Granulocytes: 0 %
Lymphocytes Relative: 34 %
Lymphs Abs: 4 10*3/uL (ref 0.7–4.0)
Monocytes Absolute: 0.9 10*3/uL (ref 0.1–1.0)
Monocytes Relative: 7 %
Neutro Abs: 6.5 10*3/uL (ref 1.7–7.7)
Neutrophils Relative %: 57 %

## 2021-04-13 LAB — COMPREHENSIVE METABOLIC PANEL
ALT: 18 U/L (ref 0–44)
AST: 19 U/L (ref 15–41)
Albumin: 4.3 g/dL (ref 3.5–5.0)
Alkaline Phosphatase: 88 U/L (ref 38–126)
Anion gap: 12 (ref 5–15)
BUN: 25 mg/dL — ABNORMAL HIGH (ref 8–23)
CO2: 20 mmol/L — ABNORMAL LOW (ref 22–32)
Calcium: 9.2 mg/dL (ref 8.9–10.3)
Chloride: 102 mmol/L (ref 98–111)
Creatinine, Ser: 1.39 mg/dL — ABNORMAL HIGH (ref 0.61–1.24)
GFR, Estimated: 56 mL/min — ABNORMAL LOW (ref >60)
Glucose, Bld: 95 mg/dL (ref 70–99)
Potassium: 3.4 mmol/L — ABNORMAL LOW (ref 3.5–5.1)
Sodium: 134 mmol/L — ABNORMAL LOW (ref 135–145)
Total Bilirubin: 0.4 mg/dL (ref 0.3–1.2)
Total Protein: 7.4 g/dL (ref 6.5–8.1)

## 2021-04-13 LAB — URINALYSIS, ROUTINE W REFLEX MICROSCOPIC
Bilirubin Urine: NEGATIVE
Glucose, UA: NEGATIVE mg/dL
Hgb urine dipstick: NEGATIVE
Ketones, ur: NEGATIVE mg/dL
Leukocytes,Ua: NEGATIVE
Nitrite: NEGATIVE
Protein, ur: NEGATIVE mg/dL
Specific Gravity, Urine: <1.005 — ABNORMAL LOW (ref 1.005–1.030)
pH: 7 (ref 5.0–8.0)

## 2021-04-13 LAB — CBC
HCT: 43 % (ref 39.0–52.0)
Hemoglobin: 14.7 g/dL (ref 13.0–17.0)
MCH: 29.6 pg (ref 26.0–34.0)
MCHC: 34.2 g/dL (ref 30.0–36.0)
MCV: 86.5 fL (ref 80.0–100.0)
Platelets: 246 10*3/uL (ref 150–400)
RBC: 4.97 MIL/uL (ref 4.22–5.81)
RDW: 13.8 % (ref 11.5–15.5)
WBC: 11.6 10*3/uL — ABNORMAL HIGH (ref 4.0–10.5)
nRBC: 0 % (ref 0.0–0.2)

## 2021-04-13 LAB — RAPID URINE DRUG SCREEN, HOSP PERFORMED
Amphetamines: NOT DETECTED
Barbiturates: NOT DETECTED
Benzodiazepines: NOT DETECTED
Cocaine: NOT DETECTED
Opiates: NOT DETECTED
Tetrahydrocannabinol: NOT DETECTED

## 2021-04-13 LAB — RESP PANEL BY RT-PCR (FLU A&B, COVID) ARPGX2
Influenza A by PCR: NEGATIVE
Influenza B by PCR: NEGATIVE
SARS Coronavirus 2 by RT PCR: NEGATIVE

## 2021-04-13 LAB — CBG MONITORING, ED: Glucose-Capillary: 109 mg/dL — ABNORMAL HIGH (ref 70–99)

## 2021-04-13 LAB — ETHANOL: Alcohol, Ethyl (B): <10 mg/dL (ref <10)

## 2021-04-13 IMAGING — MR MR HEAD W/O CM
12 of 13 series · 44 of 48 positions shown · non-contrast
Comparison: CT from earlier the same day.

CLINICAL DATA: Initial evaluation for neuro deficit, stroke
suspected.

EXAM:
MRI HEAD WITHOUT CONTRAST
TECHNIQUE: Multiplanar, multiecho pulse sequences of the brain and surrounding
structures were obtained without intravenous contrast.

[Series 5: DWI · axial · 3.0mm · 0.92mm/px · z∈[-119,+39]mm · 8 of 108 slices shown (1 of 4)]
[im 1/108]
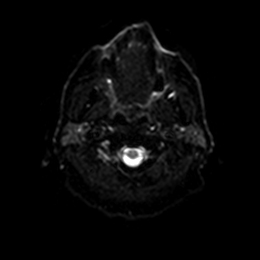
[im 16/108]
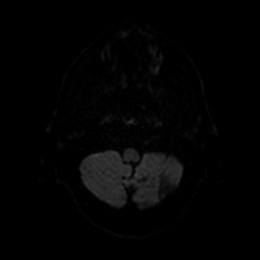
[im 31/108]
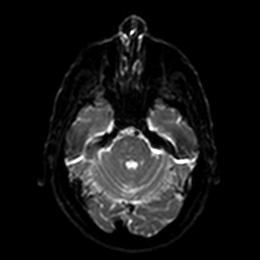
[im 46/108]
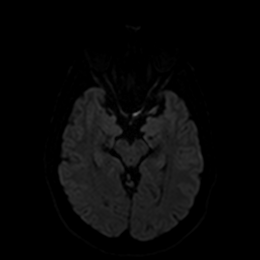
[im 62/108]
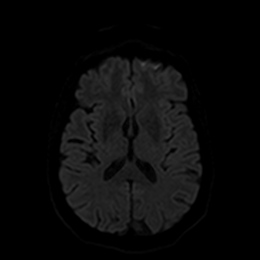
[im 77/108]
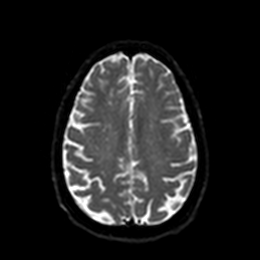
[im 92/108]
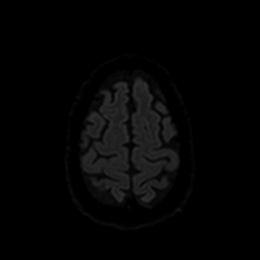
[im 108/108]
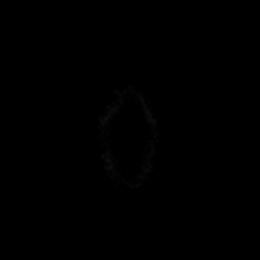

[Series 6: DWI · axial · 3.0mm · 0.92mm/px · z∈[-119,+39]mm · 4 of 51 slices shown (2 of 4)]
[im 1/51]
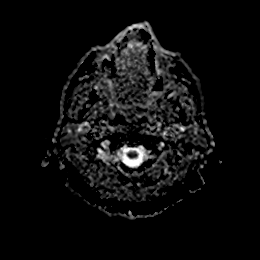
[im 17/51]
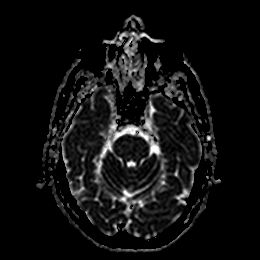
[im 34/51]
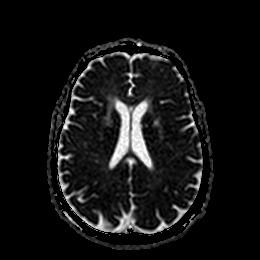
[im 51/51]
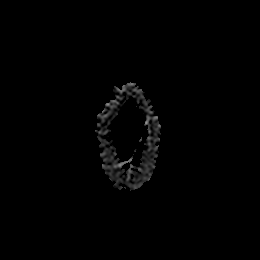

[Series 7: DWI · coronal · 4.0mm · 0.88mm/px · 5 of 76 slices shown (3 of 4)]
[im 1/76]
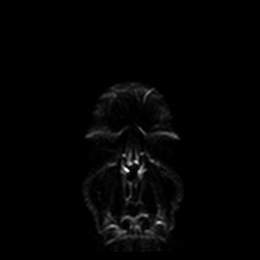
[im 19/76]
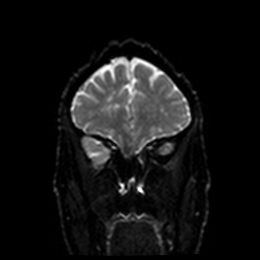
[im 38/76]
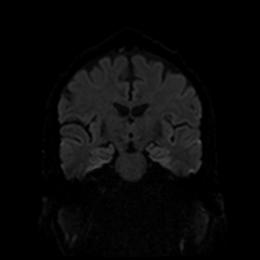
[im 57/76]
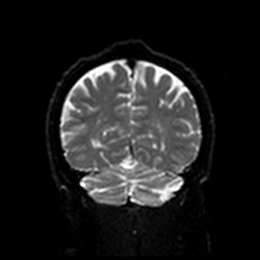
[im 76/76]
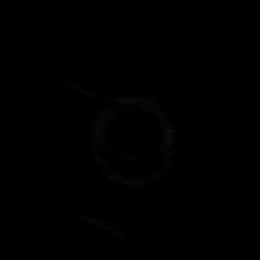

[Series 8: DWI · coronal · 4.0mm · 0.88mm/px · 3 of 38 slices shown (4 of 4)]
[im 1/38]
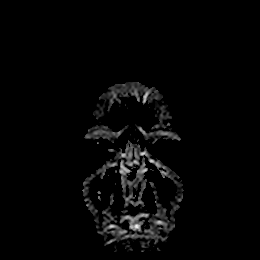
[im 19/38]
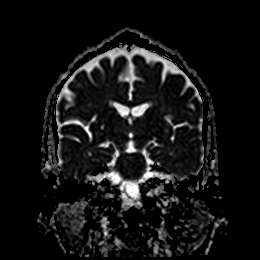
[im 38/38]
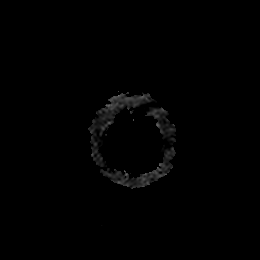

[Series 9: T1 · sagittal · 5.0mm · 0.75mm/px · 2 of 25 slices shown]
[im 1/25]
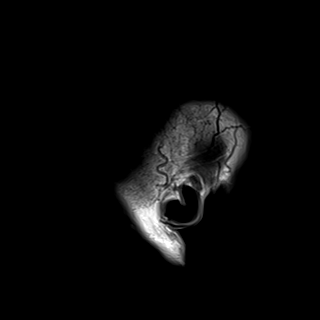
[im 25/25]
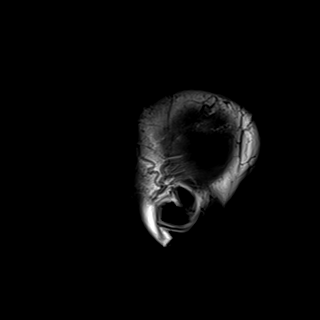

[Series 10: T2 · axial · 5.0mm · 0.75mm/px · z∈[-118,+37]mm · 2 of 27 slices shown (1 of 2)]
[im 1/27]
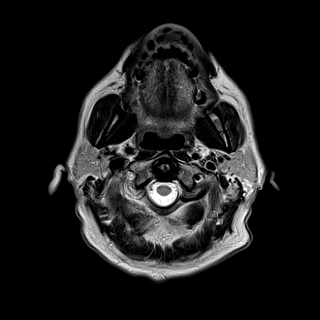
[im 27/27]
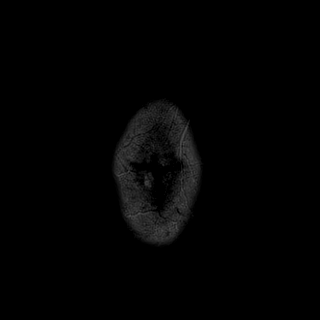

[Series 11: FLAIR · axial · 5.0mm · 0.45mm/px · z∈[-119,+37]mm · 2 of 27 slices shown]
[im 1/27]
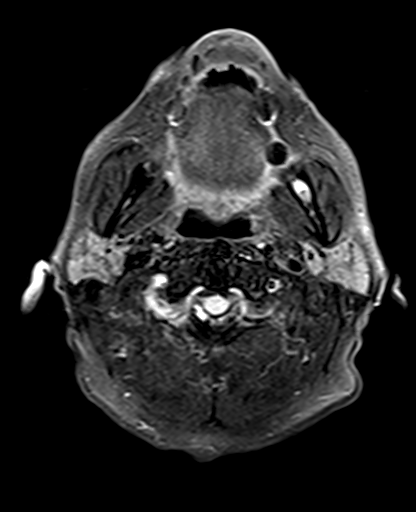
[im 27/27]
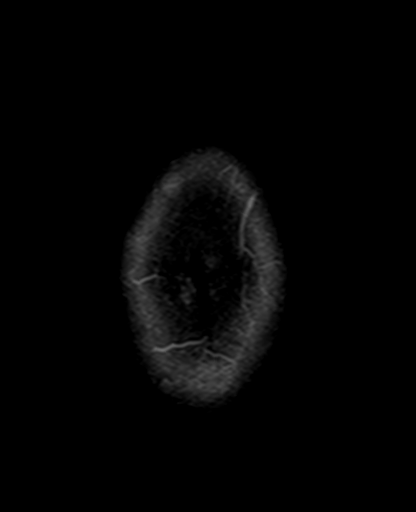

[Series 12: mag_images · axial · 3.0mm · 0.94mm/px · z∈[-123,+54]mm · 4 of 60 slices shown]
[im 1/60]
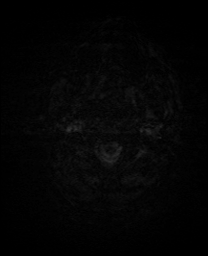
[im 20/60]
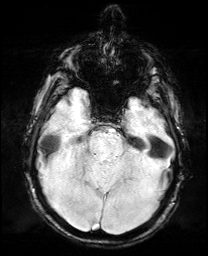
[im 40/60]
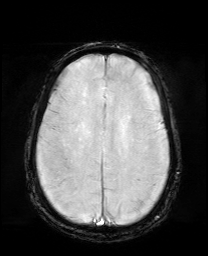
[im 60/60]
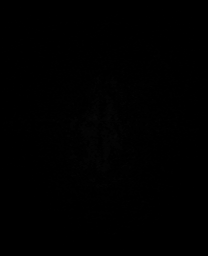

[Series 13: pha_images · axial · 3.0mm · 0.94mm/px · z∈[-120,+48]mm · 4 of 56 slices shown]
[im 1/56]
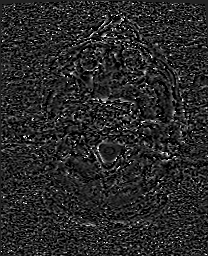
[im 19/56]
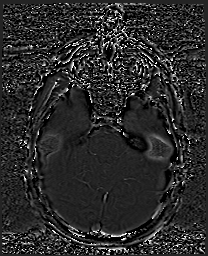
[im 37/56]
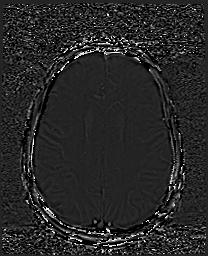
[im 56/56]
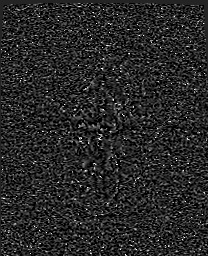

[Series 14: swi_images · axial · 3.0mm · 0.94mm/px · z∈[-123,+54]mm · 4 of 60 slices shown]
[im 1/60]
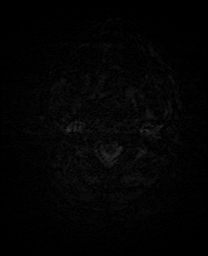
[im 20/60]
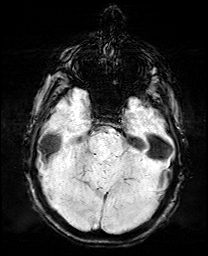
[im 40/60]
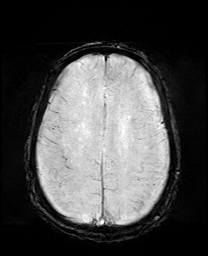
[im 60/60]
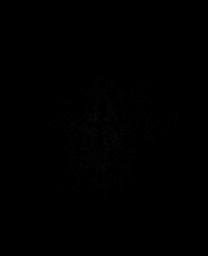

[Series 15: mip_images(sw) · axial · 24.0mm · 0.94mm/px · z∈[-112,+43]mm · 4 of 53 slices shown]
[im 1/53]
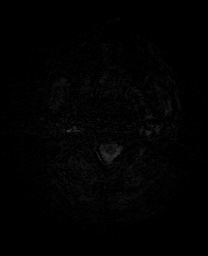
[im 18/53]
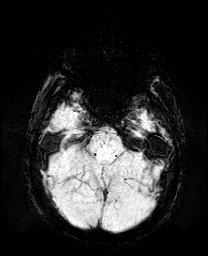
[im 35/53]
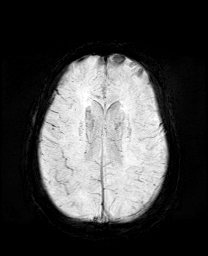
[im 53/53]
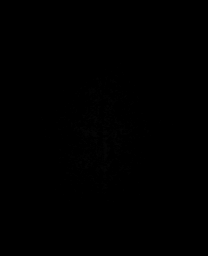

[Series 17: T2 · coronal · 5.0mm · 0.34mm/px · 2 of 31 slices shown (2 of 2)]
[im 1/31]
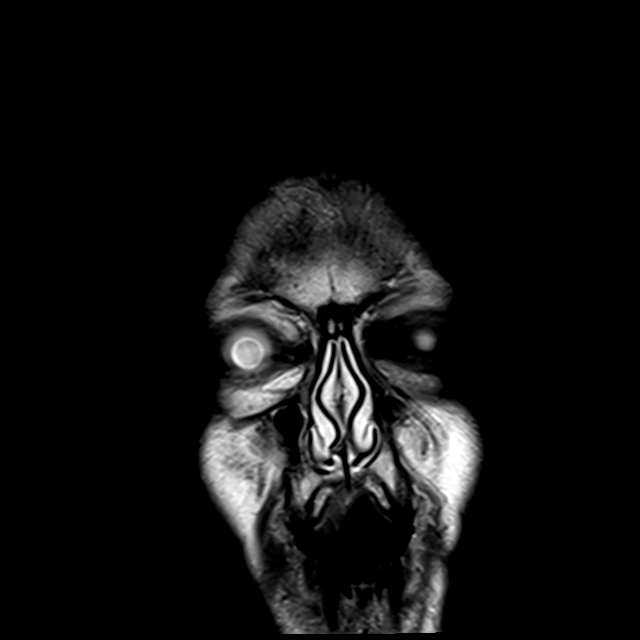
[im 31/31]
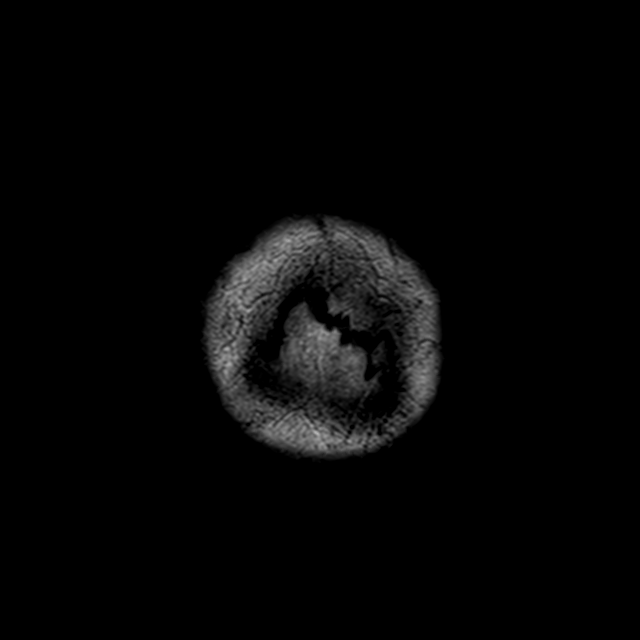

[44 of 48 positions shown; findings below may reference images not displayed]

FINDINGS: Brain: Cerebral volume within normal limits for age. Scattered
patchy T2/FLAIR hyperintensity involving the periventricular and
deep white matter both cerebral hemispheres as well as the pons,
most consistent with chronic small vessel ischemic disease, moderate
in nature. Few scattered associated remote lacunar infarcts present
about the bilateral basal ganglia. Remote left PICA territory
infarct involving the inferior left cerebellum noted. Associated
mild chronic hemosiderin staining. Few additional tiny right
cerebellar infarcts noted as well.

No abnormal foci of restricted diffusion to suggest acute or
subacute ischemia. Gray-white matter differentiation otherwise
maintained. No other areas of remote cortical infarction. No
evidence for acute intracranial hemorrhage.

No mass lesion, midline shift or mass effect. No hydrocephalus or
extra-axial fluid collection. Pituitary gland suprasellar region
within normal limits. Midline structures intact.

Vascular: Major intracranial vascular flow voids are maintained.

Skull and upper cervical spine: Craniocervical junction within
normal limits. Bone marrow signal intensity normal. No scalp soft
tissue abnormality.

Sinuses/Orbits: Globes and orbital soft tissues within normal
limits. Moderate mucosal thickening noted within the ethmoidal air
cells. Paranasal sinuses are otherwise largely clear. Trace left
mastoid effusion noted, of doubtful significance. Inner ear
structures grossly normal.

Other: None.
IMPRESSION: 1. No acute intracranial abnormality.
2. Remote left PICA territory infarct, with a few additional tiny
right cerebellar infarcts.
3. Underlying moderate chronic microvascular ischemic disease.

## 2021-04-13 IMAGING — CT CT HEAD W/O CM
4 series · 17 of 47 positions shown, 19 images · non-contrast
Comparison: [DATE]

CLINICAL DATA: Neuro deficit, acute, stroke suspected.  Dizziness

EXAM:
CT HEAD WITHOUT CONTRAST
TECHNIQUE: Contiguous axial images were obtained from the base of the skull
through the vertex without intravenous contrast.

[Series 2: head wo · axial · 0.43mm/px · z∈[-204,-84]mm · 7 of 32 slices shown, 9 images]
[im 4/32  brain]
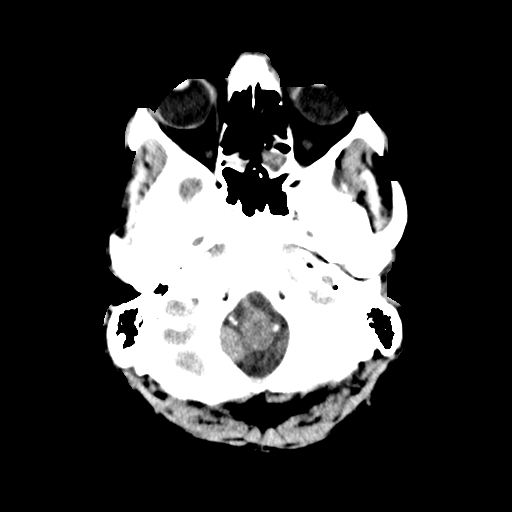
[im 4/32  bone]
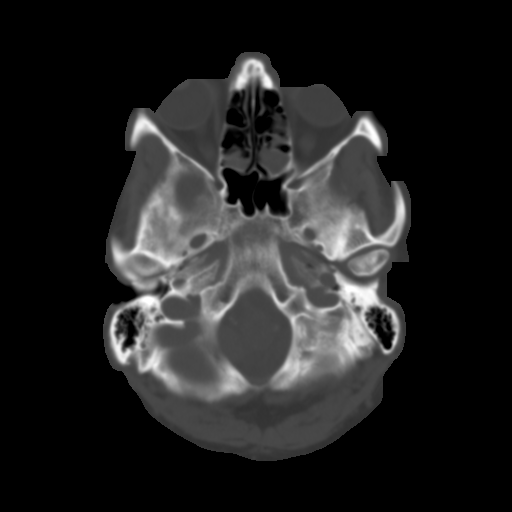
[im 8/32  brain]
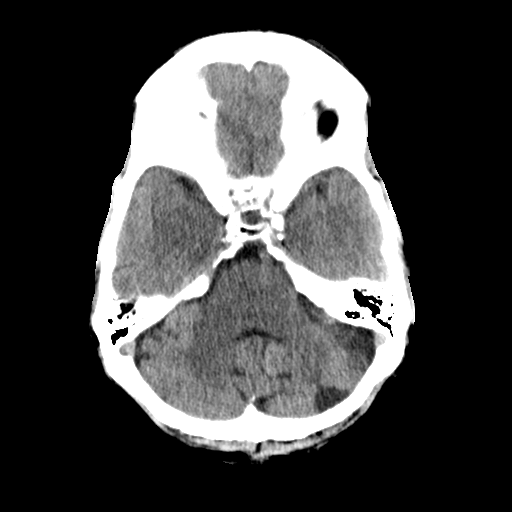
[im 12/32  brain]
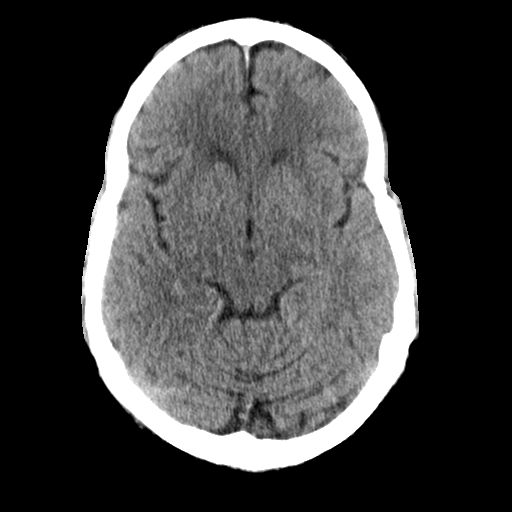
[im 16/32  brain]
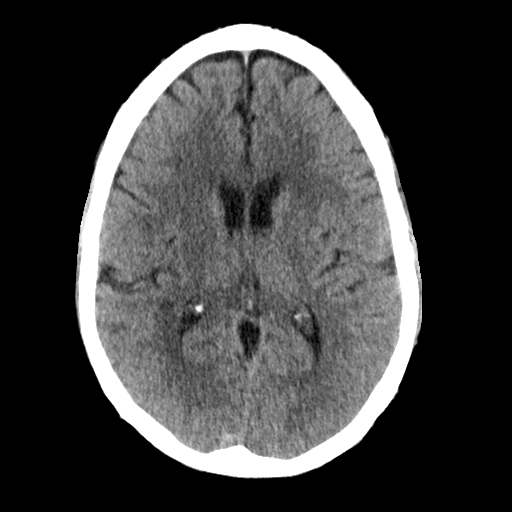
[im 20/32  brain]
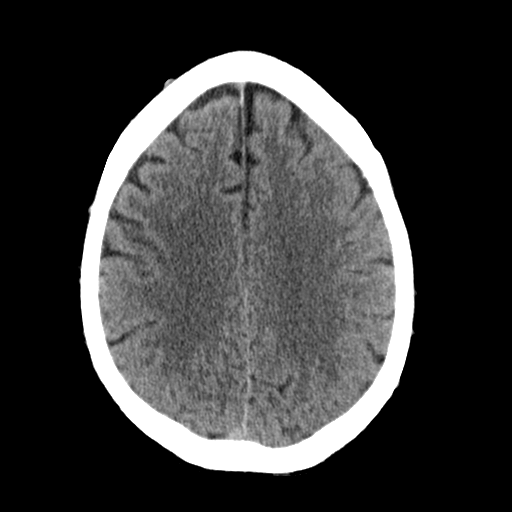
[im 20/32  bone]
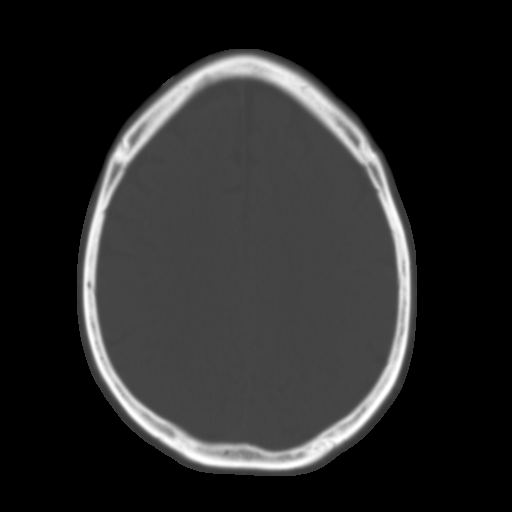
[im 24/32  brain]
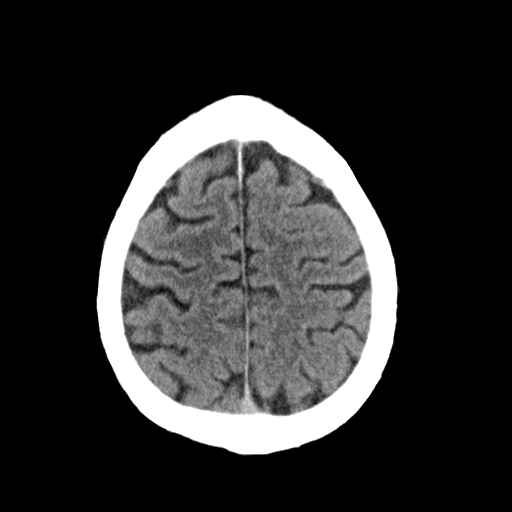
[im 28/32  brain]
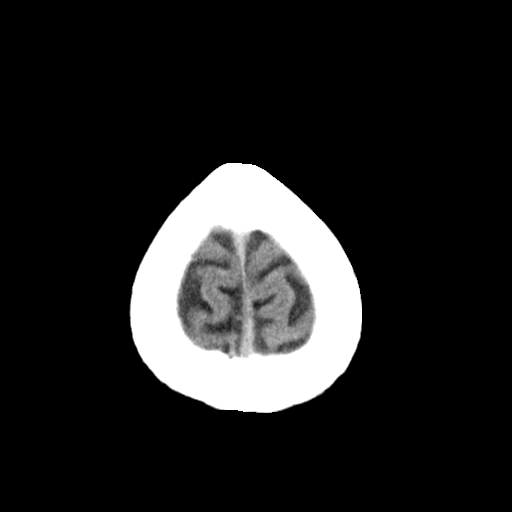

[Series 3: head bone · axial · 0.43mm/px · z∈[-205,-149]mm · 4 of 80 slices shown]
[im 8/80  bone]
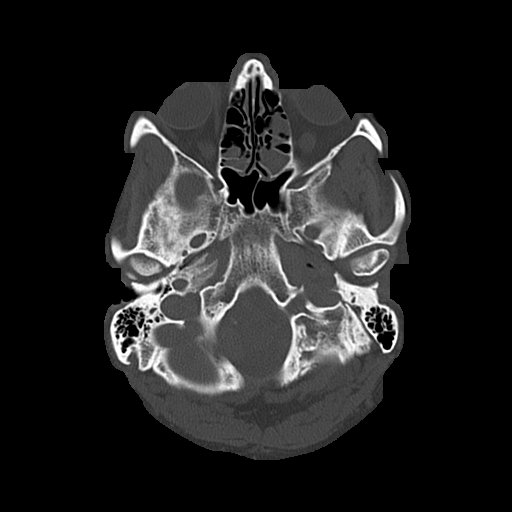
[im 16/80  bone]
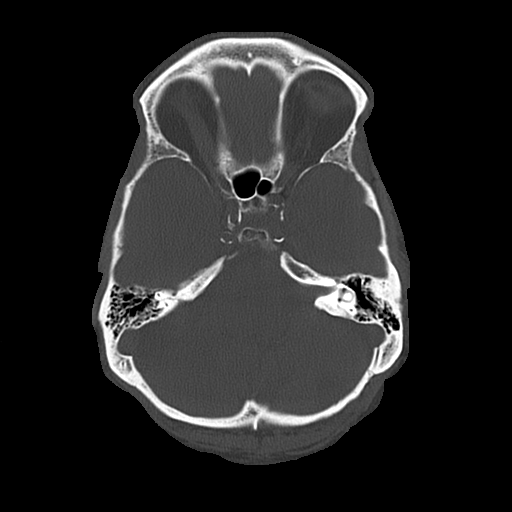
[im 24/80  bone]
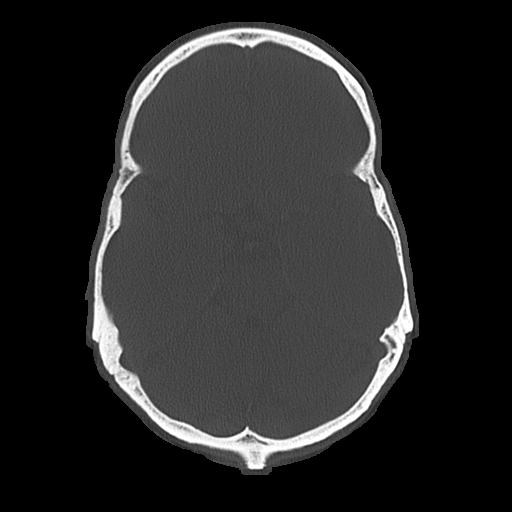
[im 36/80  bone]
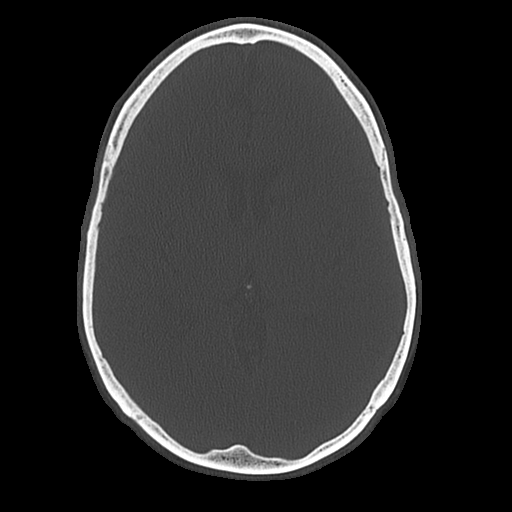

[Series 4: coronal soft · coronal · 0.33mm/px · 3 of 69 slices shown]
[im 23/69  brain]
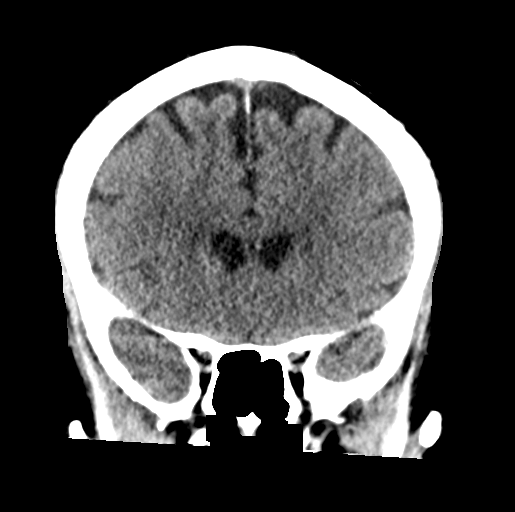
[im 31/69  brain]
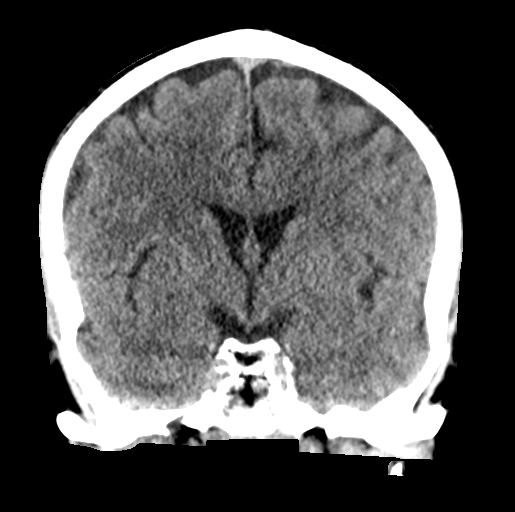
[im 38/69  brain]
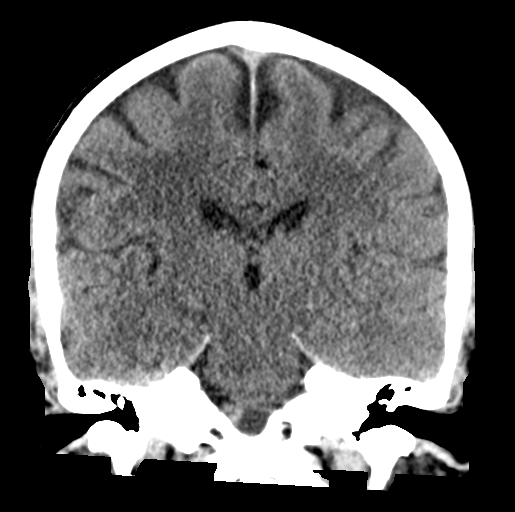

[Series 5: sagittal soft · sagittal · 0.33mm/px · 3 of 57 slices shown]
[im 19/57  brain]
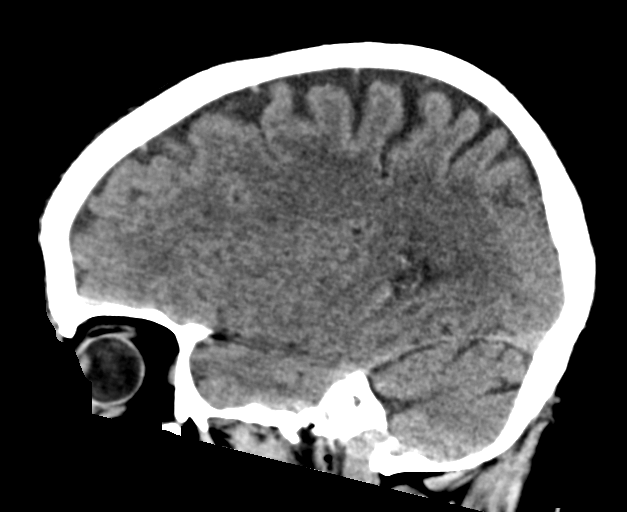
[im 29/57  brain]
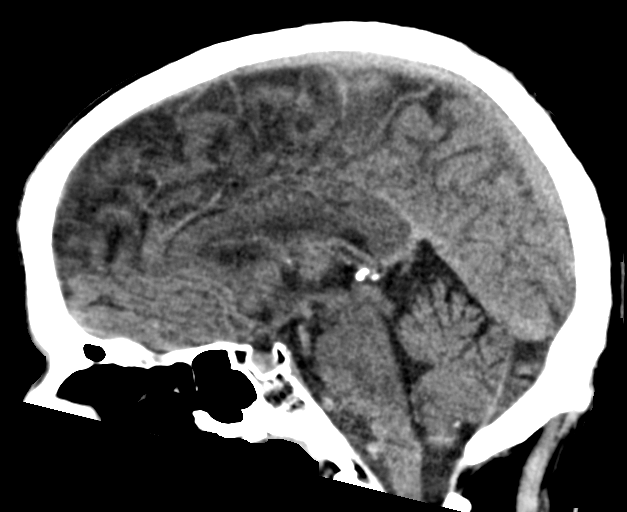
[im 38/57  brain]
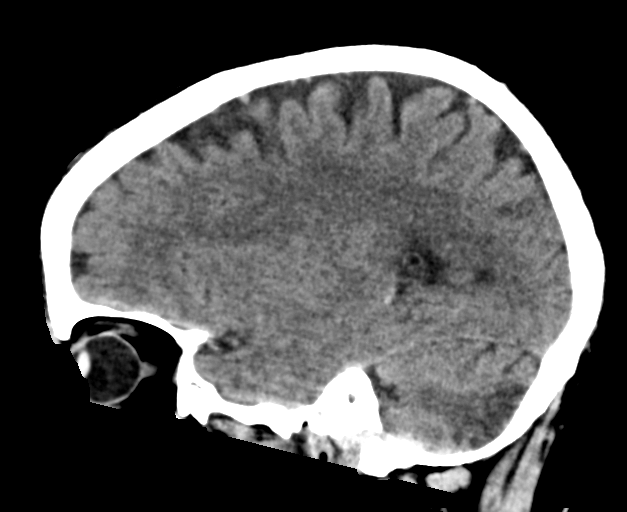

[17 of 47 positions shown; findings below may reference images not displayed]

FINDINGS: Brain: Old left cerebellar infarct. There is atrophy and chronic
small vessel disease changes. No acute intracranial abnormality.
Specifically, no hemorrhage, hydrocephalus, mass lesion, acute
infarction, or significant intracranial injury.

Vascular: No hyperdense vessel or unexpected calcification.

Skull: No acute calvarial abnormality.

Sinuses/Orbits: No acute findings

Other: None
IMPRESSION: Old left cerebellar infarct.

Atrophy, chronic microvascular disease.

No acute intracranial abnormality.

## 2021-04-13 NOTE — ED Notes (Signed)
Report given to [facility] at [facility].  Patient leaving at this time.

## 2021-04-13 NOTE — ED Notes (Signed)
Patient to come to room after MRI

## 2021-04-13 NOTE — ED Triage Notes (Signed)
Hx of stroke, feeling dizziness and unbalanced at work.  Difficulty with word phrasing in triage.  Provider at bedside.

## 2021-04-13 NOTE — ED Provider Notes (Signed)
[facility] EMERGENCY DEPT Provider Note   CSN: [id number] Arrival date & time: 04/13/21  1605     History Chief Complaint  Patient presents with   Dizziness    Caleb Cardenas is a 65 y.o. male presenting for evaluation of dizziness and left-sided weakness.  Patient states at noon today he felt dizzy, like a dark cloud was going over his head, and then developed left-sided weakness.  He is having difficulty walking due to left-sided weakness.  This lasted for several hours before it started to resolve.  On arrival to the ER, all symptoms have resolved except a feeling of cloudiness in his head.  He states he is the symptoms he had last time he had a stroke, about a year ago.  He denies vision changes, slurred speech, fevers, chills, chest pain, nausea, vomiting, abdominal pain.  No recent medication changes.  He has seen to cardiologist recently about his heart, his set to get a loop recorder soon to look for arrhythmias.  He does follow-up outpatient with [facility] neurology, is on Xarelto.  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   Past Surgical History:  Procedure Laterality Date   KNEE SURGERY     x2  in high school       Family History  Problem Relation Age of Onset   Heart attack Mother    Alzheimer's disease Father    Pancreatic cancer Brother     Social History   Tobacco Use   Smoking status: Every Day    Packs/day: 0.50    Types: Cigarettes    Passive exposure: Never   Smokeless tobacco: Never  Vaping Use   Vaping Use: Never used  Substance Use Topics   Alcohol use: Yes    Comment: occ   Drug use: No    Home Medications Prior to Admission medications   Medication Sig Start Date End Date Taking? Authorizing Provider  atorvastatin (LIPITOR) 20 MG tablet Take 20 mg by mouth at bedtime.  12/19/20   [provider]  levofloxacin (LEVAQUIN) 500 MG tablet Take 1 tablet (500 mg total) by mouth daily. 09/25/20   Carmin Muskrat, MD  lisinopril-hydrochlorothiazide (PRINZIDE,ZESTORETIC) 10-12.5 MG tablet Take 1 tablet by mouth daily.    [provider]  Multiple Vitamin (MULTIVITAMIN ADULT) TABS Take 1 tablet by mouth 3 (three) times a week.    [provider]  XARELTO 2.5 MG TABS tablet Take 2.5 mg by mouth 2 (two) times daily. 12/22/20   [provider]    Allergies    Patient has no known allergies.  Review of Systems   Review of Systems  Neurological:  Positive for dizziness and weakness (L sided, resolved).  All other systems reviewed and are negative.  Physical Exam Updated Vital Signs BP (!) 168/74 (BP Location: Right Arm)   Pulse 77   Temp 98.4 F (36.9 C) (Oral)   Resp 18   Ht 5\' 10"  (1.778 m)   Wt 72.6 kg   SpO2 99%   BMI 22.96 kg/m   Physical Exam Vitals and nursing note reviewed.  Constitutional:      General: He is not in acute distress.    Appearance: Normal appearance.  HENT:     Head: Normocephalic and atraumatic.  Eyes:  Conjunctiva/sclera: Conjunctivae normal.     Pupils: Pupils are equal, round, and reactive to light.  Cardiovascular:     Rate and Rhythm: Normal rate and regular rhythm.     Pulses: Normal pulses.  Pulmonary:     Effort: Pulmonary effort is normal. No respiratory distress.     Breath sounds: Normal breath sounds. No wheezing.     Comments: Speaking in full sentences.  Clear lung sounds in all fields. Abdominal:     General: There is no distension.     Palpations: Abdomen is soft.     Tenderness: There is no abdominal tenderness.  Musculoskeletal:        General: Normal range of motion.     Cervical back: Normal range of motion and neck supple.  Skin:    General: Skin is warm and dry.     Capillary Refill: Capillary refill takes less than 2 seconds.  Neurological:     Mental Status:  He is alert and oriented to person, place, and time.     GCS: GCS eye subscore is 4. GCS verbal subscore is 5. GCS motor subscore is 6.     Cranial Nerves: Cranial nerves 2-12 are intact.     Sensory: Sensation is intact.     Motor: Motor function is intact.     Coordination: Coordination is intact.     Gait: Gait is intact.     Comments: No neuro deficits on my exam  Psychiatric:        Mood and Affect: Mood and affect normal.        Speech: Speech normal.        Behavior: Behavior normal.    ED Results / Procedures / Treatments   Labs (all labs ordered are listed, but only abnormal results are displayed) Labs Reviewed  CBC - Abnormal; Notable for the following components:      Result Value   WBC 11.6 (*)    All other components within normal limits  COMPREHENSIVE METABOLIC PANEL - Abnormal; Notable for the following components:   Sodium 134 (*)    Potassium 3.4 (*)    CO2 20 (*)    BUN 25 (*)    Creatinine, Ser 1.39 (*)    GFR, Estimated 56 (*)    All other components within normal limits  URINALYSIS, ROUTINE W REFLEX MICROSCOPIC - Abnormal; Notable for the following components:   Color, Urine COLORLESS (*)    Specific Gravity, Urine <1.005 (*)    All other components within normal limits  CBG MONITORING, ED - Abnormal; Notable for the following components:   Glucose-Capillary 109 (*)    All other components within normal limits  RESP PANEL BY RT-PCR (FLU A&B, COVID) ARPGX2  ETHANOL  DIFFERENTIAL  RAPID URINE DRUG SCREEN, HOSP PERFORMED  PROTIME-INR  APTT    EKG None  Radiology CT HEAD WO CONTRAST  Result Date: 04/13/2021 CLINICAL DATA:  Neuro deficit, acute, stroke suspected.  Dizziness EXAM: CT HEAD WITHOUT CONTRAST TECHNIQUE: Contiguous axial images were obtained from the base of the skull through the vertex without intravenous contrast. COMPARISON:  04/01/2020 FINDINGS: Brain: Old left cerebellar infarct. There is atrophy and chronic small vessel disease  changes. No acute intracranial abnormality. Specifically, no hemorrhage, hydrocephalus, mass lesion, acute infarction, or significant intracranial injury. Vascular: No hyperdense vessel or unexpected calcification. Skull: No acute calvarial abnormality. Sinuses/Orbits: No acute findings Other: None IMPRESSION: Old left cerebellar infarct. Atrophy, chronic microvascular disease. No acute intracranial abnormality. Electronically Signed  By: Rolm Baptise M.D.   On: 04/13/2021 17:24    Procedures Procedures   Medications Ordered in ED Medications - No data to display  ED Course  I have reviewed the triage vital signs and the nursing notes.  Pertinent labs & imaging results that were available during my care of the patient were reviewed by me and considered in my medical decision making (see chart for details).    MDM Rules/Calculators/A&P                           Patient presenting for evaluation of dizziness and left-sided weakness.  On exam, patient peers nontoxic.  By the time my evaluation, symptoms have resolved.  He is outside the 3 to 4-hour window, no code stroke activated.  However in the setting of acute neurologic deficits and previous history, will obtain stroke work-up.  Labs interpreted by me, overall reassuring.  CT head negative.  Patient will need an MRI for further evaluation of possible stroke, but unfortunately this is not available at this facility.  Will consult with neurology.  Discussed with Dr. Saralyn Pilar who is agreeable to getting an MRI.  Recommends patient be discharged with outpatient follow-up if MRI is negative.  Discussed with Dr. Pearline Cables at the [facility] emergency department, who accepts patient for transfer.  Discussed plan with patient and brother.  Patient like to go POV with brother, instructed him to go straight to the ER and to not stop on the way.  Final Clinical Impression(s) / ED Diagnoses Final diagnoses:  Dizziness  Left-sided weakness    Rx /  DC Orders ED Discharge Orders     None        Franchot Heidelberg, PA-C 04/13/21 2051    Tegeler, Gwenyth Allegra, MD 04/14/21 [phone]

## 2021-04-13 NOTE — Discharge Instructions (Addendum)
Our neurologist recommended that you increase your atorvastatin dose from 20 mg to 40 mg daily.  You should follow-up with your cardiologist and your neurologist for this visit.  We talked about your MRI findings.  These show signs of your old stroke in the back of the brain (the balance center).  Sometimes these old stroke symptoms can flareup, particularly when you are dehydrated, your blood pressure drops, and can also happen after you are craning her neck at work for long period of time.  If you do feel yourself getting dizzy, try to lay her self down, drink some water, and see if the symptoms will pass.  If you develop any other symptoms you have not had before, including difficulty speaking, loss of vision, severe headache, or other stroke symptoms, please call 911 and come back to the ER immediately.  Your doctor or neurologist should recheck your lipid blood tests this week if possible.

## 2021-04-14 MED ORDER — ATORVASTATIN CALCIUM 20 MG PO TABS: 40.0000 mg | ORAL_TABLET | Freq: Every day | ORAL | 0 refills | Status: DC

## 2021-04-14 NOTE — ED Notes (Signed)
DC instructions reviewed with pt. PT verbalized understanding. PT DC °

## 2021-04-14 NOTE — ED Provider Notes (Signed)
Patient arrives in the freestanding ER as a transfer pending MRI scan.  He presents with concern for dizziness and listing to the left side.  He reports he has had similar symptoms on and off for the past years since he was diagnosed with a PICA stroke.  He feels that the symptoms worsened 2 weeks ago, with an episode of vomiting.  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  MRI scan today shows prior infarct, very small right cerebellar infarct, per my discussion with the reading neuroradiologist, these smaller infarcts are not acute, but may not have been visualized last year on initial brain imaging.  However they may have been present at the time but simply not visualized.  The patient is on Xarelto 2.5 mg twice a day for PVD.   He is also pending evaluation with a cardiologist for possible arrhythmia (has LINQ recorder coming in the mail).  No known hx of A Fib.  Echo last year was unremarkable.  He is on atorvastatin 20 mg daily.  I discussed the case and imaging with Dr Leonel Ramsay from neurology who advised that we increase the patient's statin dosing from 20 to 40 mg daily, that the patient's PCP recheck a lipid profile, and that the patient to follow-up with his neurologist.  Because he is already on Xarelto, we will not initiate any other types of blood thinners at this time.  The patient has very minimal symptoms my exam, is able to ambulate steadily, and has no gross ataxia.  Both he and his brother at bedside are in agreement with the plan and prefer to go home at this time.  We discussed the MRI imaging, the possible recrudescence, and how to manage his symptoms moving forward.  They verbalized understanding.   Wyvonnia Dusky, MD 04/14/21 [phone]

## 2021-04-19 ENCOUNTER — Emergency Department (HOSPITAL_COMMUNITY): Payer: Medicare HMO

## 2021-04-19 ENCOUNTER — Encounter (HOSPITAL_COMMUNITY): Payer: Self-pay | Admitting: Emergency Medicine

## 2021-04-19 ENCOUNTER — Telehealth: Payer: Self-pay | Admitting: Diagnostic Neuroimaging

## 2021-04-19 ENCOUNTER — Other Ambulatory Visit: Payer: Self-pay

## 2021-04-19 ENCOUNTER — Observation Stay (HOSPITAL_COMMUNITY)
Admission: EM | Admit: 2021-04-19 | Discharge: 2021-04-20 | Disposition: A | Discharge status: Home / Self Care | Payer: Medicare HMO | Attending: Internal Medicine | Admitting: Internal Medicine

## 2021-04-19 ENCOUNTER — Encounter: Payer: Self-pay | Admitting: Diagnostic Neuroimaging

## 2021-04-19 ENCOUNTER — Ambulatory Visit: Payer: Medicare HMO | Admitting: Diagnostic Neuroimaging

## 2021-04-19 VITALS — BP 135/81 | HR 63 | Ht 70.0 in | Wt 160.0 lb

## 2021-04-19 DIAGNOSIS — N183 Chronic kidney disease, stage 3 unspecified: Secondary | ICD-10-CM | POA: Diagnosis not present

## 2021-04-19 DIAGNOSIS — I129 Hypertensive chronic kidney disease with stage 1 through stage 4 chronic kidney disease, or unspecified chronic kidney disease: Secondary | ICD-10-CM | POA: Diagnosis not present

## 2021-04-19 DIAGNOSIS — Z20822 Contact with and (suspected) exposure to covid-19: Secondary | ICD-10-CM | POA: Insufficient documentation

## 2021-04-19 DIAGNOSIS — G450 Vertebro-basilar artery syndrome: Secondary | ICD-10-CM

## 2021-04-19 DIAGNOSIS — Z7901 Long term (current) use of anticoagulants: Secondary | ICD-10-CM | POA: Insufficient documentation

## 2021-04-19 DIAGNOSIS — J9859 Other diseases of mediastinum, not elsewhere classified: Secondary | ICD-10-CM | POA: Diagnosis not present

## 2021-04-19 DIAGNOSIS — I10 Essential (primary) hypertension: Secondary | ICD-10-CM

## 2021-04-19 DIAGNOSIS — R42 Dizziness and giddiness: Secondary | ICD-10-CM

## 2021-04-19 DIAGNOSIS — I63433 Cerebral infarction due to embolism of bilateral posterior cerebral arteries: Secondary | ICD-10-CM

## 2021-04-19 DIAGNOSIS — F17210 Nicotine dependence, cigarettes, uncomplicated: Secondary | ICD-10-CM | POA: Insufficient documentation

## 2021-04-19 DIAGNOSIS — Z79899 Other long term (current) drug therapy: Secondary | ICD-10-CM | POA: Diagnosis not present

## 2021-04-19 DIAGNOSIS — I160 Hypertensive urgency: Principal | ICD-10-CM | POA: Diagnosis present

## 2021-04-19 LAB — COMPREHENSIVE METABOLIC PANEL
ALT: 21 U/L (ref 0–44)
AST: 23 U/L (ref 15–41)
Albumin: 3.8 g/dL (ref 3.5–5.0)
Alkaline Phosphatase: 84 U/L (ref 38–126)
Anion gap: 10 (ref 5–15)
BUN: 24 mg/dL — ABNORMAL HIGH (ref 8–23)
CO2: 21 mmol/L — ABNORMAL LOW (ref 22–32)
Calcium: 8.8 mg/dL — ABNORMAL LOW (ref 8.9–10.3)
Chloride: 106 mmol/L (ref 98–111)
Creatinine, Ser: 1.5 mg/dL — ABNORMAL HIGH (ref 0.61–1.24)
GFR, Estimated: 51 mL/min — ABNORMAL LOW (ref >60)
Glucose, Bld: 126 mg/dL — ABNORMAL HIGH (ref 70–99)
Potassium: 3.5 mmol/L (ref 3.5–5.1)
Sodium: 137 mmol/L (ref 135–145)
Total Bilirubin: 0.9 mg/dL (ref 0.3–1.2)
Total Protein: 6.8 g/dL (ref 6.5–8.1)

## 2021-04-19 LAB — DIFFERENTIAL
Abs Immature Granulocytes: 0.05 10*3/uL (ref 0.00–0.07)
Basophils Absolute: 0.1 10*3/uL (ref 0.0–0.1)
Basophils Relative: 1 %
Eosinophils Absolute: 0.2 10*3/uL (ref 0.0–0.5)
Eosinophils Relative: 1 %
Immature Granulocytes: 0 %
Lymphocytes Relative: 39 %
Lymphs Abs: 4.5 10*3/uL — ABNORMAL HIGH (ref 0.7–4.0)
Monocytes Absolute: 0.7 10*3/uL (ref 0.1–1.0)
Monocytes Relative: 6 %
Neutro Abs: 6.2 10*3/uL (ref 1.7–7.7)
Neutrophils Relative %: 53 %

## 2021-04-19 LAB — I-STAT CHEM 8, ED
BUN: 25 mg/dL — ABNORMAL HIGH (ref 8–23)
Calcium, Ion: 1 mmol/L — ABNORMAL LOW (ref 1.15–1.40)
Chloride: 105 mmol/L (ref 98–111)
Creatinine, Ser: 1.4 mg/dL — ABNORMAL HIGH (ref 0.61–1.24)
Glucose, Bld: 124 mg/dL — ABNORMAL HIGH (ref 70–99)
HCT: 44 % (ref 39.0–52.0)
Hemoglobin: 15 g/dL (ref 13.0–17.0)
Potassium: 3.4 mmol/L — ABNORMAL LOW (ref 3.5–5.1)
Sodium: 139 mmol/L (ref 135–145)
TCO2: 20 mmol/L — ABNORMAL LOW (ref 22–32)

## 2021-04-19 LAB — CBC
HCT: 44 % (ref 39.0–52.0)
Hemoglobin: 15.1 g/dL (ref 13.0–17.0)
MCH: 30.4 pg (ref 26.0–34.0)
MCHC: 34.3 g/dL (ref 30.0–36.0)
MCV: 88.7 fL (ref 80.0–100.0)
Platelets: 225 10*3/uL (ref 150–400)
RBC: 4.96 MIL/uL (ref 4.22–5.81)
RDW: 13.7 % (ref 11.5–15.5)
WBC: 11.8 10*3/uL — ABNORMAL HIGH (ref 4.0–10.5)
nRBC: 0 % (ref 0.0–0.2)

## 2021-04-19 LAB — PROTIME-INR
INR: 1 (ref 0.8–1.2)
Prothrombin Time: 13 seconds (ref 11.4–15.2)

## 2021-04-19 LAB — RESP PANEL BY RT-PCR (FLU A&B, COVID) ARPGX2
Influenza A by PCR: NEGATIVE
Influenza B by PCR: NEGATIVE
SARS Coronavirus 2 by RT PCR: NEGATIVE

## 2021-04-19 LAB — CBG MONITORING, ED: Glucose-Capillary: 126 mg/dL — ABNORMAL HIGH (ref 70–99)

## 2021-04-19 LAB — APTT: aPTT: 27 seconds (ref 24–36)

## 2021-04-19 IMAGING — CT CT ANGIO HEAD-NECK (W OR W/O PERF)
2 of 7 series · 8 of 33 positions shown · IV contrast (OMNI 350)
Comparison: MRA [DATE].

CLINICAL DATA: Neuro deficit, acute, stroke suspected

EXAM:
CT ANGIOGRAPHY HEAD AND NECK
TECHNIQUE: Multidetector CT imaging of the head and neck was performed using
the standard protocol during bolus administration of intravenous
contrast. Multiplanar CT image reconstructions and MIPs were
obtained to evaluate the vascular anatomy. Carotid stenosis
measurements (when applicable) are obtained utilizing NASCET
criteria, using the distal internal carotid diameter as the
denominator.
CONTRAST:  75mL OMNIPAQUE IOHEXOL 350 MG/ML SOLN

[Series 5: cta neck (person_name) · axial · 0.55mm/px · z∈[-206,-80]mm · 2 of 189 slices shown]
[im 63/189  soft-tissue]
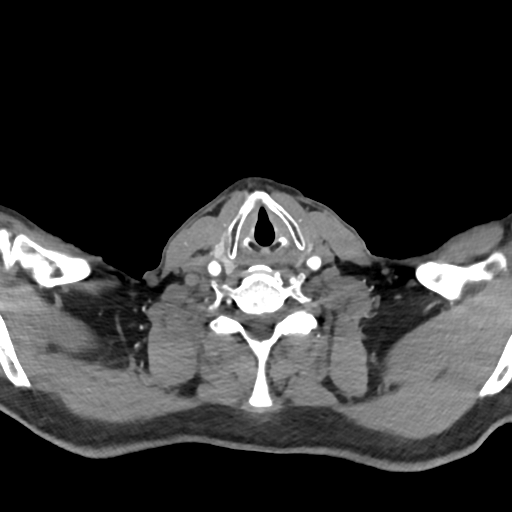
[im 126/189  bone]
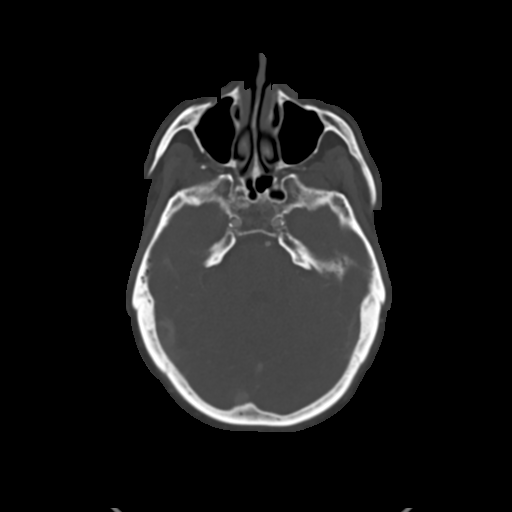

[Series 10: cta neck axial · axial · 0.39mm/px · z∈[-277,-11]mm · 6 of 374 slices shown]
[im 54/374  soft-tissue]
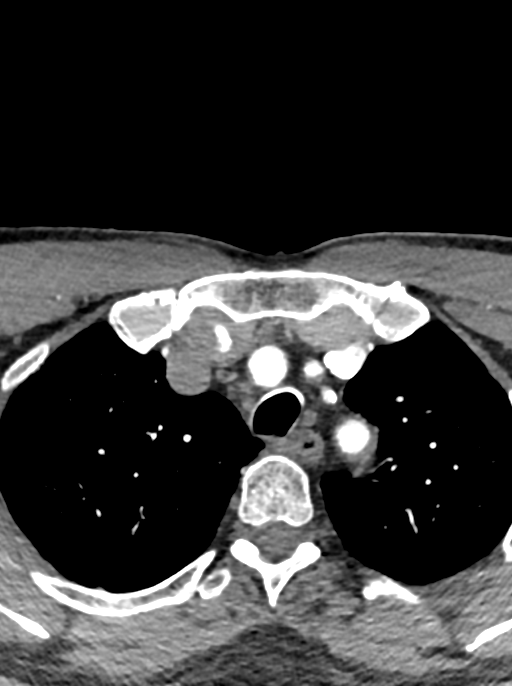
[im 107/374  soft-tissue]
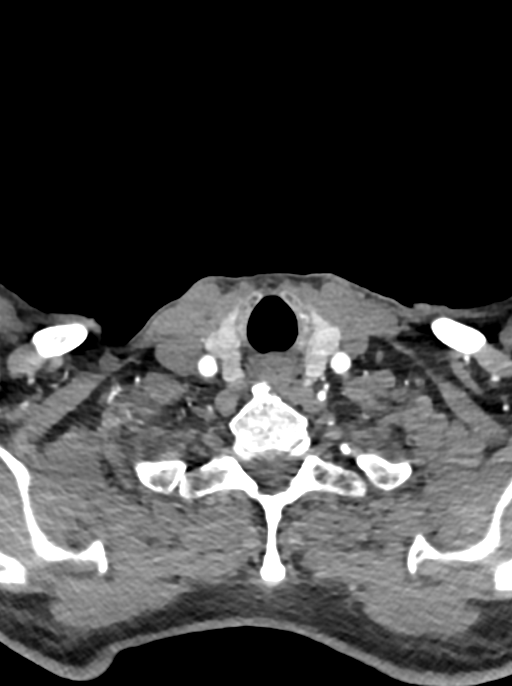
[im 160/374  soft-tissue]
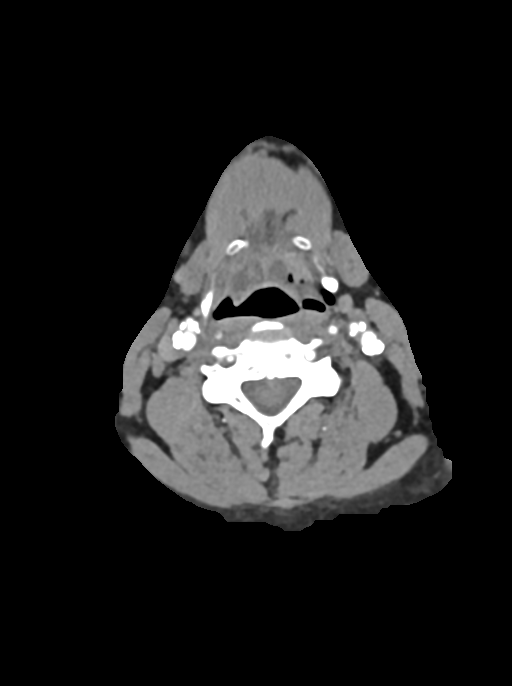
[im 214/374  soft-tissue]
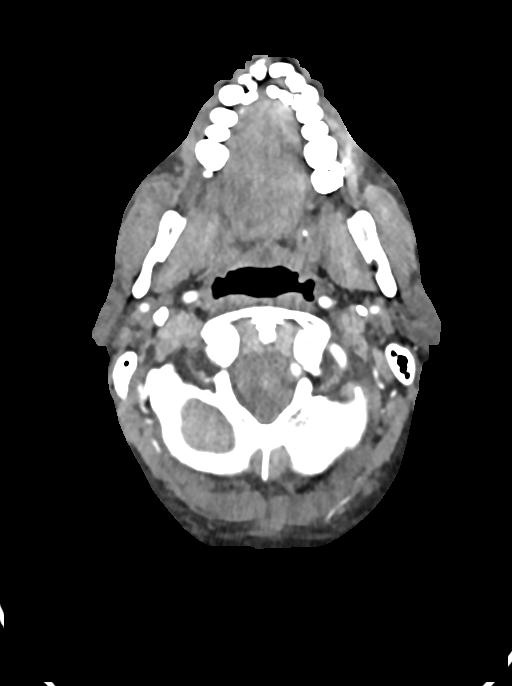
[im 267/374  soft-tissue]
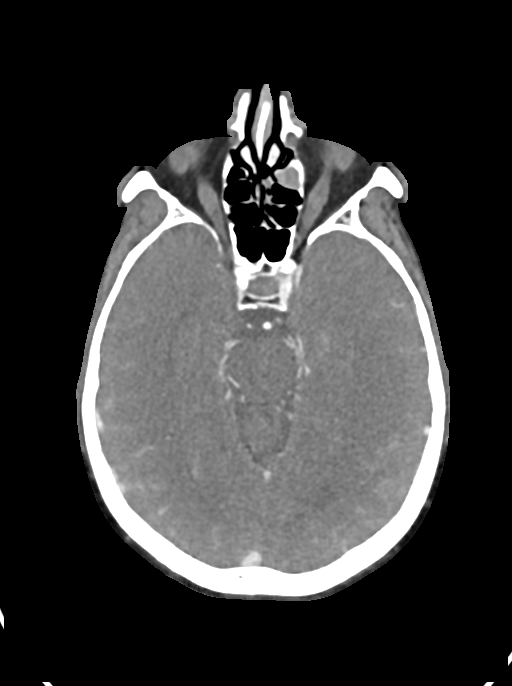
[im 320/374  soft-tissue]
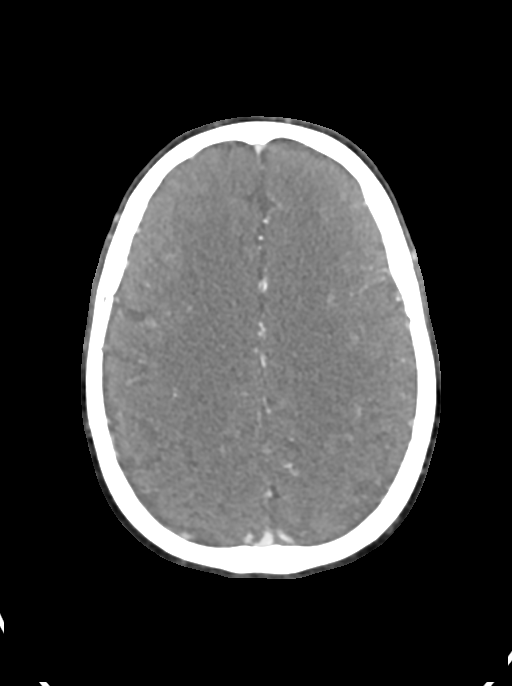

[8 of 33 positions shown; findings below may reference images not displayed]

FINDINGS: CTA NECK FINDINGS

Aortic arch: Atherosclerosis aorta great vessel origins, which are
patent. Moderate stenosis of the left common carotid artery origin
and proximal right subclavian artery.

Right carotid system: Calcific and noncalcific atherosclerosis of
the carotid bifurcation and proximal ICA without greater than 50%
stenosis relative to the distal vessel.

Left carotid system: Moderate stenosis of the common carotid artery
origin. Bulky calcific atherosclerosis at the carotid bifurcation
with approximately 40% stenosis of the proximal ICA

Vertebral arteries: Severe stenosis of the right vertebral artery
origin with poor opacification of the V2 vertebral artery and
occlusion of the V3 vertebral artery. Severe stenosis of the
proximal left vertebral artery. Remainder of the left vertebral
artery in the neck is patent.

Skeleton: Degenerative change of the cervical spine multilevel facet
arthropathy.

Other neck: No evidence of acute abnormality

Upper chest: Partially imaged mass in the anterior mediastinum which
measures 4.3 x 2.7 cm, increased in size since [IQ] CTA chest.
Bronchial wall thickening and emphysema (paraseptal and
centrallobular).

Review of the MIP images confirms the above findings

CTA HEAD FINDINGS

Anterior circulation: Calcific atherosclerosis of bilateral
intracranial ICAs with resulting mild multifocal stenosis. Bilateral
M1 MCAs are patent. Severe stenosis of a proximal right M2 MCA
branch. Left M2 MCA branches are patent without proximal high-grade
stenosis. Bilateral ACAs are patent. Severe stenosis of the distal
right A2 and A3 ACA.

Posterior circulation: Non opacified V3 vertebral artery with
suspected severe stenosis at the dural margin when correalting with
prior MRA. Faint opacification of the intradural right vertebral
artery, likely from and retrograde flow. Severe stenosis versus
short-segment occlusion of the intradural left vertebral artery.
Opacification of more distal intradural vertebral artery. Basilar
artery is patent with mild irregularity/narrowing. Bilateral
posterior cerebral arteries are small and difficult to follow
distally, but appear to be patent proximally with multifocal
stenoses there is likely severe distally on the left.

Venous sinuses: As permitted by contrast timing, patent.

Review of the MIP images confirms the above findings
IMPRESSION: CTA head:

1. Severe stenosis versus short-segment occlusion of the intradural
left vertebral artery.
2. Bilateral PCAs are small and difficult to follow distally, but
appear to be patent proximally with multifocal irregularity/stenosis
that is likely severe distally on the left.
3. Severe stenosis of a proximal right M2 MCA branch.
4. Severe stenosis of the distal right A2 and A3 ACA.

CTA Neck:

1. Severe stenosis of the right vertebral artery origin with poor
opacification of the V2 vertebral artery and non opacification of
the V3 vertebral artery. Suspected severe stenosis at the dural
margin.
2. Severe stenosis of the proximal left vertebral artery.
3. Moderate stenosis of the left common carotid artery origin and
proximal right subclavian artery.
4. Bilateral carotid bifurcation atherosclerosis with approximately
40% stenosis of the left proximal ICA.
5. Partially imaged mass in the anterior mediastinum which measures
4.3 x 2.7 cm, increased in size since [IQ] CTA chest. This could
potentially represent a slowly enlarging malignancy or abnormal
lymph node. Recommend CT chest with contrast or PET-CT for further
evaluation.
6. Aortic Atherosclerosis ([IQ]-[IQ]) and Emphysema ([IQ]-[IQ]).

Findings discussed with Dr. MARILYN via telephone at [DATE] p.m.

## 2021-04-19 IMAGING — CT CT HEAD CODE STROKE
3 series · 16 of 47 positions shown, 19 images · non-contrast
Comparison: [DATE].

CLINICAL DATA: Code stroke.  Speech and vision changes

EXAM:
CT HEAD WITHOUT CONTRAST
TECHNIQUE: Contiguous axial images were obtained from the base of the skull
through the vertex without intravenous contrast.

[Series 3: head 5.0 st · axial · 0.43mm/px · z∈[-116,+34]mm · 10 of 36 slices shown, 13 images]
[im 3/36  brain]
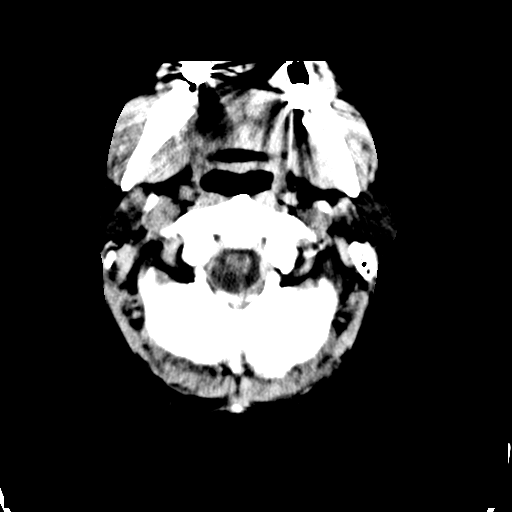
[im 3/36  bone]
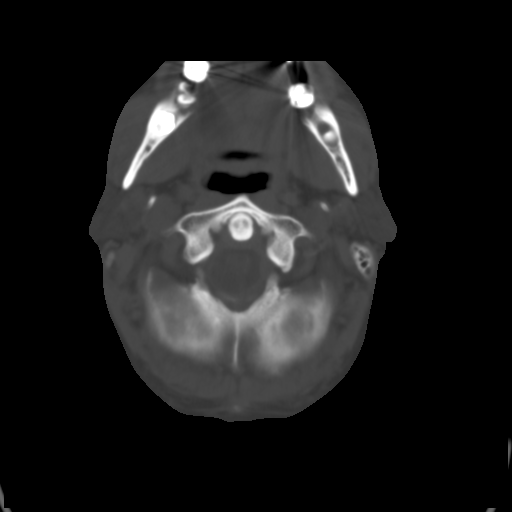
[im 7/36  brain]
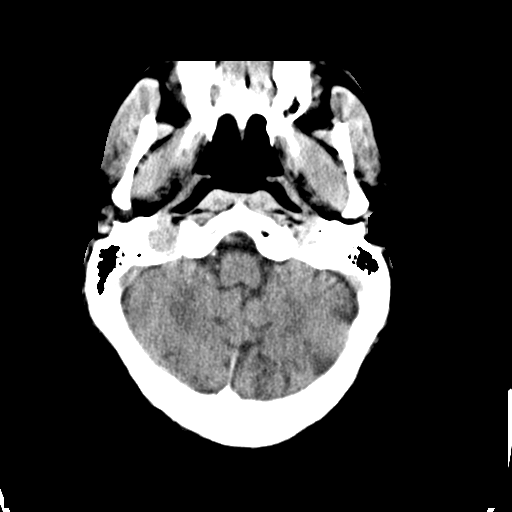
[im 10/36  brain]
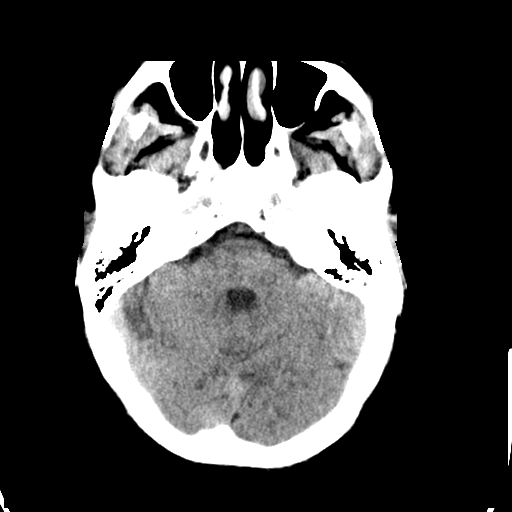
[im 13/36  brain]
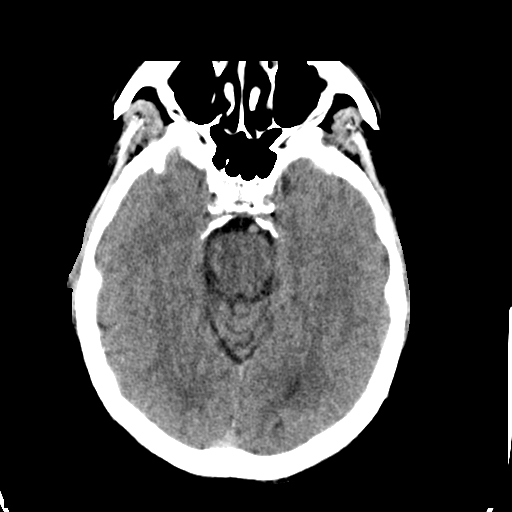
[im 16/36  brain]
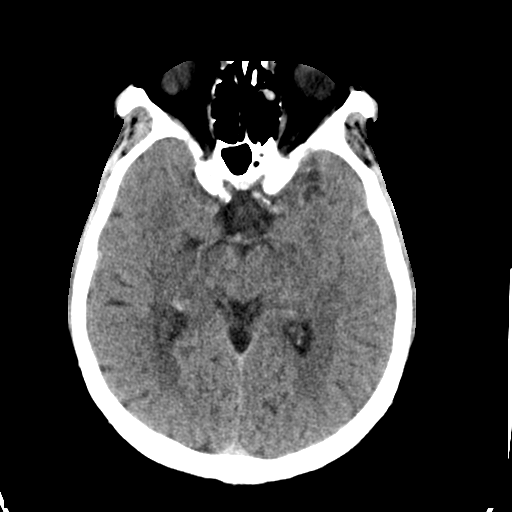
[im 16/36  bone]
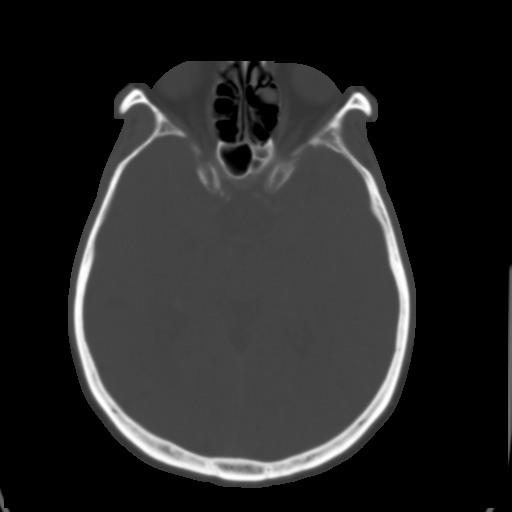
[im 20/36  brain]
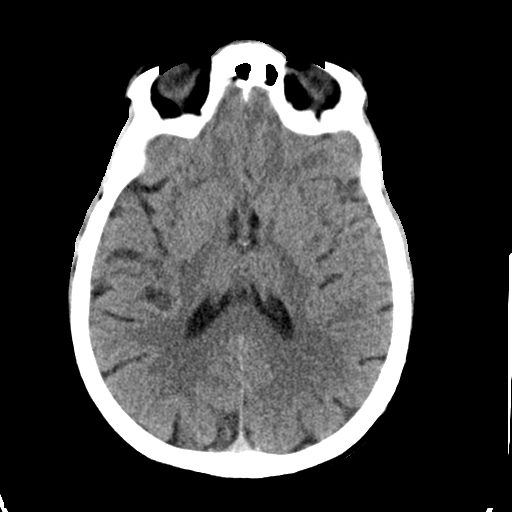
[im 23/36  brain]
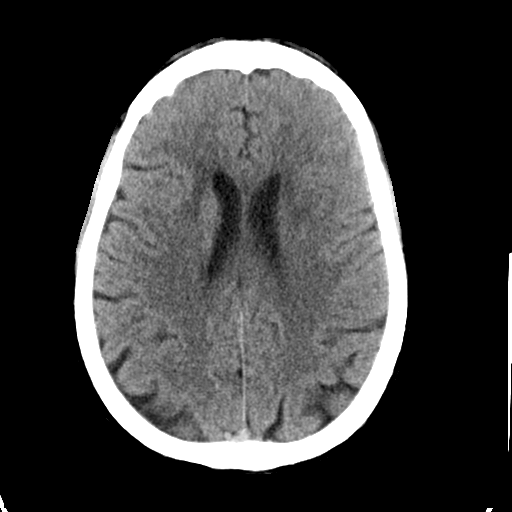
[im 27/36  brain]
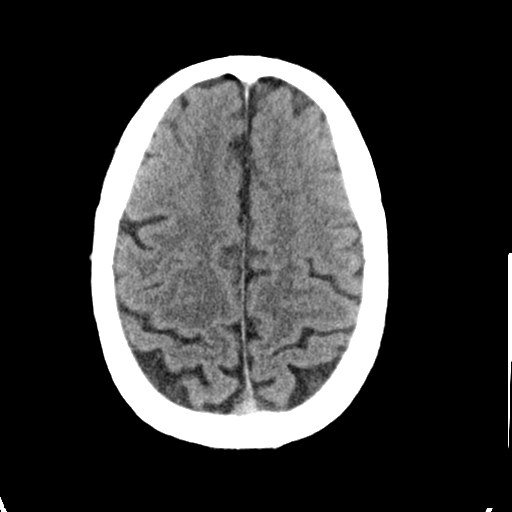
[im 29/36  brain]
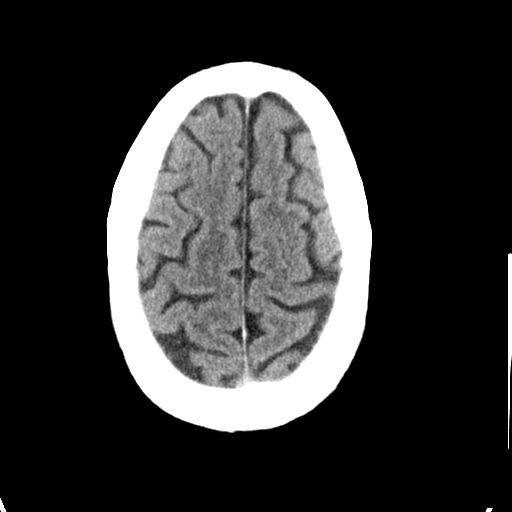
[im 29/36  bone]
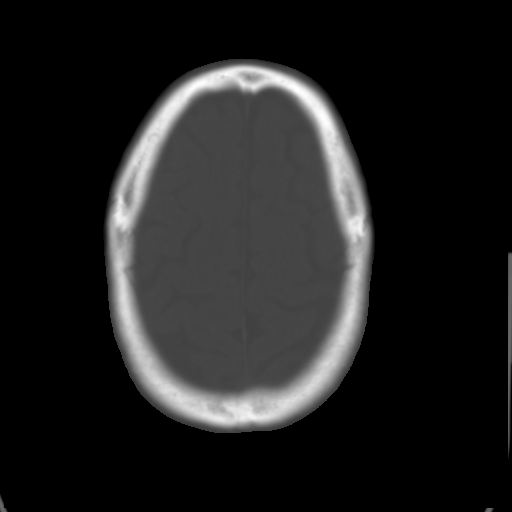
[im 33/36  brain]
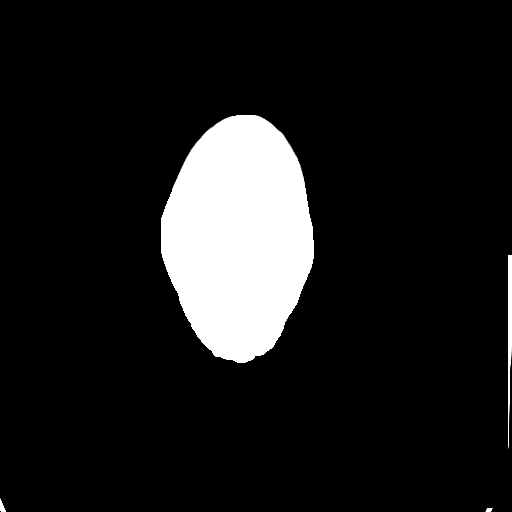

[Series 5: head 3.0 cor st · coronal · 0.33mm/px · 3 of 74 slices shown]
[im 25/74  brain]
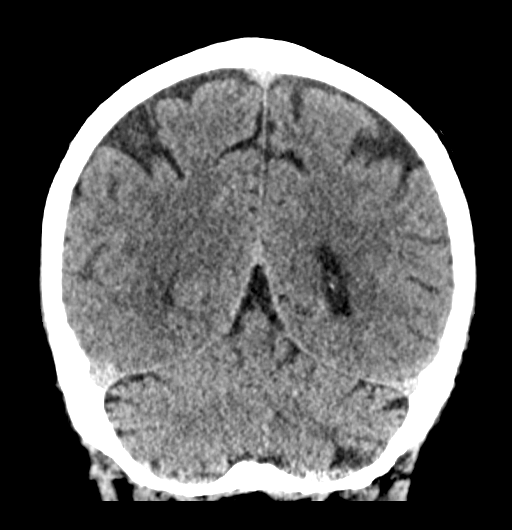
[im 33/74  brain]
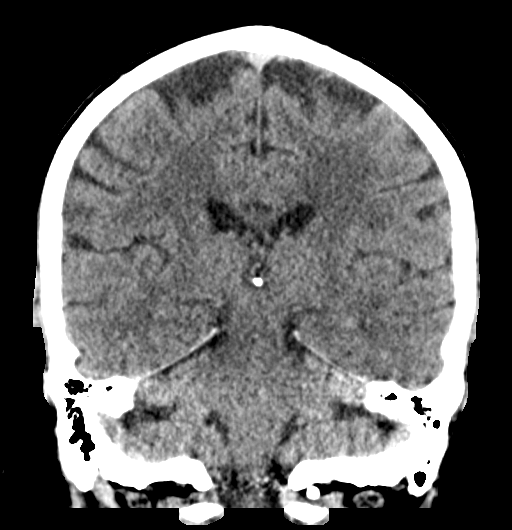
[im 41/74  brain]
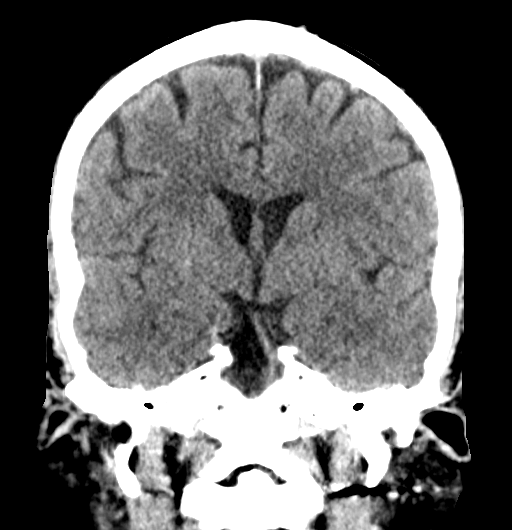

[Series 6: head 3.0 sag st · sagittal · 0.34mm/px · 3 of 56 slices shown]
[im 19/56  brain]
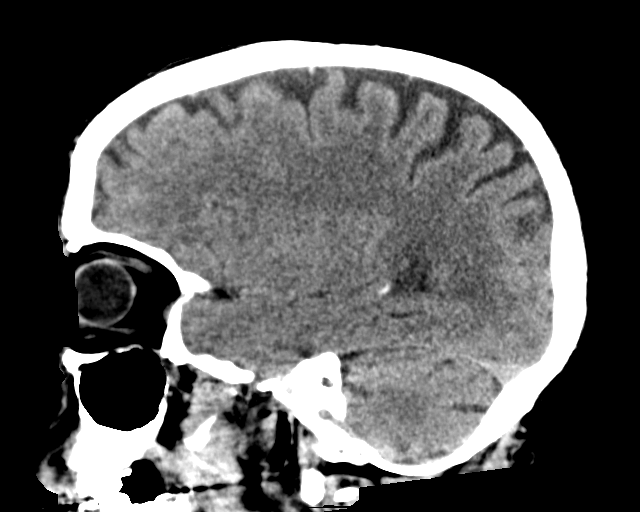
[im 28/56  brain]
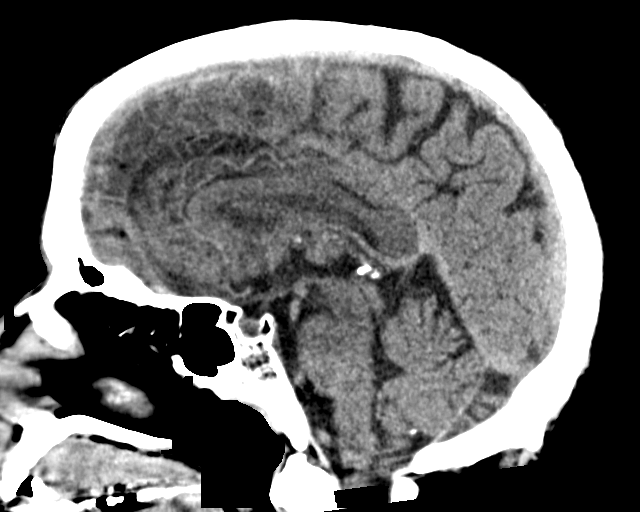
[im 37/56  brain]
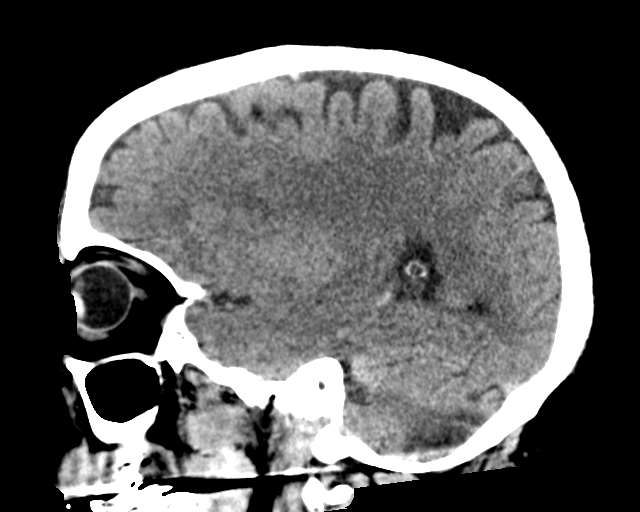

[16 of 47 positions shown; findings below may reference images not displayed]

FINDINGS: Brain: No evidence of acute infarction, hemorrhage, cerebral edema,
mass, mass effect, or midline shift. Ventricles and sulci are normal
for age. No extra-axial fluid collection.

Vascular: No hyperdense vessel or unexpected calcification.

Skull: Normal. Negative for fracture or focal lesion.

Sinuses/Orbits: Mild mucosal thickening in the ethmoid air cells.
The orbits are unremarkable.

Other: The mastoid air cells are well aerated.

ASPECTS ([place]ber[place] Stroke Program Early CT Score)

- Ganglionic level infarction (caudate, lentiform nuclei, internal
capsule, insula, M1-M3 cortex): 7

- Supraganglionic infarction (M4-M6 cortex): 3

Total score (0-10 with 10 being normal): 10
IMPRESSION: 1. No acute intracranial process.
2. ASPECTS is 10

Code stroke imaging results were communicated on [DATE] at [DATE] to provider Dr. MARIKA via secure text paging.

## 2021-04-19 MED ORDER — RIVAROXABAN 2.5 MG PO TABS
2.5000 mg | ORAL_TABLET | Freq: Two times a day (BID) | ORAL | Status: DC
  Administered 2021-04-19: 2.5 mg via ORAL
  Filled 2021-04-19 (×4): qty 1

## 2021-04-19 MED ORDER — ACETAMINOPHEN 650 MG RE SUPP: 650.0000 mg | Freq: Four times a day (QID) | RECTAL | Status: DC | PRN

## 2021-04-19 MED ORDER — ACETAMINOPHEN 325 MG PO TABS: 650.0000 mg | ORAL_TABLET | Freq: Four times a day (QID) | ORAL | Status: DC | PRN

## 2021-04-19 MED ORDER — IOHEXOL 350 MG/ML SOLN
75.0000 mL | Freq: Once | INTRAVENOUS | Status: AC | PRN
  Administered 2021-04-19: 75 mL via INTRAVENOUS

## 2021-04-19 MED ORDER — DIAZEPAM 2 MG PO TABS
2.0000 mg | ORAL_TABLET | Freq: Once | ORAL | Status: AC
  Administered 2021-04-19: 2 mg via ORAL
  Filled 2021-04-19: qty 1

## 2021-04-19 MED ORDER — SODIUM CHLORIDE 0.9 % IV SOLN: INTRAVENOUS | Status: DC

## 2021-04-19 MED ORDER — HYDRALAZINE HCL 20 MG/ML IJ SOLN: 10.0000 mg | INTRAMUSCULAR | Status: DC | PRN

## 2021-04-19 MED ORDER — LISINOPRIL 10 MG PO TABS
10.0000 mg | ORAL_TABLET | Freq: Every day | ORAL | Status: DC
  Administered 2021-04-19: 10 mg via ORAL
  Filled 2021-04-19: qty 1

## 2021-04-19 MED ORDER — LISINOPRIL-HYDROCHLOROTHIAZIDE 10-12.5 MG PO TABS: 1.0000 | ORAL_TABLET | Freq: Every day | ORAL | Status: DC

## 2021-04-19 MED ORDER — ATORVASTATIN CALCIUM 40 MG PO TABS
40.0000 mg | ORAL_TABLET | Freq: Every day | ORAL | Status: DC
  Administered 2021-04-19: 40 mg via ORAL
  Filled 2021-04-19: qty 1

## 2021-04-19 MED ORDER — SODIUM CHLORIDE 0.9% FLUSH
3.0000 mL | Freq: Once | INTRAVENOUS | Status: AC
Start: 2021-04-19 — End: 2021-04-19
  Administered 2021-04-19: 3 mL via INTRAVENOUS

## 2021-04-19 NOTE — ED Provider Notes (Signed)
[facility] EMERGENCY DEPARTMENT Provider Note   CSN: [id number] Arrival date & time: 04/19/21  1739  An emergency department physician performed an initial assessment on this suspected stroke patient at 1740.  History Chief Complaint  Patient presents with   Code Stroke    Caleb Cardenas is a 65 y.o. male.  HPI Patient came in as a code stroke.  Reportedly has had dizziness.  Also some vision changes.  Has been on and off for a while now.  Had Bactrim and seen in the ER around 7 days ago and had negative head CT and MRI for acute stroke.  Does have some vertebral narrowing however.  Seen at neurology today.  And had plans for outpatient CTA.  However had worsening symptoms.  Some blurred vision.  Some dizziness.  States he felt off.  Similar to previous symptoms.  Found to have a blood pressure 209 systolic by EMS.  Had been more normotensive for neurology earlier today.  Is on a low-dose Xarelto for peripheral vascular disease.  Had been off it for 2 days due to planned dental surgery.  Last reported normal was 415 although has had episodes on and off for the last weeks.    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   Past Surgical History:  Procedure Laterality Date   KNEE SURGERY     x2  in high school       Family History  Problem Relation Age of Onset   Heart attack Mother    Alzheimer's disease Father    Pancreatic cancer Brother     Social History   Tobacco Use   Smoking status: Every Day    Packs/day: 0.50    Types: Cigarettes    Passive exposure: Never   Smokeless tobacco: Never  Vaping Use   Vaping Use: Never used  Substance Use Topics   Alcohol use: Yes    Comment: occ   Drug use: No    Home Medications Prior to Admission medications   Medication Sig Start Date End Date Taking? Authorizing  Provider  atorvastatin (LIPITOR) 20 MG tablet Take 2 tablets (40 mg total) by mouth at bedtime. 04/14/21 05/14/21  Wyvonnia Dusky, MD  lisinopril-hydrochlorothiazide (PRINZIDE,ZESTORETIC) 10-12.5 MG tablet Take 1 tablet by mouth daily.    [provider]  Multiple Vitamin (MULTIVITAMIN ADULT) TABS Take 1 tablet by mouth 3 (three) times a week.    [provider]  XARELTO 2.5 MG TABS tablet Take 2.5 mg by mouth 2 (two) times daily. 12/22/20   [provider]    Allergies    Patient has no known allergies.  Review of Systems   Review of Systems  Constitutional:  Negative for appetite change.  HENT:  Positive for dental problem.   Eyes:  Positive for visual disturbance.  Respiratory:  Negative for shortness of breath.   Cardiovascular:  Negative for chest pain.  Gastrointestinal:  Negative for abdominal pain.  Genitourinary:  Negative for flank pain.  Musculoskeletal:  Negative for back pain.  Neurological:  Positive for dizziness.  Psychiatric/Behavioral:  The patient is nervous/anxious.    Physical Exam Updated Vital Signs BP (!) 126/106   Pulse 66   Temp 98.4 F (36.9 C) (Oral)   Resp (!) 25   Wt 71.8 kg  SpO2 98%   BMI 22.71 kg/m   Physical Exam Vitals reviewed.  Constitutional:      Appearance: Normal appearance.  HENT:     Head: Atraumatic.  Eyes:     Pupils: Pupils are equal, round, and reactive to light.  Cardiovascular:     Rate and Rhythm: Regular rhythm.  Pulmonary:     Effort: Pulmonary effort is normal.  Abdominal:     Tenderness: There is no abdominal tenderness.  Musculoskeletal:        General: No tenderness.     Cervical back: Neck supple.  Skin:    Capillary Refill: Capillary refill takes less than 2 seconds.  Neurological:     Mental Status: He is oriented to person, place, and time.     Comments: Complete NIH scoring done by neurology.  Mostly nonfocal exam however    ED Results / Procedures / Treatments    Labs (all labs ordered are listed, but only abnormal results are displayed) Labs Reviewed  CBC - Abnormal; Notable for the following components:      Result Value   WBC 11.8 (*)    All other components within normal limits  DIFFERENTIAL - Abnormal; Notable for the following components:   Lymphs Abs 4.5 (*)    All other components within normal limits  COMPREHENSIVE METABOLIC PANEL - Abnormal; Notable for the following components:   CO2 21 (*)    Glucose, Bld 126 (*)    BUN 24 (*)    Creatinine, Ser 1.50 (*)    Calcium 8.8 (*)    GFR, Estimated 51 (*)    All other components within normal limits  I-STAT CHEM 8, ED - Abnormal; Notable for the following components:   Potassium 3.4 (*)    BUN 25 (*)    Creatinine, Ser 1.40 (*)    Glucose, Bld 124 (*)    Calcium, Ion 1.00 (*)    TCO2 20 (*)    All other components within normal limits  CBG MONITORING, ED - Abnormal; Notable for the following components:   Glucose-Capillary 126 (*)    All other components within normal limits  PROTIME-INR  APTT    EKG None  Radiology CT HEAD CODE STROKE WO CONTRAST  Result Date: 04/19/2021 CLINICAL DATA:  Code stroke.  Speech and vision changes EXAM: CT HEAD WITHOUT CONTRAST TECHNIQUE: Contiguous axial images were obtained from the base of the skull through the vertex without intravenous contrast. COMPARISON:  04/13/2021. FINDINGS: Brain: No evidence of acute infarction, hemorrhage, cerebral edema, mass, mass effect, or midline shift. Ventricles and sulci are normal for age. No extra-axial fluid collection. Vascular: No hyperdense vessel or unexpected calcification. Skull: Normal. Negative for fracture or focal lesion. Sinuses/Orbits: Mild mucosal thickening in the ethmoid air cells. The orbits are unremarkable. Other: The mastoid air cells are well aerated. ASPECTS [facility] Stroke Program Early CT Score) - Ganglionic level infarction (caudate, lentiform nuclei, internal capsule, insula, M1-M3  cortex): 7 - Supraganglionic infarction (M4-M6 cortex): 3 Total score (0-10 with 10 being normal): 10 IMPRESSION: 1. No acute intracranial process. 2. ASPECTS is 10 Code stroke imaging results were communicated on 04/19/2021 at 5:54 pm to provider Dr. Rory Percy via secure text paging. Electronically Signed   By: Merilyn Baba M.D.   On: 04/19/2021 17:54   CT ANGIO HEAD NECK W WO CM (CODE STROKE)  Result Date: 04/19/2021 CLINICAL DATA:  Neuro deficit, acute, stroke suspected EXAM: CT ANGIOGRAPHY HEAD AND NECK TECHNIQUE: Multidetector CT imaging of  the head and neck was performed using the standard protocol during bolus administration of intravenous contrast. Multiplanar CT image reconstructions and MIPs were obtained to evaluate the vascular anatomy. Carotid stenosis measurements (when applicable) are obtained utilizing NASCET criteria, using the distal internal carotid diameter as the denominator. CONTRAST:  6mL OMNIPAQUE IOHEXOL 350 MG/ML SOLN COMPARISON:  MRA April 01, 2020. FINDINGS: CTA NECK FINDINGS Aortic arch: Atherosclerosis aorta great vessel origins, which are patent. Moderate stenosis of the left common carotid artery origin and proximal right subclavian artery. Right carotid system: Calcific and noncalcific atherosclerosis of the carotid bifurcation and proximal ICA without greater than 50% stenosis relative to the distal vessel. Left carotid system: Moderate stenosis of the common carotid artery origin. Bulky calcific atherosclerosis at the carotid bifurcation with approximately 40% stenosis of the proximal ICA Vertebral arteries: Severe stenosis of the right vertebral artery origin with poor opacification of the V2 vertebral artery and occlusion of the V3 vertebral artery. Severe stenosis of the proximal left vertebral artery. Remainder of the left vertebral artery in the neck is patent. Skeleton: Degenerative change of the cervical spine multilevel facet arthropathy. Other neck: No evidence of  acute abnormality Upper chest: Partially imaged mass in the anterior mediastinum which measures 4.3 x 2.7 cm, increased in size since 2017 CTA chest. Bronchial wall thickening and emphysema (paraseptal and centrallobular). Review of the MIP images confirms the above findings CTA HEAD FINDINGS Anterior circulation: Calcific atherosclerosis of bilateral intracranial ICAs with resulting mild multifocal stenosis. Bilateral M1 MCAs are patent. 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 Left M2 MCA branches are patent without proximal high-grade stenosis. Bilateral ACAs are patent. Severe stenosis of the distal right A2 and A3 ACA. Posterior circulation: Non opacified V3 vertebral artery with suspected severe stenosis at the dural margin when correalting with prior MRA. Faint opacification of the intradural right vertebral artery, likely from and retrograde flow. 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 Opacification of more distal intradural vertebral artery. Basilar artery is patent with mild irregularity/narrowing. Bilateral posterior cerebral arteries are small and difficult to follow distally, but appear to be patent proximally with multifocal stenoses there is likely severe distally on the left. Venous sinuses: As permitted by contrast timing, patent. Review of the MIP images confirms the above findings IMPRESSION: CTA head: 1. 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 2. Bilateral PCAs are small and difficult to follow distally, but appear to be patent proximally with multifocal irregularity/stenosis that is likely severe distally on the left. 3. 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 4. Severe stenosis of the distal right A2 and A3 ACA. CTA Neck: 1. Severe stenosis of the right vertebral artery origin with poor opacification of the V2 vertebral artery and non opacification of the V3 vertebral artery.  Suspected severe stenosis at the dural margin. 2. Severe stenosis of the proximal left vertebral artery. 3. Moderate stenosis of the left common carotid artery origin and proximal right subclavian artery. 4. Bilateral carotid bifurcation atherosclerosis with approximately 40% stenosis of the left proximal ICA. 5. Partially imaged mass in the anterior mediastinum which measures 4.3 x 2.7 cm, increased in size since 2017 CTA chest. This could potentially represent a slowly enlarging malignancy or abnormal lymph node. Recommend CT chest with contrast or PET-CT for further evaluation. 6. Aortic Atherosclerosis (ICD10-I70.0) and Emphysema (ICD10-J43.9). Findings discussed with Dr. Karin Lieu via telephone at 6:40 p.m. Electronically Signed   By: Albertina Parr  Adah Salvage M.D.   On: 04/19/2021 18:52    Procedures Procedures   Medications Ordered in ED Medications  sodium chloride flush (NS) 0.9 % injection 3 mL (3 mLs Intravenous Given 04/19/21 1800)  iohexol (OMNIPAQUE) 350 MG/ML injection 75 mL (75 mLs Intravenous Contrast Given 04/19/21 1757)  diazepam (VALIUM) tablet 2 mg (2 mg Oral Given 04/19/21 1901)    ED Course  I have reviewed the triage vital signs and the nursing notes.  Pertinent labs & imaging results that were available during my care of the patient were reviewed by me and considered in my medical decision making (see chart for details).    MDM Rules/Calculators/A&P                           Patient presented as a code stroke.  Dizziness.  Has been having episodes on and off for the last couple weeks.  Has already had a negative MRI a few days ago.  States he does not want another MRI.  Found to have some worsening posterior circulation issues.  Nothing that needs acute intervention however.  Does need tighter blood pressure control.  Also may have episodes where the high blood pressure could be causing the dizziness.  Seen by neurology.  Recommended IV fluids and Valium, however also will need  tighter blood pressure control.  I feel patient benefit from admission to the hospital for closer monitoring of the blood pressure watching for further spikes and help get better controlled. Also found to have an anterior mediastinal mass.  Increased from 5 years ago.  Found on CTA of the neck.  Will need further imaging of this could likely be done as an inpatient also. Will discuss with hospitalist Final Clinical Impression(s) / ED Diagnoses Final diagnoses:  Dizziness  Hypertension, unspecified type  Mass of mediastinum    Rx / DC Orders ED Discharge Orders     None        Davonna Belling, MD 04/19/21 1930

## 2021-04-19 NOTE — Consult Note (Signed)
Neurology Consultation  Reason for Consult: Blurred vision, dizziness Referring Physician: Dr. Alvino Chapel  CC: Blurry vision  History is obtained from: Patient, Chart review, EMS  HPI: Caleb Cardenas is a 65 y.o. male with a medical history significant for tobacco use, essential hypertension, PVD on Xarelto, and CVAs who presented to the ED via EMS for evaluation of blurry vision and dizziness with reports of headache prior to hospital arrival. He states that he was driving home from his neurology appointment to rest when he became dizzy, had to pull over, and called his brother-in-law for help. His brother-in-law subsequently activated EMS who obtained an initial blood pressure of 208/92 mmHg. Patient was seen on 10/26 in the ED with dizziness and MRI brain was obtained showing chronic infarction. Patient states that he was on Xarelto twice daily for PVD but has not taken this medication since 10/30 due to planned surgery tomorrow.   Of note, Caleb Cardenas was seen by his outpatient neurologist, Dr. Leta Baptist, today for evaluation of 3 weeks of dizzy spells with occasional vomiting, headaches, and blurred vision. These episodes have been intermittent since September of 2021 with acuate worsening in October of 2021 when a MRI brain revealed an acute stroke in the left cerebellum with significant intracranial atherosclerosis in bilateral vertebral arteries.   LKW: Symptoms present for 3 weeks with last known well reported by patient to be  10/28 TNK given?: no, patient is outside of the window for thrombolytic therapy. IR Thrombectomy? No, presentation is not concerning for LVO Modified Rankin Scale: 1-No significant post stroke disability and can perform usual duties with stroke symptoms  ROS: A complete ROS was performed and is negative except as noted in the HPI.  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 He has been smoking an average of .5 packs per day. He has never been exposed to tobacco smoke. He has never used smokeless tobacco. He reports current alcohol use. He reports that he does not use drugs.  Medications  Current Facility-Administered Medications:    sodium chloride flush (NS) 0.9 % injection 3 mL, 3 mL, Intravenous, Once, Davonna Belling, MD  Current Outpatient Medications:    atorvastatin (LIPITOR) 20 MG tablet, Take 2 tablets (40 mg total) by mouth at bedtime., Disp: 60 tablet, Rfl: 0   lisinopril-hydrochlorothiazide (PRINZIDE,ZESTORETIC) 10-12.5 MG tablet, Take 1 tablet by mouth daily., Disp: , Rfl:    Multiple Vitamin (MULTIVITAMIN ADULT) TABS, Take 1 tablet by mouth 3 (three) times a week., Disp: , Rfl:    XARELTO 2.5 MG TABS tablet, Take 2.5 mg by mouth 2 (two) times daily., Disp: , Rfl:   Exam: Current vital signs: BP (!) 216/100   Pulse 69   Temp 98.4 F (36.9 C) (Oral)   Resp (!) 25   Wt 71.8 kg   SpO2 100%   BMI 22.71 kg/m  Vital signs in last 24 hours: Temp:  [98.4 F (36.9 C)] 98.4 F (36.9 C) (10/31 1812) Pulse Rate:  [63-69] 69 (10/31 1812) Resp:  [25]  25 (10/31 1812) BP: (135-216)/(81-100) 216/100 (10/31 1812) SpO2:  [100 %] 100 % (10/31 1812) Weight:  [71.8 kg-72.6 kg] 71.8 kg (10/31 1700)  GENERAL: Awake, alert, appears anxious Psych: Has periods of restlessness throughout assessment but remains cooperative throughout examination Head: Normocephalic and atraumatic, without obvious abnormality EENT: Normal conjunctivae, dry mucous membranes, no OP obstruction LUNGS: Normal respiratory effort. Non-labored breathing on room air CV: Regular rate and rhythm on  telemetry ABDOMEN: Soft, non-tender, non-distended Extremities: warm, well perfused, without obvious deformity  NEURO:  Mental Status: Awake, alert, and oriented to person, place, time, and situation. He is able to provide a clear and coherent history of present illness. Speech/Language: speech is at patient's baseline.   Naming, repetition, fluency, and comprehension intact without aphasia  No neglect is noted Cranial Nerves:  II: PERRL. Visual fields full.  III, IV, VI: EOMI without ptosis, nystagmus, or gaze preference.   V: Sensation is intact to light touch and symmetrical to face.  VII: Face is symmetric resting and smiling. VIII: Hearing is intact to voice IX, X: Palate elevation is symmetric. Phonation normal.  XI: Normal sternocleidomastoid and trapezius muscle strength XII: Tongue protrudes midline without fasciculations.   Motor: 5/5 strength is all muscle groups without vertical drift Tone is normal. Bulk is normal.  Sensation: Intact to light touch bilaterally in all four extremities. Coordination: FTN intact bilaterally. HKS intact bilaterally. No pronator drift.  Gait: Deferred  NIHSS: 1a Level of Conscious.: 0 1b LOC Questions: 0 1c LOC Commands: 0 2 Best Gaze: 0 3 Visual: 0 4 Facial Palsy: 0 5a Motor Arm - left: 0 5b Motor Arm - Right: 0 6a Motor Leg - Left: 0 6b Motor Leg - Right: 0 7 Limb Ataxia: 0 8 Sensory: 0 9 Best Language: 0 10 Dysarthria: 0 11 Extinct. and Inatten.: 0 TOTAL: 0  Labs I have reviewed labs in epic and the results pertinent to this consultation are: CBC    Component Value Date/Time   WBC 11.8 (H) 04/19/2021 1745   RBC 4.96 04/19/2021 1745   HGB 15.0 04/19/2021 1747   HCT 44.0 04/19/2021 1747   PLT 225 04/19/2021 1745   MCV 88.7 04/19/2021 1745   MCH 30.4 04/19/2021 1745   MCHC 34.3 04/19/2021 1745   RDW 13.7 04/19/2021 1745   LYMPHSABS 4.5 (H) 04/19/2021 1745   MONOABS 0.7 04/19/2021 1745   EOSABS 0.2 04/19/2021 1745    BASOSABS 0.1 04/19/2021 1745   CMP     Component Value Date/Time   NA 139 04/19/2021 1747   K 3.4 (L) 04/19/2021 1747   CL 105 04/19/2021 1747   CO2 20 (L) 04/13/2021 1642   GLUCOSE 124 (H) 04/19/2021 1747   BUN 25 (H) 04/19/2021 1747   CREATININE 1.40 (H) 04/19/2021 1747   CALCIUM 9.2 04/13/2021 1642   PROT 7.4 04/13/2021 1642   ALBUMIN 4.3 04/13/2021 1642   AST 19 04/13/2021 1642   ALT 18 04/13/2021 1642   ALKPHOS 88 04/13/2021 1642   BILITOT 0.4 04/13/2021 1642   GFRNONAA 56 (L) 04/13/2021 1642   GFRAA 58 (L) 10/06/2015 1408   Lipid Panel     Component Value Date/Time   CHOL 199 04/21/2020 1418   TRIG 621 (HH) 04/21/2020 1418   HDL 25 (L) 04/21/2020 1418   CHOLHDL 8.0 (H) 04/21/2020 1418   LDLCALC 76 04/21/2020 1418   Lab Results  Component Value Date   HGBA1C 6.0 (H) 04/21/2020   Imaging I have reviewed the images  obtained:  CT-scan of the brain 10/31: 1. No acute intracranial process. 2. ASPECTS is 10  CT angio head and neck 10/31:  IMPRESSION: CTA head:  1. 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 2. Bilateral PCAs are small and difficult to follow distally, but appear to be patent proximally with multifocal irregularity/stenosis that is likely severe distally on the left. 3. 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 4. Severe stenosis of the distal right A2 and A3 ACA.   CTA Neck:   1. Severe stenosis of the right vertebral artery origin with poor opacification of the V2 vertebral artery and non opacification of the V3 vertebral artery. Suspected severe stenosis at the dural margin. 2. Severe stenosis of the proximal left vertebral artery. 3. Moderate stenosis of the left common carotid artery origin and proximal right subclavian artery. 4. Bilateral carotid bifurcation atherosclerosis with approximately 40% stenosis of the left proximal ICA. 5. Partially imaged mass in the anterior  mediastinum which measures 4.3 x 2.7 cm, increased in size since 2017 CTA chest. This could potentially represent a slowly enlarging malignancy or abnormal lymph node. Recommend CT chest with contrast or PET-CT for further evaluation. 6. Aortic Atherosclerosis (ICD10-I70.0) and Emphysema (ICD10-J43.9).  Incidental anterior midsternal mass that has increased from before-differential includes tumor versus lymph node.  Further imaging in the form of CT chest recommended.  MRI examination of the brain 10/25: 1. No acute intracranial abnormality. 2. Remote left PICA territory infarct, with a few additional tiny right cerebellar infarcts. 3. Underlying moderate chronic microvascular ischemic disease.  Assessment:  65 y.o. male who presented to the ED for evaluation of dizziness and blurred vision with initial blood pressure reading of 208/92 mmHg.  - Examination revealed patient who appears anxious and restless with complaints of dizziness and blurred vision without focal neurologic deficits identified on formal assessment.  - CT imaging was obtained without acute intracranial process.  Head and neck vessel imaging revealed multifocal severe stenosis, some of which has progressed from his prior MR angio of the head and neck from last year--see full CT angiogram head and neck report above. -Incidental finding of anterior mediastinal mass that has increased in size from last year-tumor versus lymph node-needs further imaging - MRI not repeated with ongoing symptoms for 3 weeks and MRI negative on 04/13/2021. - On chart review, patient has has ongoing complaints of dizziness for approximately one year with increasing dizziness over the past 3 weeks.  - Differential diagnoses may be recrudescence of old stroke symptoms in the setting of severe hypertension versus vertigo versus vertebrobasilar insufficiency.    Impression - Dizziness, ongoing and worsening for the past few weeks-likely due to  posterior circulation atherosclerosis-puzzling etiology-could be due to vertebrobasilar insufficiency but also could be due to hypertensive urgency causing nonspecific dizziness type symptoms.  - Incidental mediastinal mass-work-up per primary team  Recommendations: - IVF with diazepam trial for symptomatic management of dizziness - Blood pressure control with goal SBP  not to go below 120.  Ideal goal would be to keep blood pressure between 120-160. - Ideally needs to be on antiplatelets from a neurological standpoint but is on Xarelto per cardiology - may need to readress this with outpatient neurology and cardiology.  For now continue home anticoagulant. - Do not think there is any merit in neuro interventional radiology consultation as he does not have a single vessel involvement but has generalized/multifocal atherosclerotic disease leading to posterior circulation/vertebrobasilar insufficiency as well  as anterior circulation atherosclerosis as well. - Will need to follow up with outpatient neurologist and cardiologist - No further inpatient neurology work up indicated at this time but may need admission for blood pressure control. -Needs chest imaging for incidental finding of anterior mediastinal mass that has increased in size from prior. - Plan discussed with Dr. Alvino Chapel -Stroke neurology will follow.  Pt seen by NP/Neuro and later by MD. Note/plan to be edited by MD as needed.  Anibal Henderson, AGAC-NP [facility] Pager: [phone]   Attending Neurohospitalist Addendum Patient seen and examined with APP/Resident. Agree with the history and physical as documented above. Agree with the plan as documented, which I helped formulate. I have independently reviewed the chart, obtained history, review of systems and examined the patient.I have personally reviewed pertinent head/neck/spine imaging (CT/MRI). Please feel free to call with any questions.  -- Amie Portland,  MD Neurologist [facility] Pager: [phone]

## 2021-04-19 NOTE — Progress Notes (Signed)
[facility]  PATIENT: Caleb Cardenas DOB: 04-23-1956  REFERRING CLINICIAN: [facility] HISTORY FROM: patient  REASON FOR VISIT: follow up   HISTORICAL  CHIEF COMPLAINT:  Chief Complaint  Patient presents with   Dizziness    RM 7 alone Pt states he has been having dizzy spells for about 3 weeks. Occasional vomiting, headaches and blurred vision. Has been to PCP and cardio, everything was fine.  No onset triggers he is aware     HISTORY OF PRESENT ILLNESS:   UPDATE (04/19/21, VRP): Since last visit, doing poorly. More dizziness, double vision, lightheaded, euphoria, fog spells (1-2 per week; few minutes; intermittent).     PRIOR HPI: (04/21/20; VRP): 65 year old male with cigarette smoking, hypertension, here for evaluation of stroke.  Patient has had intermittent episodes of dizziness since September 2021.  He went to urgent care several times for these symptoms.  Symptoms suddenly worsened on 03/31/2020 when he became very dizzy and had blurred vision.  Patient went to the hospital on 04/01/2020 for evaluation.  MRI of the brain showed acute stroke in the left cerebellum, slightly in the right cerebellum and midline cerebellum.  Significant intracranial atherosclerosis also noted the bilateral vertebral arteries.  Patient was discharged home with follow-up in outpatient neurology.  Since that time symptoms have improved.  He has continued on aspirin and Plavix.  He still smoking half pack cigarettes per day but try to cut down.  He drinks 3 Mountain Dew's per day.  He has not followed up with PCP yet.   REVIEW OF SYSTEMS: Full 14 system review of systems performed and negative with exception of: As per HPI.  ALLERGIES: No Known Allergies  HOME MEDICATIONS: Outpatient Medications Prior to Visit  Medication Sig Dispense Refill   atorvastatin (LIPITOR) 20 MG tablet Take 2 tablets (40 mg total) by mouth at bedtime. 60 tablet 0    lisinopril-hydrochlorothiazide (PRINZIDE,ZESTORETIC) 10-12.5 MG tablet Take 1 tablet by mouth daily.     Multiple Vitamin (MULTIVITAMIN ADULT) TABS Take 1 tablet by mouth 3 (three) times a week.     XARELTO 2.5 MG TABS tablet Take 2.5 mg by mouth 2 (two) times daily.     levofloxacin (LEVAQUIN) 500 MG tablet Take 1 tablet (500 mg total) by mouth daily. 7 tablet 0   No facility-administered medications prior to visit.    PAST MEDICAL HISTORY: Past Medical History:  Diagnosis Date   Blurred vision    Dizziness    Hypertension    Stroke (cerebrum) ([place]) 03/2020    PAST SURGICAL HISTORY: Past Surgical History:  Procedure Laterality Date   KNEE SURGERY     x2  in high school    FAMILY HISTORY: Family History  Problem Relation Age of Onset   Heart attack Mother    Alzheimer's disease Father    Pancreatic cancer Brother     SOCIAL HISTORY: Social History   Socioeconomic History   Marital status: Divorced    Spouse name: Not on file   Number of children: 1   Years of education: Not on file   Highest education level: Some college, no degree  Occupational History    Comment: house flipping, painting  Tobacco Use   Smoking status: Every Day    Packs/day: 0.50    Types: Cigarettes    Passive exposure: Never   Smokeless tobacco: Never  Vaping Use   Vaping Use: Never used  Substance and Sexual Activity   Alcohol use: Yes  Comment: occ   Drug use: No   Sexual activity: Not on file  Other Topics Concern   Not on file  Social History Narrative   Lives alone   Caffeine- sodas 2-3 a day   Social Determinants of Health   Financial Resource Strain: Not on file  Food Insecurity: Not on file  Transportation Needs: Not on file  Physical Activity: Not on file  Stress: Not on file  Social Connections: Not on file  Intimate Partner Violence: Not on file     PHYSICAL EXAM  GENERAL EXAM/CONSTITUTIONAL: Vitals:  Vitals:   04/19/21 1338  BP: 135/81  Pulse: 63   Weight: 160 lb (72.6 kg)  Height: 5\' 10"  (1.778 m)   Body mass index is 22.96 kg/m. Wt Readings from Last 3 Encounters:  04/19/21 160 lb (72.6 kg)  04/13/21 160 lb (72.6 kg)  04/12/21 157 lb 12.8 oz (71.6 kg)   Patient is in no distress; well developed, nourished and groomed; neck is supple  CARDIOVASCULAR: Examination of carotid arteries is normal; no carotid bruits Regular rate and rhythm, no murmurs Examination of peripheral vascular system by observation and palpation is normal  EYES: Ophthalmoscopic exam of optic discs and posterior segments is normal; no papilledema or hemorrhages No results found.  MUSCULOSKELETAL: Gait, strength, tone, movements noted in Neurologic exam below  NEUROLOGIC: MENTAL STATUS:  No flowsheet data found. awake, alert, oriented to person, place and time recent and remote memory intact normal attention and concentration language fluent, comprehension intact, naming intact fund of knowledge appropriate  CRANIAL NERVE:  2nd - no papilledema on fundoscopic exam 2nd, 3rd, 4th, 6th - pupils equal and reactive to light, visual fields full to confrontation, extraocular muscles intact, no nystagmus 5th - facial sensation symmetric 7th - facial strength symmetric 8th - hearing intact 9th - palate elevates symmetrically, uvula midline 11th - shoulder shrug symmetric 12th - tongue protrusion midline  MOTOR:  normal bulk and tone, full strength in the BUE, BLE  SENSORY:  normal and symmetric to light touch, temperature, vibration  COORDINATION:  finger-nose-finger, fine finger movements normal; EXCEPT SLIGHTLY SLOWER ON LEFT SIDE   REFLEXES:  deep tendon reflexes present and symmetric  GAIT/STATION:  narrow based gait     DIAGNOSTIC DATA (LABS, IMAGING, TESTING) - I reviewed patient records, labs, notes, testing and imaging myself where available.  Lab Results  Component Value Date   WBC 11.6 (H) 04/13/2021   HGB 14.7 04/13/2021    HCT 43.0 04/13/2021   MCV 86.5 04/13/2021   PLT 246 04/13/2021      Component Value Date/Time   NA 134 (L) 04/13/2021 1642   K 3.4 (L) 04/13/2021 1642   CL 102 04/13/2021 1642   CO2 20 (L) 04/13/2021 1642   GLUCOSE 95 04/13/2021 1642   BUN 25 (H) 04/13/2021 1642   CREATININE 1.39 (H) 04/13/2021 1642   CALCIUM 9.2 04/13/2021 1642   PROT 7.4 04/13/2021 1642   ALBUMIN 4.3 04/13/2021 1642   AST 19 04/13/2021 1642   ALT 18 04/13/2021 1642   ALKPHOS 88 04/13/2021 1642   BILITOT 0.4 04/13/2021 1642   GFRNONAA 56 (L) 04/13/2021 1642   GFRAA 58 (L) 10/06/2015 1408   Lab Results  Component Value Date   CHOL 199 04/21/2020   HDL 25 (L) 04/21/2020   LDLCALC 76 04/21/2020   TRIG 621 (HH) 04/21/2020   CHOLHDL 8.0 (H) 04/21/2020   Lab Results  Component Value Date   HGBA1C 6.0 (H) 04/21/2020  No results found for: VITAMINB12 No results found for: TSH   04/01/20 MRI brain, MRA head / neck [I reviewed images myself and agree with interpretation. -VRP]  1. Bilateral posterior circulation acute ischemia with a relatively large left PICA territory infarct. No hemorrhage or mass effect. 2. Suspect occlusion or stenosis of the distal left P3 segment.  3. 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 4. Short segment moderate stenosis of the proximal left external carotid artery.  04/13/21 MRI brain 1. No acute intracranial abnormality. 2. Remote left PICA territory infarct, with a few additional tiny right cerebellar infarcts. 3. Underlying moderate chronic microvascular ischemic disease.   ASSESSMENT AND PLAN  65 y.o. year old male here with:   Dx:  1. 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 (HCC)      PLAN:  DIZZINESS / VERTEBROBASILAR INSUFFICIENCY / POSTERIOR CIRCULATION INFARCTS (bilateral cerebellar; left >> right; embolic) - check CTA head / neck - continue xarelto, atorvastatin - continue BP  control (SBP goal 120-140 due to severe posterior circulation atherosclerosis) - daily physical activity / exercise (at least 15-30 minutes) - eat more plants / vegetables - increase social activities, brain stimulation, games, puzzles, hobbies, crafts, arts, music - aim for at least 7-8 hours sleep per night (or more) - avoid smoking and alcohol  No orders of the defined types were placed in this encounter.  Orders Placed This Encounter  Procedures   CT ANGIO HEAD W OR WO CONTRAST   CT ANGIO NECK W OR WO CONTRAST   Return for pending if symptoms worsen or fail to improve, pending test results.  I reviewed images, labs, notes, records myself. I summarized findings and reviewed with patient, for this high risk condition (TIA) requiring high complexity decision making.    Penni Bombard, MD 41/58/3094, 0:76 PM Certified in Neurology, Neurophysiology and Neuroimaging  [facility] [address] [place], [place] [address] [phone]

## 2021-04-19 NOTE — Code Documentation (Signed)
Stroke Response Nurse Documentation Code Documentation  Caleb Cardenas is a 65 y.o. male arriving to [facility] ED via [facility] on 04/19/2021 with past medical hx of recent cerebellar CVA. On Xarelto (rivaroxaban) daily. Code stroke was activated by EMS.   Patient from home where he was LKW at 1230 and now complaining of dizziness and blurred vision . He has had intermittent dizziness over the past few weeks and was diagnosed with a cerebellar CVA.  The dizziness he experienced today became acutely worse while he was driving.  His family called EMS  Stroke team at the bedside on patient arrival. Labs drawn and patient cleared for CT by Dr. Alvino Chapel. Patient to CT with team. NIHSS 0, see documentation for details and code stroke times. Patient complains of dizziness and blurred vision on exam. The following imaging was completed:  CT, CTA head and neck. Patient is not a candidate for IV Thrombolytic due to recent CVA. Patient is not a candidate for IR due to no LVO.   Care/Plan: neuro check and VS q 2hrs.   Bedside handoff with ED RN Burman Nieves.    Raliegh Ip  Stroke Response RN

## 2021-04-19 NOTE — Telephone Encounter (Signed)
[employer] medicare order sent to [facility], they will obtain the auth and reach out to the patient to schedule.  

## 2021-04-19 NOTE — ED Triage Notes (Signed)
Pt arrives via EMS as code stroke, LSN 1615. Pt was driving and had dizziness and vision trouble. Pt reports recent CT and MRI.

## 2021-04-19 NOTE — H&P (Signed)
History and Physical    Caleb Cardenas JQB:[id number] DOB: 1956-06-12 DOA: 04/19/2021  [facility]P: [facility] [place]  Patient coming from: Home.  Chief Complaint: Dizziness.  HPI: Caleb Cardenas is a 65 y.o. male with history of hypertension, prior stroke, hyperlipidemia, tobacco abuse has been experiencing intermittent dizziness with blurring of vision since September 2022.  Had come to the ER on October 25 and had an MRI of the brain which was unremarkable and patient had follow-up with neurologist today and plan is to get CT angiogram of the head and neck as outpatient for concern for vertebrobasilar insufficiency.  On the way back to home patient felt very dizzy and called his brother-in-law who had called EMS and patient was brought to the hospital.  Patient states that he feels dizzy mostly on exertion.  He feels that he gets blurred vision like a cloud.  Sometimes only last for 5 minutes sometimes it comes prolonged.  Today became more prolonged than usual.  Denies any weakness of the extremities.  Denies any difficulty swallowing or speaking.  I recently followed up with cardiologist.  Patient also has an appointment with dentist tomorrow for tooth extraction.   ED Course: In the ER patient appears nonfocal.  Blood pressure was more than 409 systolic.  Patient had CT angiogram of the head and neck which showed severe multifocal stenosis.  Neurologist on-call was consulted.  Plan was to admit and closely observe.  Control blood pressure.  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  EKG shows normal sinus rhythm.  Labs show creatinine 1.5 WBC 11.8 COVID test negative.  Patient was given Valium.  At the time of my exam patient's blood pressure systolic was 735.  Review of Systems: As per HPI, rest all negative.   Past Medical History:  Diagnosis Date   Blurred vision    Dizziness    Hypertension    Stroke (cerebrum) ([place])  03/2020    Past Surgical History:  Procedure Laterality Date   KNEE SURGERY     x2  in high school     reports that he has been smoking cigarettes. He has been smoking an average of .5 packs per day. He has never been exposed to tobacco smoke. He has never used smokeless tobacco. He reports current alcohol use. He reports that he does not use drugs.  No Known Allergies  Family History  Problem Relation Age of Onset   Heart attack Mother    Alzheimer's disease Father    Pancreatic cancer Brother     Prior to Admission medications   Medication Sig Start Date End Date Taking? Authorizing Provider  atorvastatin (LIPITOR) 20 MG tablet Take 2 tablets (40 mg total) by mouth at bedtime. 04/14/21 05/14/21  Wyvonnia Dusky, MD  lisinopril-hydrochlorothiazide (PRINZIDE,ZESTORETIC) 10-12.5 MG tablet Take 1 tablet by mouth daily.    [provider]  Multiple Vitamin (MULTIVITAMIN ADULT) TABS Take 1 tablet by mouth 3 (three) times a week.    [provider]  XARELTO 2.5 MG TABS tablet Take 2.5 mg by mouth 2 (two) times daily. 12/22/20   [provider]    Physical Exam: Constitutional: Moderately built and nourished. Vitals:   04/19/21 1830 04/19/21 1845 04/19/21 1900 04/19/21 1930  BP: (!) 185/85 (!) 185/85 (!) 126/106 131/68  Pulse: 63 65 66 (!) 59  Resp: (!) 21 (!) 22 (!) 25 (!) 22  Temp:  TempSrc:      SpO2: 98% 97% 98% 96%  Weight:       Eyes: Anicteric no pallor. ENMT: No discharge from the ears eyes nose and mouth. Neck: No mass felt.  No neck rigidity. Respiratory: No rhonchi or crepitations. Cardiovascular: S1-S2 heard. Abdomen: Soft nontender bowel sounds present. Musculoskeletal: No edema. Skin: No rash. Neurologic: Alert awake oriented to time place and person.  Moves all extremities 5 x 5.  No facial asymmetry tongue is midline.  Pupils are equal and reacting to light. Psychiatric: Appears normal.  Normal affect.   Labs on Admission: I  have personally reviewed following labs and imaging studies  CBC: Recent Labs  Lab 04/13/21 1642 04/19/21 1745 04/19/21 1747  WBC 11.6* 11.8*  --   NEUTROABS 6.5 6.2  --   HGB 14.7 15.1 15.0  HCT 43.0 44.0 44.0  MCV 86.5 88.7  --   PLT 246 225  --    Basic Metabolic Panel: Recent Labs  Lab 04/13/21 1642 04/19/21 1745 04/19/21 1747  NA 134* 137 139  K 3.4* 3.5 3.4*  CL 102 106 105  CO2 20* 21*  --   GLUCOSE 95 126* 124*  BUN 25* 24* 25*  CREATININE 1.39* 1.50* 1.40*  CALCIUM 9.2 8.8*  --    GFR: Estimated Creatinine Clearance: 52.7 mL/min (A) (by C-G formula based on SCr of 1.4 mg/dL (H)). Liver Function Tests: Recent Labs  Lab 04/13/21 1642 04/19/21 1745  AST 19 23  ALT 18 21  ALKPHOS 88 84  BILITOT 0.4 0.9  PROT 7.4 6.8  ALBUMIN 4.3 3.8   No results for input(s): LIPASE, AMYLASE in the last 168 hours. No results for input(s): AMMONIA in the last 168 hours. Coagulation Profile: Recent Labs  Lab 04/19/21 1745  INR 1.0   Cardiac Enzymes: No results for input(s): CKTOTAL, CKMB, CKMBINDEX, TROPONINI in the last 168 hours. BNP (last 3 results) No results for input(s): PROBNP in the last 8760 hours. HbA1C: No results for input(s): HGBA1C in the last 72 hours. CBG: Recent Labs  Lab 04/13/21 1625 04/19/21 1742  GLUCAP 109* 126*   Lipid Profile: No results for input(s): CHOL, HDL, LDLCALC, TRIG, CHOLHDL, LDLDIRECT in the last 72 hours. Thyroid Function Tests: No results for input(s): TSH, T4TOTAL, FREET4, T3FREE, THYROIDAB in the last 72 hours. Anemia Panel: No results for input(s): VITAMINB12, FOLATE, FERRITIN, TIBC, IRON, RETICCTPCT in the last 72 hours. Urine analysis:    Component Value Date/Time   COLORURINE COLORLESS (A) 04/13/2021 1716   APPEARANCEUR CLEAR 04/13/2021 1716   LABSPEC <1.005 (L) 04/13/2021 1716   PHURINE 7.0 04/13/2021 1716   GLUCOSEU NEGATIVE 04/13/2021 [place] 04/13/2021 [place] 04/13/2021  [place] 04/13/2021 1716   PROTEINUR NEGATIVE 04/13/2021 1716   NITRITE NEGATIVE 04/13/2021 [place] 04/13/2021 1716   Sepsis Labs: @LABRCNTIP (procalcitonin:4,lacticidven:4) ) Recent Results (from the past 240 hour(s))  Resp Panel by RT-PCR (Flu A&B, Covid) Nasopharyngeal Swab     Status: None   Collection Time: 04/13/21  5:16 PM   Specimen: Nasopharyngeal Swab; Nasopharyngeal(NP) swabs in vial transport medium  Result Value Ref Range Status   SARS Coronavirus 2 by RT PCR NEGATIVE NEGATIVE Final    Comment: (NOTE) SARS-CoV-2 target nucleic acids are NOT DETECTED.  The SARS-CoV-2 RNA is generally detectable in upper respiratory specimens during the acute phase of infection. The lowest concentration of SARS-CoV-2 viral copies this assay can detect  is 138 copies/mL. A negative result does not preclude SARS-Cov-2 infection and should not be used as the sole basis for treatment or other patient management decisions. A negative result may occur with  improper specimen collection/handling, submission of specimen other than nasopharyngeal swab, presence of viral mutation(s) within the areas targeted by this assay, and inadequate number of viral copies(<138 copies/mL). A negative result must be combined with clinical observations, patient history, and epidemiological information. The expected result is Negative.  Fact Sheet for Patients:  EntrepreneurPulse.com.au  Fact Sheet for Healthcare Providers:  IncredibleEmployment.be  This test is no t yet approved or cleared by the [place] FDA and  has been authorized for detection and/or diagnosis of SARS-CoV-2 by FDA under an Emergency Use Authorization (EUA). This EUA will remain  in effect (meaning this test can be used) for the duration of the COVID-19 declaration under Section 564(b)(1) of the Act, 21 U.S.C.section 360bbb-3(b)(1), unless the authorization is  terminated  or revoked sooner.       Influenza A by PCR NEGATIVE NEGATIVE Final   Influenza B by PCR NEGATIVE NEGATIVE Final    Comment: (NOTE) The Xpert Xpress SARS-CoV-2/FLU/RSV plus assay is intended as an aid in the diagnosis of influenza from Nasopharyngeal swab specimens and should not be used as a sole basis for treatment. Nasal washings and aspirates are unacceptable for Xpert Xpress SARS-CoV-2/FLU/RSV testing.  Fact Sheet for Patients: EntrepreneurPulse.com.au  Fact Sheet for Healthcare Providers: IncredibleEmployment.be  This test is not yet approved or cleared by the [place] FDA and has been authorized for detection and/or diagnosis of SARS-CoV-2 by FDA under an Emergency Use Authorization (EUA). This EUA will remain in effect (meaning this test can be used) for the duration of the COVID-19 declaration under Section 564(b)(1) of the Act, 21 U.S.C. section 360bbb-3(b)(1), unless the authorization is terminated or revoked.  Performed at [facility], [address] [place], [place] [address]   Resp Panel by RT-PCR (Flu A&B, Covid) Nasopharyngeal Swab     Status: None   Collection Time: 04/19/21  7:51 PM   Specimen: Nasopharyngeal Swab; Nasopharyngeal(NP) swabs in vial transport medium  Result Value Ref Range Status   SARS Coronavirus 2 by RT PCR NEGATIVE NEGATIVE Final    Comment: (NOTE) SARS-CoV-2 target nucleic acids are NOT DETECTED.  The SARS-CoV-2 RNA is generally detectable in upper respiratory specimens during the acute phase of infection. The lowest concentration of SARS-CoV-2 viral copies this assay can detect is 138 copies/mL. A negative result does not preclude SARS-Cov-2 infection and should not be used as the sole basis for treatment or other patient management decisions. A negative result may occur with  improper specimen collection/handling, submission of specimen other than  nasopharyngeal swab, presence of viral mutation(s) within the areas targeted by this assay, and inadequate number of viral copies(<138 copies/mL). A negative result must be combined with clinical observations, patient history, and epidemiological information. The expected result is Negative.  Fact Sheet for Patients:  EntrepreneurPulse.com.au  Fact Sheet for Healthcare Providers:  IncredibleEmployment.be  This test is no t yet approved or cleared by the [place] FDA and  has been authorized for detection and/or diagnosis of SARS-CoV-2 by FDA under an Emergency Use Authorization (EUA). This EUA will remain  in effect (meaning this test can be used) for the duration of the COVID-19 declaration under Section 564(b)(1) of the Act, 21 U.S.C.section 360bbb-3(b)(1), unless the authorization is terminated  or revoked sooner.  Influenza A by PCR NEGATIVE NEGATIVE Final   Influenza B by PCR NEGATIVE NEGATIVE Final    Comment: (NOTE) The Xpert Xpress SARS-CoV-2/FLU/RSV plus assay is intended as an aid in the diagnosis of influenza from Nasopharyngeal swab specimens and should not be used as a sole basis for treatment. Nasal washings and aspirates are unacceptable for Xpert Xpress SARS-CoV-2/FLU/RSV testing.  Fact Sheet for Patients: EntrepreneurPulse.com.au  Fact Sheet for Healthcare Providers: IncredibleEmployment.be  This test is not yet approved or cleared by the [place] FDA and has been authorized for detection and/or diagnosis of SARS-CoV-2 by FDA under an Emergency Use Authorization (EUA). This EUA will remain in effect (meaning this test can be used) for the duration of the COVID-19 declaration under Section 564(b)(1) of the Act, 21 U.S.C. section 360bbb-3(b)(1), unless the authorization is terminated or revoked.  Performed at [facility], [place] [address] [place], [place] [address]       Radiological Exams on Admission: CT HEAD CODE STROKE WO CONTRAST  Result Date: 04/19/2021 CLINICAL DATA:  Code stroke.  Speech and vision changes EXAM: CT HEAD WITHOUT CONTRAST TECHNIQUE: Contiguous axial images were obtained from the base of the skull through the vertex without intravenous contrast. COMPARISON:  04/13/2021. FINDINGS: Brain: No evidence of acute infarction, hemorrhage, cerebral edema, mass, mass effect, or midline shift. Ventricles and sulci are normal for age. No extra-axial fluid collection. Vascular: No hyperdense vessel or unexpected calcification. Skull: Normal. Negative for fracture or focal lesion. Sinuses/Orbits: Mild mucosal thickening in the ethmoid air cells. The orbits are unremarkable. Other: The mastoid air cells are well aerated. ASPECTS [facility] Stroke Program Early CT Score) - Ganglionic level infarction (caudate, lentiform nuclei, internal capsule, insula, M1-M3 cortex): 7 - Supraganglionic infarction (M4-M6 cortex): 3 Total score (0-10 with 10 being normal): 10 IMPRESSION: 1. No acute intracranial process. 2. ASPECTS is 10 Code stroke imaging results were communicated on 04/19/2021 at 5:54 pm to provider Dr. Rory Percy via secure text paging. Electronically Signed   By: Merilyn Baba M.D.   On: 04/19/2021 17:54   CT ANGIO HEAD NECK W WO CM (CODE STROKE)  Result Date: 04/19/2021 CLINICAL DATA:  Neuro deficit, acute, stroke suspected EXAM: CT ANGIOGRAPHY HEAD AND NECK TECHNIQUE: Multidetector CT imaging of the head and neck was performed using the standard protocol during bolus administration of intravenous contrast. Multiplanar CT image reconstructions and MIPs were obtained to evaluate the vascular anatomy. Carotid stenosis measurements (when applicable) are obtained utilizing NASCET criteria, using the distal internal carotid diameter as the denominator. CONTRAST:  60mL OMNIPAQUE IOHEXOL 350 MG/ML SOLN COMPARISON:  MRA April 01, 2020. FINDINGS: CTA NECK FINDINGS  Aortic arch: Atherosclerosis aorta great vessel origins, which are patent. Moderate stenosis of the left common carotid artery origin and proximal right subclavian artery. Right carotid system: Calcific and noncalcific atherosclerosis of the carotid bifurcation and proximal ICA without greater than 50% stenosis relative to the distal vessel. Left carotid system: Moderate stenosis of the common carotid artery origin. Bulky calcific atherosclerosis at the carotid bifurcation with approximately 40% stenosis of the proximal ICA Vertebral arteries: Severe stenosis of the right vertebral artery origin with poor opacification of the V2 vertebral artery and occlusion of the V3 vertebral artery. Severe stenosis of the proximal left vertebral artery. Remainder of the left vertebral artery in the neck is patent. Skeleton: Degenerative change of the cervical spine multilevel facet arthropathy. Other neck: No evidence of acute abnormality Upper chest: Partially imaged mass in the anterior mediastinum which  measures 4.3 x 2.7 cm, increased in size since 2017 CTA chest. Bronchial wall thickening and emphysema (paraseptal and centrallobular). Review of the MIP images confirms the above findings CTA HEAD FINDINGS Anterior circulation: Calcific atherosclerosis of bilateral intracranial ICAs with resulting mild multifocal stenosis. Bilateral M1 MCAs are patent. 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 Left M2 MCA branches are patent without proximal high-grade stenosis. Bilateral ACAs are patent. Severe stenosis of the distal right A2 and A3 ACA. Posterior circulation: Non opacified V3 vertebral artery with suspected severe stenosis at the dural margin when correalting with prior MRA. Faint opacification of the intradural right vertebral artery, likely from and retrograde flow. 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 Opacification of more distal intradural vertebral artery.  Basilar artery is patent with mild irregularity/narrowing. Bilateral posterior cerebral arteries are small and difficult to follow distally, but appear to be patent proximally with multifocal stenoses there is likely severe distally on the left. Venous sinuses: As permitted by contrast timing, patent. Review of the MIP images confirms the above findings IMPRESSION: CTA head: 1. 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 2. Bilateral PCAs are small and difficult to follow distally, but appear to be patent proximally with multifocal irregularity/stenosis that is likely severe distally on the left. 3. 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 4. Severe stenosis of the distal right A2 and A3 ACA. CTA Neck: 1. Severe stenosis of the right vertebral artery origin with poor opacification of the V2 vertebral artery and non opacification of the V3 vertebral artery. Suspected severe stenosis at the dural margin. 2. Severe stenosis of the proximal left vertebral artery. 3. Moderate stenosis of the left common carotid artery origin and proximal right subclavian artery. 4. Bilateral carotid bifurcation atherosclerosis with approximately 40% stenosis of the left proximal ICA. 5. Partially imaged mass in the anterior mediastinum which measures 4.3 x 2.7 cm, increased in size since 2017 CTA chest. This could potentially represent a slowly enlarging malignancy or abnormal lymph node. Recommend CT chest with contrast or PET-CT for further evaluation. 6. Aortic Atherosclerosis (ICD10-I70.0) and Emphysema (ICD10-J43.9). Findings discussed with Dr. Karin Lieu via telephone at 6:40 p.m. Electronically Signed   By: Margaretha Sheffield M.D.   On: 04/19/2021 18:52    EKG: Independently reviewed.  Normal sinus rhythm with nonspecific changes.  Assessment/Plan Principal Problem:   Hypertensive urgency Active Problems:   Dizziness   CKD (chronic kidney disease) stage 3, GFR 30-59 ml/min  (HCC)    Dizziness with blurriness with CT angiogram of the head and neck showing multifocal severe stenosis.  Appreciate neurology consult.  At this time differentials include vertebrobasilar insufficiency neurologist also think that hypertensive urgency may be contributing.  Neurology has recommended IV fluids and trial of diazepam.  We will closely monitor. Hypertensive urgency we will continue patient's home dose of lisinopril.  Hydrochlorothiazide on hold due to patient receiving fluids.  We will keep patient on as needed IV hydralazine and follow blood pressure trends. 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  We need dedicated CT scan of the chest which we will be ordering tomorrow after hydration since patient already received contrast.  Patient is aware of this diagnosis. Prior stroke on Lipitor.  Patient also takes Xarelto for peripheral vascular disease. History of peripheral vascular disease on Xarelto.  I discussed with pharmacy about dosing it.  Xarelto is on hold for last 2 days  because of planned tooth extraction tomorrow which has been postponed now review to patient's hospitalization. Chronic kidney disease stage II creatinine appears to be at baseline. Tobacco abuse advised about quitting.   DVT prophylaxis: Xarelto. Code Status: Full code. Family Communication: Patient's family at the bedside. Disposition Plan: Home. Consults called: Neurology. Admission status: Observation.   Rise Patience MD [facility] Pager [phone].  If 7PM-7AM, please contact night-coverage [email or web address] Password [email or web address]  04/19/2021, 9:10 PM

## 2021-04-20 ENCOUNTER — Observation Stay (HOSPITAL_BASED_OUTPATIENT_CLINIC_OR_DEPARTMENT_OTHER): Payer: Medicare HMO

## 2021-04-20 ENCOUNTER — Observation Stay (HOSPITAL_COMMUNITY): Payer: Medicare HMO

## 2021-04-20 DIAGNOSIS — G450 Vertebro-basilar artery syndrome: Secondary | ICD-10-CM | POA: Insufficient documentation

## 2021-04-20 DIAGNOSIS — I160 Hypertensive urgency: Secondary | ICD-10-CM | POA: Diagnosis not present

## 2021-04-20 DIAGNOSIS — I350 Nonrheumatic aortic (valve) stenosis: Secondary | ICD-10-CM

## 2021-04-20 DIAGNOSIS — N1831 Chronic kidney disease, stage 3a: Secondary | ICD-10-CM

## 2021-04-20 DIAGNOSIS — R42 Dizziness and giddiness: Secondary | ICD-10-CM | POA: Diagnosis not present

## 2021-04-20 DIAGNOSIS — J9859 Other diseases of mediastinum, not elsewhere classified: Secondary | ICD-10-CM | POA: Diagnosis not present

## 2021-04-20 LAB — BASIC METABOLIC PANEL
Anion gap: 6 (ref 5–15)
BUN: 24 mg/dL — ABNORMAL HIGH (ref 8–23)
CO2: 24 mmol/L (ref 22–32)
Calcium: 8.4 mg/dL — ABNORMAL LOW (ref 8.9–10.3)
Chloride: 107 mmol/L (ref 98–111)
Creatinine, Ser: 1.48 mg/dL — ABNORMAL HIGH (ref 0.61–1.24)
GFR, Estimated: 52 mL/min — ABNORMAL LOW (ref >60)
Glucose, Bld: 101 mg/dL — ABNORMAL HIGH (ref 70–99)
Potassium: 4.5 mmol/L (ref 3.5–5.1)
Sodium: 137 mmol/L (ref 135–145)

## 2021-04-20 LAB — ECHOCARDIOGRAM COMPLETE
AR max vel: 1.95 cm2
AV Area VTI: 1.84 cm2
AV Area mean vel: 1.79 cm2
AV Mean grad: 5 mmHg
AV Peak grad: 8.9 mmHg
Ao pk vel: 1.49 m/s
Area-P 1/2: 3.17 cm2
Height: 70 in
S' Lateral: 3.3 cm
Weight: 2532.64 oz

## 2021-04-20 LAB — CBC
HCT: 44.8 % (ref 39.0–52.0)
Hemoglobin: 15.2 g/dL (ref 13.0–17.0)
MCH: 30.2 pg (ref 26.0–34.0)
MCHC: 33.9 g/dL (ref 30.0–36.0)
MCV: 89.1 fL (ref 80.0–100.0)
Platelets: 215 10*3/uL (ref 150–400)
RBC: 5.03 MIL/uL (ref 4.22–5.81)
RDW: 13.8 % (ref 11.5–15.5)
WBC: 10 10*3/uL (ref 4.0–10.5)
nRBC: 0 % (ref 0.0–0.2)

## 2021-04-20 LAB — HIV ANTIBODY (ROUTINE TESTING W REFLEX): HIV Screen 4th Generation wRfx: NONREACTIVE

## 2021-04-20 IMAGING — MR MR HEAD W/O CM
9 of 10 series · 35 of 48 positions shown · non-contrast
Comparison: [DATE]

CLINICAL DATA: Stroke, follow up

EXAM:
MRI HEAD WITHOUT CONTRAST
TECHNIQUE: Multiplanar, multiecho pulse sequences of the brain and surrounding
structures were obtained without intravenous contrast.

[Series 3: DWI · axial · 3.0mm · 1.09mm/px · z∈[-38,+100]mm · 8 of 94 slices shown (1 of 4)]
[im 1/94]
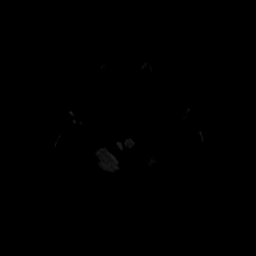
[im 11/94]
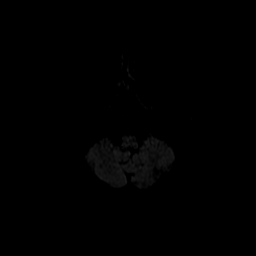
[im 32/94]
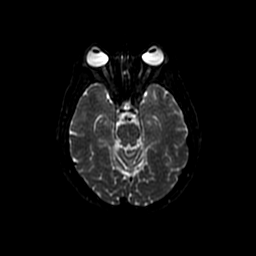
[im 42/94]
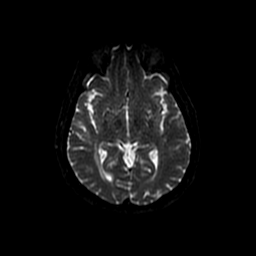
[im 52/94]
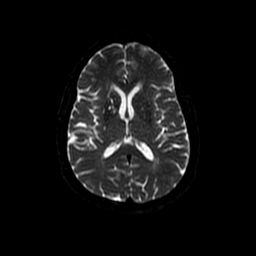
[im 63/94]
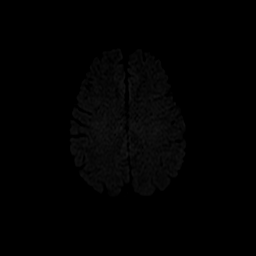
[im 83/94]
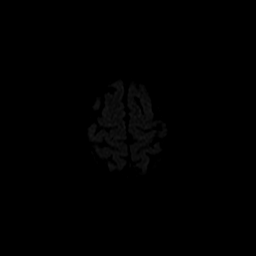
[im 94/94]
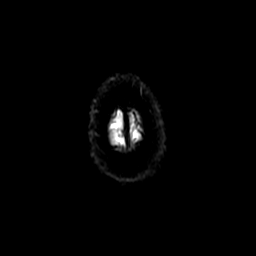

[Series 4: DWI · coronal · 5.0mm · 1.09mm/px · 7 of 66 slices shown (2 of 4)]
[im 1/66]
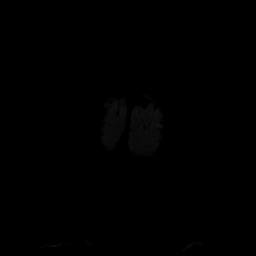
[im 11/66]
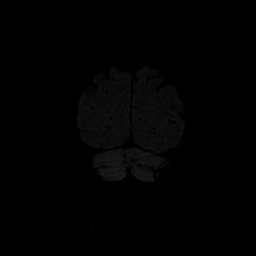
[im 22/66]
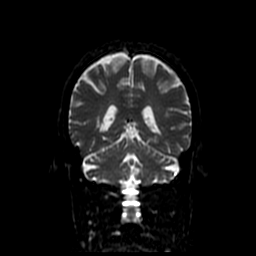
[im 33/66]
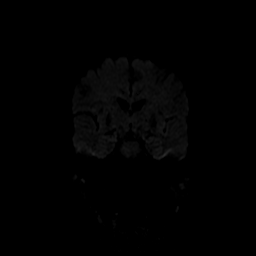
[im 44/66]
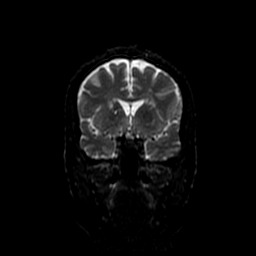
[im 55/66]
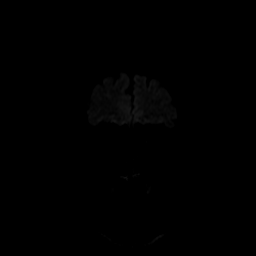
[im 66/66]
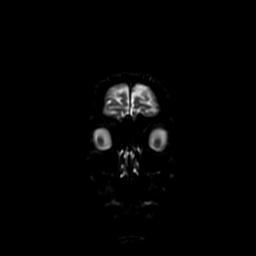

[Series 5: T1 · sagittal · 5.0mm · 0.47mm/px · 2 of 24 slices shown (1 of 2)]
[im 1/24]
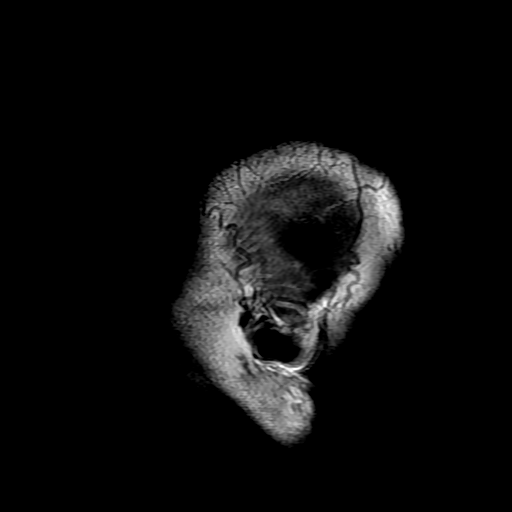
[im 24/24]
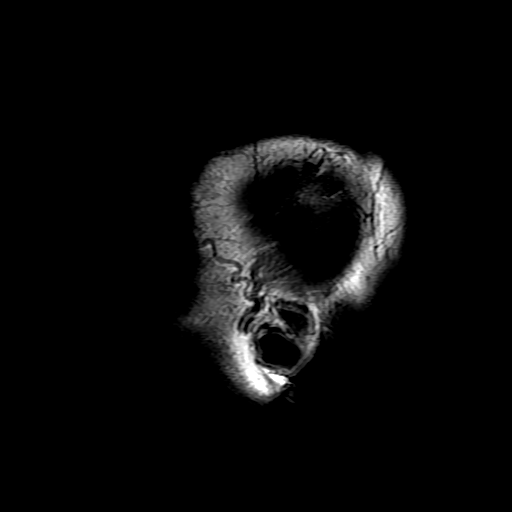

[Series 6: T2 · axial · 5.0mm · 0.45mm/px · z∈[-39,+104]mm · 3 of 25 slices shown (1 of 2)]
[im 1/25]
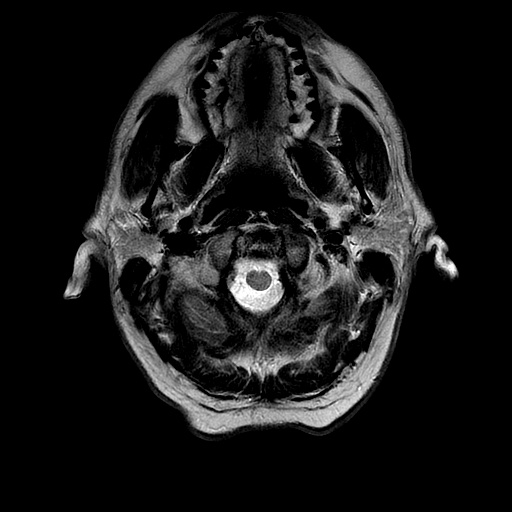
[im 13/25]
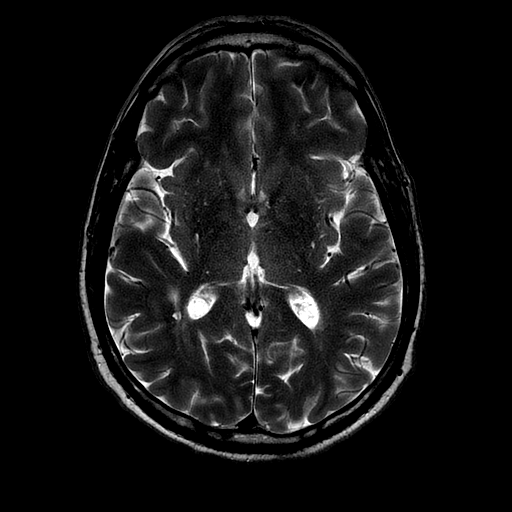
[im 25/25]
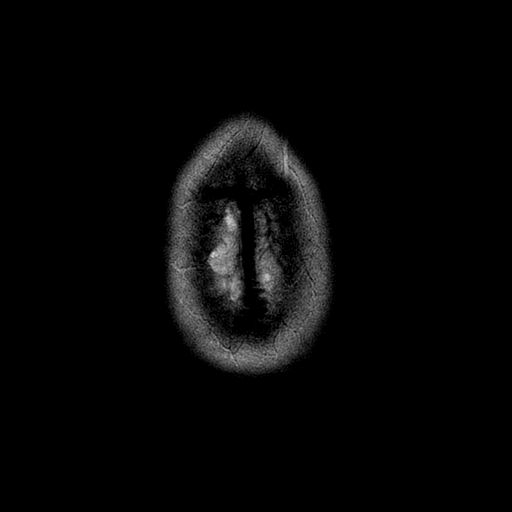

[Series 7: FLAIR · axial · 3.0mm · 0.43mm/px · z∈[-40,+104]mm · 3 of 25 slices shown]
[im 1/25]
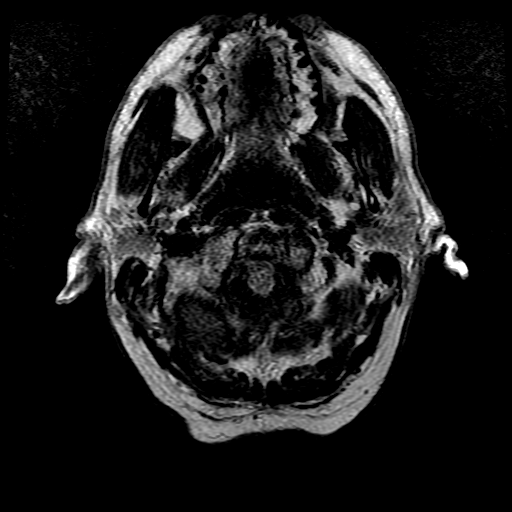
[im 13/25]
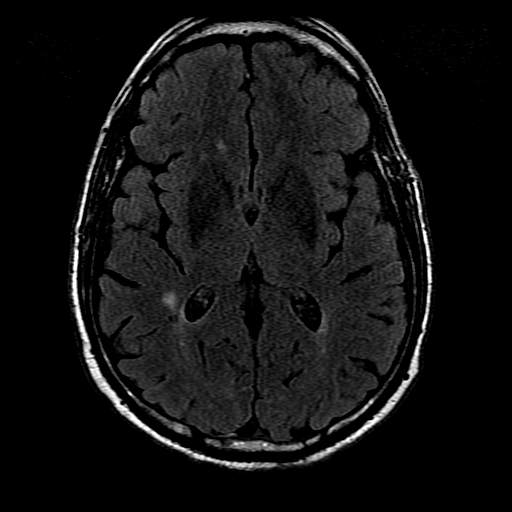
[im 25/25]
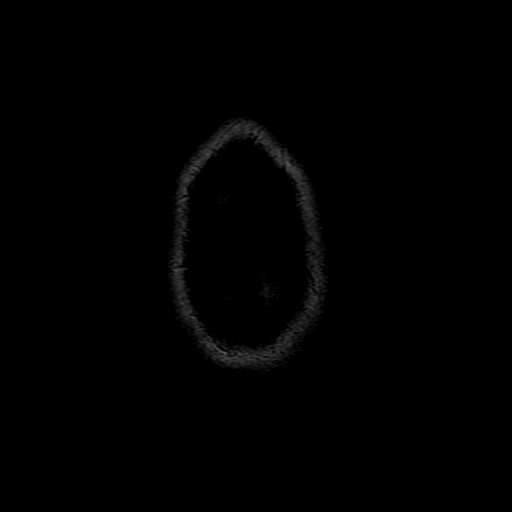

[Series 9: T1 · axial · 3.0mm · 0.47mm/px · 1 of 100 slices shown (2 of 2)]
[im 1/100]
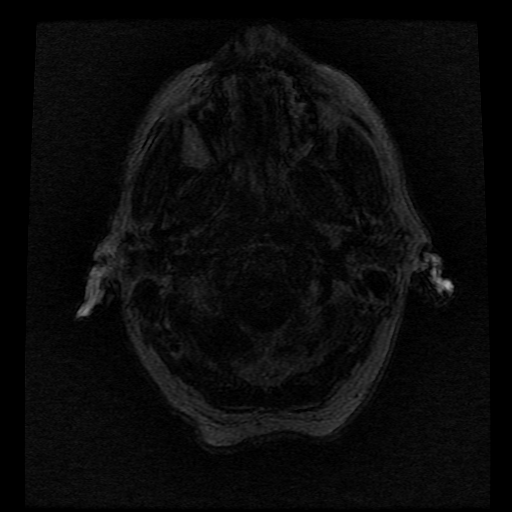

[Series 10: T2 · coronal · 5.0mm · 0.39mm/px · 3 of 27 slices shown (2 of 2)]
[im 1/27]
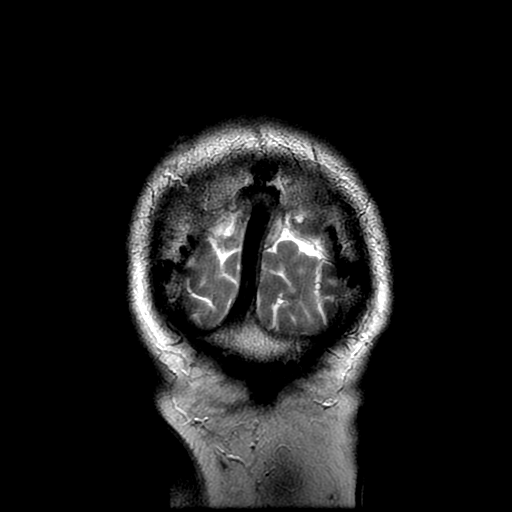
[im 14/27]
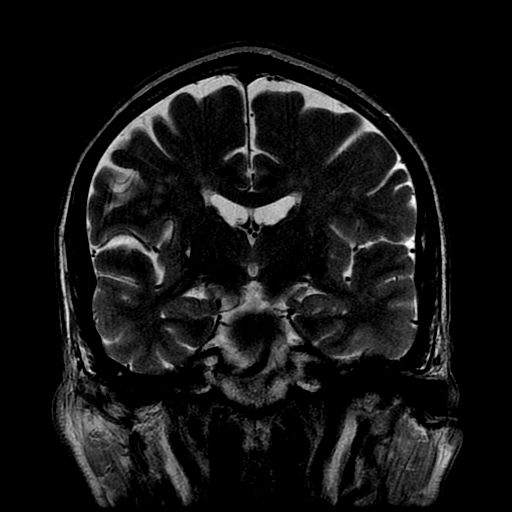
[im 27/27]
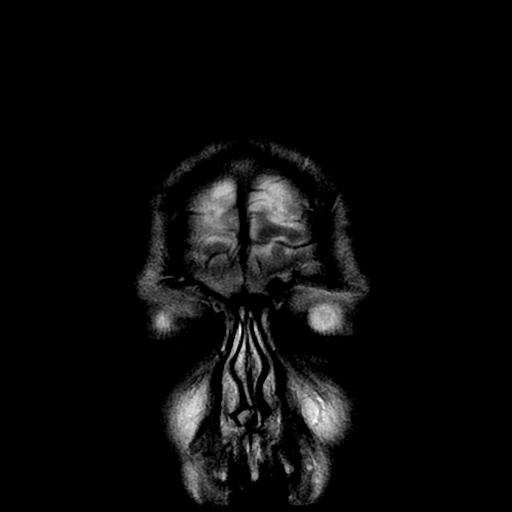

[Series 300: DWI · axial · 3.0mm · 1.09mm/px · z∈[-38,+100]mm · 5 of 47 slices shown (3 of 4)]
[im 1/47]
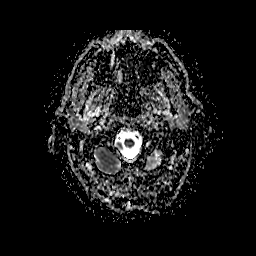
[im 12/47]
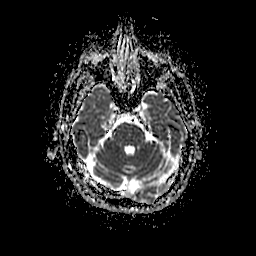
[im 24/47]
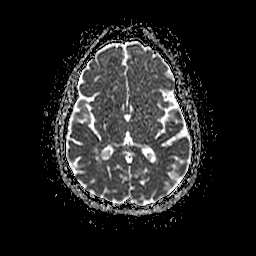
[im 35/47]
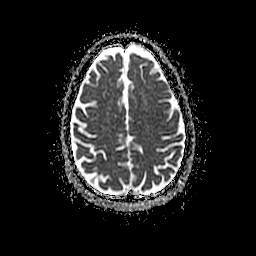
[im 47/47]
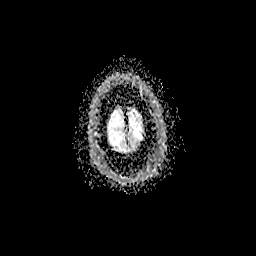

[Series 400: DWI · coronal · 5.0mm · 1.09mm/px · 3 of 33 slices shown (4 of 4)]
[im 1/33]
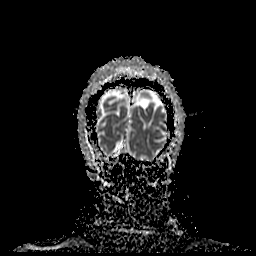
[im 17/33]
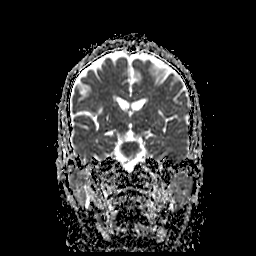
[im 33/33]
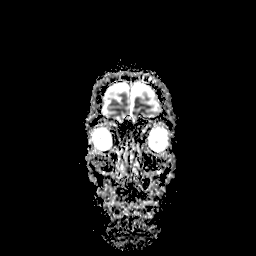

[35 of 48 positions shown; findings below may reference images not displayed]

FINDINGS: Brain: There is no acute infarction or intracranial hemorrhage.
There is no intracranial mass, mass effect, or edema. There is no
hydrocephalus or extra-axial fluid collection. Patchy and confluent
T2 hyperintensity in the supratentorial white matter is nonspecific
but probably reflects stable chronic microvascular ischemic changes.
There are small chronic infarcts of the central white matter and
basal ganglia as well as the left greater than right cerebellum.
Ventricles and sulci are normal in size and configuration.

Vascular: Major vessel flow voids at the skull base are preserved.

Skull and upper cervical spine: Normal marrow signal is preserved.

Sinuses/Orbits: Paranasal sinuses are aerated. Orbits are
unremarkable.

Other: Sella is unremarkable.  Mastoid air cells are clear.
IMPRESSION: No evidence of recent infarction, hemorrhage, or mass. No
significant change since recent prior study.

Chronic microvascular ischemic changes and few chronic small vessel
infarcts.

## 2021-04-20 MED ORDER — ASPIRIN 325 MG PO TABS: 325.0000 mg | ORAL_TABLET | Freq: Every day | ORAL | 2 refills | Status: DC

## 2021-04-20 MED ORDER — ATORVASTATIN CALCIUM 40 MG PO TABS: 80.0000 mg | ORAL_TABLET | Freq: Every day | ORAL | Status: DC

## 2021-04-20 MED ORDER — CLOPIDOGREL BISULFATE 75 MG PO TABS
75.0000 mg | ORAL_TABLET | Freq: Every day | ORAL | 1 refills | Status: DC
Start: 2021-04-21 — End: 2021-05-25

## 2021-04-20 MED ORDER — ATORVASTATIN CALCIUM 80 MG PO TABS: 80.0000 mg | ORAL_TABLET | Freq: Every day | ORAL | 1 refills | Status: DC

## 2021-04-20 MED ORDER — CLOPIDOGREL BISULFATE 75 MG PO TABS
75.0000 mg | ORAL_TABLET | Freq: Every day | ORAL | Status: DC
  Administered 2021-04-20: 75 mg via ORAL
  Filled 2021-04-20: qty 1

## 2021-04-20 MED ORDER — LISINOPRIL 10 MG PO TABS
10.0000 mg | ORAL_TABLET | Freq: Every day | ORAL | 1 refills | Status: DC
Start: 2021-04-20 — End: 2022-06-02

## 2021-04-20 MED ORDER — ASPIRIN 325 MG PO TABS
325.0000 mg | ORAL_TABLET | Freq: Every day | ORAL | Status: DC
  Administered 2021-04-20: 325 mg via ORAL
  Filled 2021-04-20: qty 1

## 2021-04-20 NOTE — Progress Notes (Addendum)
STROKE TEAM PROGRESS NOTE   INTERVAL HISTORY No family at the bedside. Patient lying on the stretcher in NAD. 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 We discussed the option of discontinuing the Xarelto and starting ASA 325mg  and Plavic 75mg  daily for 3 months and he is amendable to this suggestion. Also discussed getting orthostatic vitals  while he is here. He is a current 1/2 ppd smoker and I advised him to start cutting back and quit smoking. Offered him a nicotine patch which he refused at [facility] time   Vitals:   04/20/21 0728 04/20/21 0800 04/20/21 0935 04/20/21 1230  BP: (!) 149/92 (!) 116/99 (!) 147/77 (!) 149/69  Pulse: 71 73 63 60  Resp: 18 16 17 16   Temp:      TempSrc:      SpO2: 94% 94% 100% 99%  Weight:       CBC:  Recent Labs  Lab 04/13/21 1642 04/19/21 1745 04/19/21 1747 04/20/21 0432  WBC 11.6* 11.8*  --  10.0  NEUTROABS 6.5 6.2  --   --   HGB 14.7 15.1 15.0 15.2  HCT 43.0 44.0 44.0 44.8  MCV 86.5 88.7  --  89.1  PLT 246 225  --  174   Basic Metabolic Panel:  Recent Labs  Lab 04/19/21 1745 04/19/21 1747 04/20/21 0432  NA 137 139 137  K 3.5 3.4* 4.5  CL 106 105 107  CO2 21*  --  24  GLUCOSE 126* 124* 101*  BUN 24* 25* 24*  CREATININE 1.50* 1.40* 1.48*  CALCIUM 8.8*  --  8.4*   Lipid Panel: No results for input(s): CHOL, TRIG, HDL, CHOLHDL, VLDL, LDLCALC in the last 168 hours. HgbA1c: No results for input(s): HGBA1C in the last 168 hours. Urine Drug Screen:  Recent Labs  Lab 04/13/21 1716  LABOPIA NONE DETECTED  COCAINSCRNUR NONE DETECTED  LABBENZ NONE DETECTED  AMPHETMU NONE DETECTED  THCU NONE DETECTED  LABBARB NONE DETECTED    Alcohol Level  Recent Labs  Lab 04/13/21 [place] <10    IMAGING past 24 hours MR BRAIN WO CONTRAST  Result Date: 04/20/2021 CLINICAL DATA:  Stroke, follow up EXAM:  MRI HEAD WITHOUT CONTRAST TECHNIQUE: Multiplanar, multiecho pulse sequences of the brain and surrounding structures were obtained without intravenous contrast. COMPARISON:  04/13/2021 FINDINGS: Brain: There is no acute infarction or intracranial hemorrhage. There is no intracranial mass, mass effect, or edema. There is no hydrocephalus or extra-axial fluid collection. Patchy and confluent T2 hyperintensity in the supratentorial white matter is nonspecific but probably reflects stable chronic microvascular ischemic changes. There are small chronic infarcts of the central white matter and basal ganglia as well as the left greater than right cerebellum. Ventricles and sulci are normal in size and configuration. Vascular: Major vessel flow voids at the skull base are preserved. Skull and upper cervical spine: Normal marrow signal is preserved. Sinuses/Orbits: Paranasal sinuses are aerated. Orbits are unremarkable. Other: Sella is unremarkable.  Mastoid air cells are clear. IMPRESSION: No evidence of recent infarction, hemorrhage, or mass. No significant change since recent prior study. Chronic microvascular ischemic changes and few chronic small vessel infarcts. Electronically Signed   By: Macy Mis M.D.   On: 04/20/2021 09:16   CT HEAD CODE STROKE WO CONTRAST  Result Date: 04/19/2021 CLINICAL DATA:  Code stroke.  Speech and vision changes  EXAM: CT HEAD WITHOUT CONTRAST TECHNIQUE: Contiguous axial images were obtained from the base of the skull through the vertex without intravenous contrast. COMPARISON:  04/13/2021. FINDINGS: Brain: No evidence of acute infarction, hemorrhage, cerebral edema, mass, mass effect, or midline shift. Ventricles and sulci are normal for age. No extra-axial fluid collection. Vascular: No hyperdense vessel or unexpected calcification. Skull: Normal. Negative for fracture or focal lesion. Sinuses/Orbits: Mild mucosal thickening in the ethmoid air cells. The orbits are unremarkable.  Other: The mastoid air cells are well aerated. ASPECTS [facility] Stroke Program Early CT Score) - Ganglionic level infarction (caudate, lentiform nuclei, internal capsule, insula, M1-M3 cortex): 7 - Supraganglionic infarction (M4-M6 cortex): 3 Total score (0-10 with 10 being normal): 10 IMPRESSION: 1. No acute intracranial process. 2. ASPECTS is 10 Code stroke imaging results were communicated on 04/19/2021 at 5:54 pm to provider Dr. Rory Percy via secure text paging. Electronically Signed   By: Merilyn Baba M.D.   On: 04/19/2021 17:54   CT ANGIO HEAD NECK W WO CM (CODE STROKE)  Result Date: 04/19/2021 CLINICAL DATA:  Neuro deficit, acute, stroke suspected EXAM: CT ANGIOGRAPHY HEAD AND NECK TECHNIQUE: Multidetector CT imaging of the head and neck was performed using the standard protocol during bolus administration of intravenous contrast. Multiplanar CT image reconstructions and MIPs were obtained to evaluate the vascular anatomy. Carotid stenosis measurements (when applicable) are obtained utilizing NASCET criteria, using the distal internal carotid diameter as the denominator. CONTRAST:  36mL OMNIPAQUE IOHEXOL 350 MG/ML SOLN COMPARISON:  MRA April 01, 2020. FINDINGS: CTA NECK FINDINGS Aortic arch: Atherosclerosis aorta great vessel origins, which are patent. Moderate stenosis of the left common carotid artery origin and proximal right subclavian artery. Right carotid system: Calcific and noncalcific atherosclerosis of the carotid bifurcation and proximal ICA without greater than 50% stenosis relative to the distal vessel. Left carotid system: Moderate stenosis of the common carotid artery origin. Bulky calcific atherosclerosis at the carotid bifurcation with approximately 40% stenosis of the proximal ICA Vertebral arteries: Severe stenosis of the right vertebral artery origin with poor opacification of the V2 vertebral artery and occlusion of the V3 vertebral artery. Severe stenosis of the proximal left  vertebral artery. Remainder of the left vertebral artery in the neck is patent. Skeleton: Degenerative change of the cervical spine multilevel facet arthropathy. Other neck: No evidence of acute abnormality Upper chest: Partially imaged mass in the anterior mediastinum which measures 4.3 x 2.7 cm, increased in size since 2017 CTA chest. Bronchial wall thickening and emphysema (paraseptal and centrallobular). Review of the MIP images confirms the above findings CTA HEAD FINDINGS Anterior circulation: Calcific atherosclerosis of bilateral intracranial ICAs with resulting mild multifocal stenosis. Bilateral M1 MCAs are patent. 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 Left M2 MCA branches are patent without proximal high-grade stenosis. Bilateral ACAs are patent. Severe stenosis of the distal right A2 and A3 ACA. Posterior circulation: Non opacified V3 vertebral artery with suspected severe stenosis at the dural margin when correalting with prior MRA. Faint opacification of the intradural right vertebral artery, likely from and retrograde flow. 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 Opacification of more distal intradural vertebral artery. Basilar artery is patent with mild irregularity/narrowing. Bilateral posterior cerebral arteries are small and difficult to follow distally, but appear to be patent proximally with multifocal stenoses there is likely severe distally on the left. Venous sinuses: As permitted by contrast timing, patent. Review of the MIP images confirms the above findings IMPRESSION:  CTA head: 1. 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 2. Bilateral PCAs are small and difficult to follow distally, but appear to be patent proximally with multifocal irregularity/stenosis that is likely severe distally on the left. 3. 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 4. Severe stenosis of the distal  right A2 and A3 ACA. CTA Neck: 1. Severe stenosis of the right vertebral artery origin with poor opacification of the V2 vertebral artery and non opacification of the V3 vertebral artery. Suspected severe stenosis at the dural margin. 2. Severe stenosis of the proximal left vertebral artery. 3. Moderate stenosis of the left common carotid artery origin and proximal right subclavian artery. 4. Bilateral carotid bifurcation atherosclerosis with approximately 40% stenosis of the left proximal ICA. 5. Partially imaged mass in the anterior mediastinum which measures 4.3 x 2.7 cm, increased in size since 2017 CTA chest. This could potentially represent a slowly enlarging malignancy or abnormal lymph node. Recommend CT chest with contrast or PET-CT for further evaluation. 6. Aortic Atherosclerosis (ICD10-I70.0) and Emphysema (ICD10-J43.9). Findings discussed with Dr. Karin Lieu via telephone at 6:40 p.m. Electronically Signed   By: Margaretha Sheffield M.D.   On: 04/19/2021 18:52    PHYSICAL EXAM  Temp:  [98.4 F (36.9 C)] 98.4 F (36.9 C) (10/31 1812) Pulse Rate:  [59-76] 60 (11/01 1230) Resp:  [13-28] 16 (11/01 1230) BP: (116-216)/(55-106) 149/69 (11/01 1230) SpO2:  [94 %-100 %] 99 % (11/01 1230) Weight:  [71.8 kg-72.6 kg] 71.8 kg (10/31 1700)  General - well developed, well nourished gentleman in NAD  Ophthalmologic - fundi not visualized due to noncooperation.  Cardiovascular - Regular rhythm and rate.  Mental Status -  Patient is awake, alert and oriented x 4. Speech is clear. Able to tell a coherent history.   Cranial Nerves II - XII - II - Visual field intact OU. III, IV, VI - Extraocular movements intact. V - Facial sensation intact bilaterally. VII - Facial movement intact bilaterally. VIII - Hearing & vestibular intact bilaterally. X - Palate elevates symmetrically. XI - Chin turning & shoulder shrug intact bilaterally. XII - Tongue protrusion intact.  Motor Strength - The patient's strength  was normal in all extremities and pronator drift was absent.  Bulk was normal and fasciculations were absent.   Motor Tone - Muscle tone was assessed at the neck and appendages and was normal.  Sensory - Light touch, temperature/pinprick were assessed and were symmetrical.    Coordination - The patient had normal movements in the hands and feet with no ataxia or dysmetria.  Tremor was absent.  Gait and Station - deferred.   ASSESSMENT/PLAN Mr. YASER HARVILL is a 65 y.o. male with history of tobacco use, essential hypertension, PVD on Xarelto, and CVAs who presented to the ED via EMS for evaluation of blurry vision and dizziness with reports of headache prior to hospital arrival. He states that he was driving home from his neurology appointment to rest when he became dizzy, had to pull over, and called his brother-in-law for help. His brother-in-law subsequently activated EMS who obtained an initial blood pressure of 208/92 mmHg.  Patient was seen on 10/26 in the ED with dizziness and MRI brain was obtained showing chronic infarction  Dizziness likely due to posterior circulation vascular stenosis in the setting of orthostatic hypotension  Code Stroke CT head No acute abnormality. ASPECTS 10.    CTA head Severe stenosis versus short-segment occlusion of the intradural left vertebral  artery. Bilateral PCAs are small and difficult to follow distally, but appear to be patent proximally with multifocal irregularity/stenosis that is likely severe distally on the left. 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 Severe stenosis of the distal right A2 and A3 ACA. CTA neck Severe stenosis of the right vertebral artery origin with poor opacification of the V2 vertebral artery and non opacification of the V3 vertebral artery. Suspected severe stenosis at the dural margin. Severe stenosis of the proximal left vertebral artery. Moderate stenosis of the left common carotid artery origin and proximal right  subclavian artery. Bilateral carotid bifurcation atherosclerosis with approximately 40% stenosis of the left proximal ICA. MRI 10/25 and 11/1 No evidence of recent infarction, hemorrhage, or mass. No significant change since recent prior study. Chronic microvascular ischemic changes and few chronic small vessel infarcts. 2D Echo EF 55% LDL pending HgbA1c pending VTE prophylaxis - SCD's Xarelto (rivaroxaban) daily prior to admission, now on aspirin 325 mg daily and clopidogrel 75 mg daily DAPT for 3 months and then plavix alone Therapy recommendations:  pending Disposition:  pending  Vertebrobasilar insufficiency CTA head and neck Severe stenosis versus short-segment occlusion of the intradural left vertebral artery. Severe stenosis of the right vertebral artery origin with poor opacification of the V2 vertebral artery and non opacification of the V3 vertebral artery. Suspected severe stenosis at the dural margin. Severe stenosis of the proximal left vertebral artery.  Pt symptoms could be due to b/l VA stenosis in the setting of hypotension / hypoperfusion BP goal 130-150 Pt prefer medical management first, will put on ASA 325 and plavix DAPT for 3 months and then plavix alone. High dose lipitor. Follow up with Dr. Leta Baptist If not effective or recurrent symptoms, will consider endovascular management at that time. Pt agrees.  Orthostatic hypotension Orthostatic vital lying BP 156/69, sitting 113/72 and standing 101/58 TED hose ordered D/c home HCTZ, OK with lisinopril Close BP monitoring at home and follow up closely with PCP Avoid getting up too fast, avoid long term standing or sitting Lie down if feeling dizzy  Hypertension Home meds:  lisinopril-HCTZ 10-12.5mg   Stable Orthostatic vital pending Long-term BP goal 130-150 given b/l VA high grade stenosis  Hyperlipidemia Home meds:  atorvastatin 40mg , resumed in hospital LDL pending, goal < 70 Increase lipitor to 80mg , maximized  the treatment given high grade stenosis Continue statin at discharge  Tobacco abuse Current smoker Smoking cessation counseling provided Pt is willing to quit  Other Stroke Risk Factors Advanced Age >/= 61  Hx stroke/TIA PVD  Other Active Problems CKD IIIA, Cre 1.48 Mediastinum mass CTA showed Partially imaged mass in the anterior mediastinum which measures 4.3 x 2.7 cm, increased in size since 2017 CTA chest. This could potentially represent a slowly enlarging malignancy or abnormal lymph node. Recommend CT chest with contrast or PET-CT for further evaluation.  Hospital day # 0  Beulah Gandy, NP  ATTENDING NOTE: I reviewed above note and agree with the assessment and plan. Pt was seen and examined.   65 year old male with history of hypertension, PVD, current smoker admitted for dizziness, diplopia, foggy headed.  Per note, he had intermittent dizziness since 02/2020, had stroke on 03/31/2020 due to dizziness and blurry vision.  MRI showed bilateral cerebellum infarct left bigger than right as well as small cerebellar vermis infarct.  He was initially treated with DAPT, however later changed to Xarelto to him 5 twice daily along with statin.  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  Had to sat down and rest, symptoms lasting 1 to 2 hours and resolved.  On 04/13/2021 CT and MRI showed no acute infarct, but old left PICA infarct.  Yesterday, he went to see Dr. Leta Baptist at [facility], at end of the clinic, he started having dizziness again, on the way home, he had to pull over due to dizziness, feeling eyes crossed and blurry vision.  His brother reported some confusion too.  He came to ER for evaluation.  MRI repeat no acute infarct.  CTA head and neck showed bilateral VA origin high-grade stenosis, right VA proximal occlusion, left VA V4 high-grade stenosis.  Right M2, A2, A3 and bilateral PCA left more than right high-grade  stenosis.  EF 55%, LDL and A1c pending.  Orthostatic vital lying BP 156/69, sitting 113/72 and standing 101/58.  UDS negative.  Creatinine 1.48.  On exam, patient awake alert, orientation x3, no aphasia, follows simple commands.  No nystagmus, no disconjugate gaze, no ataxia.  Neurologically intact, no focal deficit.  Etiology for patient's symptoms concerning for posterior circulation especially bilateral VA high-grade stenosis, vertebrobasilar insufficiency.  Not sure if he has any orthostatic hypotension, orthostatic vital showed orthostatic hypotension.  Recommend TED hose and adjust BP meds.  For treatment options, he prefer maximized medical treatment first and if fails, will consider endovascular treatment.  Will recommend aspirin 325 and Plavix 75 DAPT for 3 months and then Plavix alone.  High intensity Lipitor 80 daily.  BP goal 1 30-1 50 due to vascular stenosis.  Stroke risk factor modification, quit smoking.  Patient will follow up with Dr. Leta Baptist closely in neuro clinic at [facility].  For detailed assessment and plan, please refer to above as I have made changes wherever appropriate.   Neurology will sign off. Please call with questions. Pt will follow up with stroke clinic NP at [facility] in about 4 weeks. Thanks for the consult.   Rosalin Hawking, MD PhD Stroke Neurology 04/20/2021 3:42 PM  I had long discussion with patient and the brother at bedside, updated pt current condition, treatment plan and potential prognosis, and answered all the questions.  They expressed understanding and appreciation. I discussed with Dr. Algis Liming. I spent  35 minutes in total face-to-face time with the patient, more than 50% of which was spent in counseling and coordination of care, reviewing test results, images and medication, and discussing the diagnosis, treatment plan and potential prognosis. This patient's care requiresreview of multiple databases, neurological assessment, discussion with family, other specialists  and medical decision making of high complexity.      To contact Stroke Continuity provider, please refer to [email or web address] After hours, contact General Neurology

## 2021-04-20 NOTE — ED Notes (Signed)
PT at bedside.

## 2021-04-20 NOTE — ED Notes (Signed)
Reviewed discharge instructions with patient and support person. Follow-up care and medications reviewed. Patient and support person verbalized understanding. Patient A&Ox4, VSS, and ambulatory with steady gait upon discharge.  ?

## 2021-04-20 NOTE — Evaluation (Signed)
Physical Therapy Evaluation Patient Details Name: Caleb Cardenas MRN: [id number] DOB: 1955-09-04 Today's Date: 04/20/2021  History of Present Illness  ETAI COPADO is a 65 y.o. male with history of hypertension, prior stroke, hyperlipidemia, tobacco abuse has been experiencing intermittent dizziness with blurring of vision since September 2022.  has been seen in ED 10/25 with unremarkable MRI and had follow upwith neurologist today with plan for CT angiogram to check for vetebrobasilar insufficiency and had another episode on the way home.  Clinical Impression  Patient presents with independent mobility and no provocation of admitting symptoms.  Did vestibular screen with some positive components but nothing that replicates his symptoms.  He performed components of Dynamic Gait Index with head turns and quick turns no LOB or symptoms.  Patient stable for home and no follow up needs identified. PT to sign off.        Recommendations for follow up therapy are one component of a multi-disciplinary discharge planning process, led by the attending physician.  Recommendations may be updated based on patient status, additional functional criteria and insurance authorization.  Follow Up Recommendations No PT follow up    Assistance Recommended at Discharge None  Functional Status Assessment Patient has not had a recent decline in their functional status  Equipment Recommendations  None recommended by PT    Recommendations for Other Services       Precautions / Restrictions Precautions Precautions: None      Mobility  Bed Mobility Overal bed mobility: Independent                  Transfers Overall transfer level: Independent Equipment used: None                    Ambulation/Gait Ambulation/Gait assistance: Independent Gait Distance (Feet): 200 Feet Assistive device: None Gait Pattern/deviations: Step-through pattern     General Gait Details: performed  horizontal and vertical head movements, turn and stop, stepping over items and no LOB or symptoms noted  Stairs            Wheelchair Mobility    Modified Rankin (Stroke Patients Only)       Balance Overall balance assessment: Independent                               Standardized Balance Assessment Standardized Balance Assessment : Dynamic Gait Index   Dynamic Gait Index Level Surface: Normal Change in Gait Speed: Normal Gait with Horizontal Head Turns: Normal Gait with Vertical Head Turns: Normal Gait and Pivot Turn: Normal Step Over Obstacle: Normal Step Around Obstacles:  (NT) Steps:  (NT)       Pertinent Vitals/Pain Pain Assessment: No/denies pain    Home Living Family/patient expects to be discharged to:: Private residence Living Arrangements: Alone Available Help at Discharge: Available PRN/intermittently Type of Home: House Home Access: Level entry       Home Layout: One level Home Equipment: None      Prior Function Prior Level of Function : Independent/Modified Independent                     Hand Dominance        Extremity/Trunk Assessment   Upper Extremity Assessment Upper Extremity Assessment: Overall WFL for tasks assessed    Lower Extremity Assessment Lower Extremity Assessment: Overall WFL for tasks assessed       Communication   Communication: No difficulties  Cognition Arousal/Alertness: Awake/alert Behavior During Therapy: WFL for tasks assessed/performed;Impulsive (cues for safety with IV line) Overall Cognitive Status: Within Functional Limits for tasks assessed                                          General Comments    Vestibular Assessment - 04/20/21 0001       Symptom Behavior   Subjective history of current problem symptoms feel like I am stoned and lasts few seconds to hours    Frequency of Dizziness intermittent    Duration of Dizziness seconds to hours    Symptom  Nature Spontaneous    Aggravating Factors No known aggravating factors    Relieving Factors No known relieving factors    Progression of Symptoms Worse   longest lasting this episode     Oculomotor Exam   Oculomotor Alignment Normal    Ocular ROM WNL    Spontaneous Absent    Gaze-induced  Absent      Vestibulo-Ocular Reflex   VOR 1 Head Only (x 1 viewing) intact and no dizziness with horizontal head movements, some mild symptoms with vertical head movements    VOR to Slow Head Movement Positive bilaterally   but not symptomatic with testing   VOR Cancellation Corrective saccades   especially with moving head R     Auditory   Comments intact and equal to scratch test      Positional Testing   Sidelying Test Sidelying Right;Sidelying Left      Sidelying Right   Sidelying Right Duration 30-40 s    Sidelying Right Symptoms No nystagmus      Sidelying Left   Sidelying Left Duration 30-40 s    Sidelying Left Symptoms No nystagmus                Exercises     Assessment/Plan    PT Assessment Patient does not need any further PT services  PT Problem List         PT Treatment Interventions      PT Goals (Current goals can be found in the Care Plan section)  Acute Rehab PT Goals PT Goal Formulation: All assessment and education complete, DC therapy    Frequency     Barriers to discharge        Co-evaluation               AM-PAC PT "6 Clicks" Mobility  Outcome Measure Help needed turning from your back to your side while in a flat bed without using bedrails?: None Help needed moving from lying on your back to sitting on the side of a flat bed without using bedrails?: None Help needed moving to and from a bed to a chair (including a wheelchair)?: None Help needed standing up from a chair using your arms (e.g., wheelchair or bedside chair)?: None Help needed to walk in hospital room?: None Help needed climbing 3-5 steps with a railing? : None 6 Click  Score: 24    End of Session   Activity Tolerance: Patient tolerated treatment well Patient left: in bed;with family/visitor present   PT Visit Diagnosis: Other symptoms and signs involving the nervous system (R29.898)    Time: 1440-1506 PT Time Calculation (min) (ACUTE ONLY): 26 min   Charges:   PT Evaluation $PT Eval Low Complexity: 1 Low PT Treatments $Neuromuscular Re-education: 8-22 mins  Magda Kiel, PT Acute Rehabilitation Services Pager:[phone] Office:[phone] 04/20/2021   Reginia Naas 04/20/2021, 4:37 PM

## 2021-04-20 NOTE — Discharge Instructions (Signed)

## 2021-04-20 NOTE — Discharge Summary (Signed)
Physician Discharge Summary  Caleb Cardenas XVQ:[id number] DOB: [birth year]  PCP: [facility] [place]  Admitted from: Home Discharged to: Home  Admit date: 04/19/2021 Discharge date: 04/20/2021  Recommendations for Outpatient Follow-up:    Follow-up Information     Penumalli, Earlean Polka, MD. Schedule an appointment as soon as possible for a visit in 1 month(s).   Specialties: Neurology, Radiology Why: stroke clinic Contact information: [address] [place] [address] [place], [place] an appointment as soon as possible for a visit in 1 week(s).   Why: To be seen with repeat labs (CBC & BMP).  Please follow-up with your family physician regarding further evaluation of chest mass that was seen on CT scan of head and neck. Contact information: [place] [phone] [place]: Pending PT and OT input and recommendations prior to DC    Equipment/Devices: Pending PT and OT input and recommendation    Discharge Condition: Improved and stable   Code Status: Full Code Diet recommendation:  Discharge Diet Orders (From admission, onward)     Start     Ordered   04/20/21 0000  Diet - low sodium heart healthy        04/20/21 1609             Discharge Diagnoses:  Principal Problem:   Hypertensive urgency Active Problems:   Dizziness   CKD (chronic kidney disease) stage 3, GFR 30-59 ml/min (HCC)   Brief Summary: 65 year old male with medical history significant for hypertension, prior stroke, hyperlipidemia, tobacco use disorder, presented to the [facility] ED on 04/19/2021 with complaints of experiencing intermittent dizziness with blurring of vision since September 2022.  He had come to the ER on October 25 and had an MRI of the brain which was unremarkable and patient had follow-up with his neurologist on day of admission and plan was to get  CT angiogram of the head and neck as outpatient for concern for vertebrobasilar insufficiency.  On the way back to home, patient felt very dizzy and called his brother-in-law who had EMS bring patient to the hospital.  Patient reports dizziness mostly on exertion.  He feels like he gets blurred vision like a cloud.  Sometimes only lasts for 5 minutes but sometimes it can be prolonged.  On day of admission, it was more prolonged than usual.  He denied any weakness of the extremities.  Denied difficulty swallowing or speaking.  He recently follow-up with his cardiologist.  Per admitting MD "ED Course: In the ER patient appears nonfocal.  Blood pressure was more than 833 systolic.  Patient had CT angiogram of the head and neck which showed severe multifocal stenosis.  Neurologist on-call was consulted.  Plan was to admit and closely observe.  Control blood pressure.  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  EKG shows normal sinus rhythm.  Labs show creatinine 1.5 WBC 11.8 COVID test negative.  Patient was given Valium.  At the time of admitting MDs exam patient's blood pressure systolic was 825."   Assessment and plan:  Suspected vertebrobasilar insufficiency: Neurology was consulted and assisted with evaluation and management.  As per neurology, patient with history of HTN, PVD, current smoker admitted for dizziness, diplopia and foggy headed.  He had intermittent dizziness since 02/2020, had a stroke on 03/31/2020 due to dizziness and blurred vision.  MRI head showed bilateral cerebellar infarcts left bigger than right as well as small cerebellar vermis infarct.  He was initially treated with DAPT, however later changed to Xarelto along with statin.  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  He had to sit down and rest, symptoms lasted 1 to 2 hours and resolved.  On 04/13/2021, CT  and MRI showed no acute infarct, but old left PICA infarct.  MRI brain repeated and no acute infarct.  CTA head and neck showed bilateral vertebral artery origin high-grade stenosis, right VA proximal occlusion, left VA V4 high-grade stenosis.  Right M2, A2, A3 and bilateral PCA left more than right high-grade stenosis.  Detailed reports are as below.  2D echo shows LVEF 55%.  LDL and A1c are pending at time of this dictation.  UDS negative.  As per neurology input and my discussion with Dr.Xu, patient's symptomology concerning for posterior circulation especially bilateral VA high-grade stenosis, vertebral basilar insufficiency in the context of orthostatic hypotension. He discussed with patient in detail regarding options of maximizing medical treatment versus endovascular treatment.  Patient reportedly opted for medical treatment first and if it fails then will consider endovascular treatment.  Per neurology, DAPT with aspirin 325 Mg daily and Plavix 75 Mg daily for 3 months followed by Plavix alone.  Atorvastatin has been increased to 80 mg daily.  BP goal of 130-150 due to vascular stenosis.  Patient counseled regarding quitting smoking.  Patient to follow-up with Dr. Leta Baptist as outpatient.  Xarelto obviously discontinued.  Orthostatic hypotension: Supine BP 156/69, pulse rate 58, dropped to standing 108/58 and pulse rate of 65.  Discontinued HCTZ, continue lisinopril alone and placed order for compression stockings.  Close follow-up outpatient.  Patient has also been counseled regarding gradual positional changes to minimize symptoms.  Hyperlipidemia: LDL pending.  Neurology is increased atorvastatin to 80 mg daily.  Recommend following lipid panel and LFTs in 6 weeks.  Essential hypertension/hypertensive urgency: Management as indicated above, discontinued HCTZ and continuing lisinopril alone.  Tobacco use disorder: Cessation counseled.  Stage IIIa CKD: Creatinine appears to be close to baseline.   Discontinued HCTZ.  Continue lisinopril.  Close follow-up of BMP as outpatient.  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  This could potentially represent a slowly enlarging malignancy or abnormal lymph node.  Radiology recommends CT chest with contrast or PET/CT for further evaluation.  Patient has been specifically counseled that he has to closely follow-up this with his PCP regarding further evaluation as per recommendations.  Patient is aware of this diagnosis.  Prior CVA: As per neurology recommendations now, Xarelto discontinued, patient on DAPT and statins increase.  Outpatient follow-up with neurology.  PAD: DAPT and statins.  Patient's Xarelto had been on hold for planned tooth extraction which reportedly has been postponed.  Echo with wall motion abnormality: Inferior wall hypokinesis noted on 2D echo.  This can be further evaluated as outpatient by cardiology consultation to be arranged by PCP.   Consultations: Neurology  Procedures: None   Discharge Instructions  Discharge Instructions     Ambulatory referral to Neurology   Complete by: As directed    Follow up with Dr. Leta Baptist at [facility] in 4  weeks. Pt is Dr. Gladstone Lighter pt. Thanks.   Call MD for:   Complete by: As directed    Strokelike symptoms.   Call MD for:  extreme fatigue   Complete by: As directed    Call MD for:  persistant dizziness or light-headedness   Complete by: As directed    Diet - low sodium heart healthy   Complete by: As directed    Increase activity slowly   Complete by: As directed         Medication List     STOP taking these medications    lisinopril-hydrochlorothiazide 10-12.5 MG tablet Commonly known as: ZESTORETIC   Xarelto 2.5 MG Tabs tablet Generic drug: rivaroxaban       TAKE these medications    aspirin 325 MG tablet Take 1 tablet (325 mg  total) by mouth daily. To be taken totally for 3 months and then stop. Start taking on: April 21, 2021   atorvastatin 80 MG tablet Commonly known as: LIPITOR Take 1 tablet (80 mg total) by mouth at bedtime. What changed:  medication strength how much to take   clopidogrel 75 MG tablet Commonly known as: PLAVIX Take 1 tablet (75 mg total) by mouth daily. Start taking on: April 21, 2021   lisinopril 10 MG tablet Commonly known as: ZESTRIL Take 1 tablet (10 mg total) by mouth at bedtime.   Multivitamin Adult Tabs Take 1 tablet by mouth 3 (three) times a week.   Omega 3 1000 MG Caps Take 1,000 mg by mouth daily as needed (when not eating vegetables).       No Known Allergies    Procedures/Studies: CT HEAD WO CONTRAST  Result Date: 04/13/2021 CLINICAL DATA:  Neuro deficit, acute, stroke suspected.  Dizziness EXAM: CT HEAD WITHOUT CONTRAST TECHNIQUE: Contiguous axial images were obtained from the base of the skull through the vertex without intravenous contrast. COMPARISON:  04/01/2020 FINDINGS: Brain: Old left cerebellar infarct. There is atrophy and chronic small vessel disease changes. No acute intracranial abnormality. Specifically, no hemorrhage, hydrocephalus, mass lesion, acute infarction, or significant intracranial injury. Vascular: No hyperdense vessel or unexpected calcification. Skull: No acute calvarial abnormality. Sinuses/Orbits: No acute findings Other: None IMPRESSION: Old left cerebellar infarct. Atrophy, chronic microvascular disease. No acute intracranial abnormality. Electronically Signed   By: Rolm Baptise M.D.   On: 04/13/2021 17:24   MR BRAIN WO CONTRAST  Result Date: 04/20/2021 CLINICAL DATA:  Stroke, follow up EXAM: MRI HEAD WITHOUT CONTRAST TECHNIQUE: Multiplanar, multiecho pulse sequences of the brain and surrounding structures were obtained without intravenous contrast. COMPARISON:  04/13/2021 FINDINGS: Brain: There is no acute infarction or  intracranial hemorrhage. There is no intracranial mass, mass effect, or edema. There is no hydrocephalus or extra-axial fluid collection. Patchy and confluent T2 hyperintensity in the supratentorial white matter is nonspecific but probably reflects stable chronic microvascular ischemic changes. There are small chronic infarcts of the central white matter and basal ganglia as well as the left greater than right cerebellum. Ventricles and sulci are normal in size and configuration. Vascular: Major vessel flow voids at the skull base are preserved. Skull and upper cervical spine: Normal marrow signal is preserved. Sinuses/Orbits: Paranasal sinuses are aerated. Orbits are unremarkable. Other: Sella is unremarkable.  Mastoid air cells are clear. IMPRESSION: No evidence of recent infarction, hemorrhage, or mass. No significant change since recent prior study. Chronic microvascular ischemic changes and few chronic small vessel infarcts. Electronically Signed   By: Macy Mis M.D.   On: 04/20/2021  09:16   MR BRAIN WO CONTRAST  Result Date: 04/13/2021 CLINICAL DATA:  Initial evaluation for neuro deficit, stroke suspected. EXAM: MRI HEAD WITHOUT CONTRAST TECHNIQUE: Multiplanar, multiecho pulse sequences of the brain and surrounding structures were obtained without intravenous contrast. COMPARISON:  CT from earlier the same day. FINDINGS: Brain: Cerebral volume within normal limits for age. Scattered patchy T2/FLAIR hyperintensity involving the periventricular and deep white matter both cerebral hemispheres as well as the pons, most consistent with chronic small vessel ischemic disease, moderate in nature. Few scattered associated remote lacunar infarcts present about the bilateral basal ganglia. Remote left PICA territory infarct involving the inferior left cerebellum noted. Associated mild chronic hemosiderin staining. Few additional tiny right cerebellar infarcts noted as well. No abnormal foci of restricted  diffusion to suggest acute or subacute ischemia. Gray-white matter differentiation otherwise maintained. No other areas of remote cortical infarction. No evidence for acute intracranial hemorrhage. No mass lesion, midline shift or mass effect. No hydrocephalus or extra-axial fluid collection. Pituitary gland suprasellar region within normal limits. Midline structures intact. Vascular: Major intracranial vascular flow voids are maintained. Skull and upper cervical spine: Craniocervical junction within normal limits. Bone marrow signal intensity normal. No scalp soft tissue abnormality. Sinuses/Orbits: Globes and orbital soft tissues within normal limits. Moderate mucosal thickening noted within the ethmoidal air cells. Paranasal sinuses are otherwise largely clear. Trace left mastoid effusion noted, of doubtful significance. Inner ear structures grossly normal. Other: None. IMPRESSION: 1. No acute intracranial abnormality. 2. Remote left PICA territory infarct, with a few additional tiny right cerebellar infarcts. 3. Underlying moderate chronic microvascular ischemic disease. Electronically Signed   By: Jeannine Boga M.D.   On: 04/13/2021 23:26   ECHOCARDIOGRAM COMPLETE  Result Date: 04/20/2021    ECHOCARDIOGRAM REPORT   Patient Name:   Caleb Cardenas Date of Exam: 04/20/2021 Medical Rec #:  [id number]       Height:       70.0 in Accession #:    [id number]      Weight:       158.3 lb Date of Birth:  1955-06-29       BSA:          1.890 m Patient Age:    61 years        BP:           139/66 mmHg Patient Gender: M               HR:           61 bpm. Exam Location:  Inpatient Procedure: 2D Echo, Cardiac Doppler and Color Doppler Indications:    Stroke  History:        Patient has prior history of Echocardiogram examinations, most                 recent 06/26/2020. Risk Factors:Hypertension.  Sonographer:    Merrie Roof RDCS Referring Phys: [id number] [place]  1. Hypokinesis of the inferior wall  (base, mid). Left ventricular ejection fraction, by estimation, is 55%. The left ventricle has normal function. There is mild concentric left ventricular hypertrophy. Left ventricular diastolic parameters are consistent with Grade I diastolic dysfunction (impaired relaxation). Elevated left atrial pressure.  2. Right ventricular systolic function is normal. The right ventricular size is normal.  3. Multiple jets of MR. The mitral valve is myxomatous. Mild mitral valve regurgitation.  4. The aortic valve is tricuspid. Aortic valve regurgitation is mild to moderate. Mild to moderate aortic valve sclerosis/calcification  is present, without any evidence of aortic stenosis.  5. The inferior vena cava is normal in size with greater than 50% respiratory variability, suggesting right atrial pressure of 3 mmHg. FINDINGS  Left Ventricle: Hypokinesis of the inferior wall (base, mid). Left ventricular ejection fraction, by estimation, is 55%. The left ventricle has normal function. The left ventricular internal cavity size was normal in size. There is mild concentric left ventricular hypertrophy. Left ventricular diastolic parameters are consistent with Grade I diastolic dysfunction (impaired relaxation). Elevated left atrial pressure. Right Ventricle: The right ventricular size is normal. Right vetricular wall thickness was not assessed. Right ventricular systolic function is normal. Left Atrium: Left atrial size was normal in size. Right Atrium: Right atrial size was normal in size. Pericardium: Trivial pericardial effusion is present. Mitral Valve: Multiple jets of MR. The mitral valve is myxomatous. There is mild thickening of the mitral valve leaflet(s). Mild mitral valve regurgitation. Tricuspid Valve: The tricuspid valve is normal in structure. Tricuspid valve regurgitation is trivial. Aortic Valve: The aortic valve is tricuspid. Aortic valve regurgitation is mild to moderate. Mild to moderate aortic valve  sclerosis/calcification is present, without any evidence of aortic stenosis. Aortic valve mean gradient measures 5.0 mmHg. Aortic valve peak gradient measures 8.9 mmHg. Aortic valve area, by VTI measures 1.84 cm. Pulmonic Valve: The pulmonic valve was normal in structure. Pulmonic valve regurgitation is not visualized. Venous: The inferior vena cava is normal in size with greater than 50% respiratory variability, suggesting right atrial pressure of 3 mmHg. IAS/Shunts: No atrial level shunt detected by color flow Doppler.  LEFT VENTRICLE PLAX 2D LVIDd:         4.60 cm   Diastology LVIDs:         3.30 cm   LV e' medial:    4.03 cm/s LV PW:         1.20 cm   LV E/e' medial:  17.5 LV IVS:        1.40 cm   LV e' lateral:   5.87 cm/s LVOT diam:     2.20 cm   LV E/e' lateral: 12.0 LV SV:         59 LV SV Index:   31 LVOT Area:     3.80 cm  RIGHT VENTRICLE RV Basal diam:  3.40 cm RV S prime:     12.90 cm/s TAPSE (M-mode): 2.5 cm LEFT ATRIUM           Index        RIGHT ATRIUM           Index LA diam:      4.30 cm 2.28 cm/m   RA Area:     18.40 cm LA Vol (A2C): 75.8 ml 40.11 ml/m  RA Volume:   52.30 ml  27.67 ml/m LA Vol (A4C): 47.4 ml 25.08 ml/m  AORTIC VALVE AV Area (Vmax):    1.95 cm AV Area (Vmean):   1.79 cm AV Area (VTI):     1.84 cm AV Vmax:           149.00 cm/s AV Vmean:          101.000 cm/s AV VTI:            0.322 m AV Peak Grad:      8.9 mmHg AV Mean Grad:      5.0 mmHg LVOT Vmax:         76.30 cm/s LVOT Vmean:        47.600  cm/s LVOT VTI:          0.156 m LVOT/AV VTI ratio: 0.48  AORTA Ao Root diam: 3.10 cm Ao Asc diam:  3.10 cm MITRAL VALVE MV Area (PHT): 3.17 cm    SHUNTS MV Decel Time: 239 msec    Systemic VTI:  0.16 m MV E velocity: 70.70 cm/s  Systemic Diam: 2.20 cm MV A velocity: 81.80 cm/s MV E/A ratio:  0.86 Dorris Carnes MD Electronically signed by Dorris Carnes MD Signature Date/Time: 04/20/2021/2:45:57 PM    Final    CT HEAD CODE STROKE WO CONTRAST  Result Date: 04/19/2021 CLINICAL DATA:   Code stroke.  Speech and vision changes EXAM: CT HEAD WITHOUT CONTRAST TECHNIQUE: Contiguous axial images were obtained from the base of the skull through the vertex without intravenous contrast. COMPARISON:  04/13/2021. FINDINGS: Brain: No evidence of acute infarction, hemorrhage, cerebral edema, mass, mass effect, or midline shift. Ventricles and sulci are normal for age. No extra-axial fluid collection. Vascular: No hyperdense vessel or unexpected calcification. Skull: Normal. Negative for fracture or focal lesion. Sinuses/Orbits: Mild mucosal thickening in the ethmoid air cells. The orbits are unremarkable. Other: The mastoid air cells are well aerated. ASPECTS [facility] Stroke Program Early CT Score) - Ganglionic level infarction (caudate, lentiform nuclei, internal capsule, insula, M1-M3 cortex): 7 - Supraganglionic infarction (M4-M6 cortex): 3 Total score (0-10 with 10 being normal): 10 IMPRESSION: 1. No acute intracranial process. 2. ASPECTS is 10 Code stroke imaging results were communicated on 04/19/2021 at 5:54 pm to provider Dr. Rory Percy via secure text paging. Electronically Signed   By: Merilyn Baba M.D.   On: 04/19/2021 17:54   CT ANGIO HEAD NECK W WO CM (CODE STROKE)  Result Date: 04/19/2021 CLINICAL DATA:  Neuro deficit, acute, stroke suspected EXAM: CT ANGIOGRAPHY HEAD AND NECK TECHNIQUE: Multidetector CT imaging of the head and neck was performed using the standard protocol during bolus administration of intravenous contrast. Multiplanar CT image reconstructions and MIPs were obtained to evaluate the vascular anatomy. Carotid stenosis measurements (when applicable) are obtained utilizing NASCET criteria, using the distal internal carotid diameter as the denominator. CONTRAST:  110mL OMNIPAQUE IOHEXOL 350 MG/ML SOLN COMPARISON:  MRA April 01, 2020. FINDINGS: CTA NECK FINDINGS Aortic arch: Atherosclerosis aorta great vessel origins, which are patent. Moderate stenosis of the left common carotid  artery origin and proximal right subclavian artery. Right carotid system: Calcific and noncalcific atherosclerosis of the carotid bifurcation and proximal ICA without greater than 50% stenosis relative to the distal vessel. Left carotid system: Moderate stenosis of the common carotid artery origin. Bulky calcific atherosclerosis at the carotid bifurcation with approximately 40% stenosis of the proximal ICA Vertebral arteries: Severe stenosis of the right vertebral artery origin with poor opacification of the V2 vertebral artery and occlusion of the V3 vertebral artery. Severe stenosis of the proximal left vertebral artery. Remainder of the left vertebral artery in the neck is patent. Skeleton: Degenerative change of the cervical spine multilevel facet arthropathy. Other neck: No evidence of acute abnormality Upper chest: Partially imaged mass in the anterior mediastinum which measures 4.3 x 2.7 cm, increased in size since 2017 CTA chest. Bronchial wall thickening and emphysema (paraseptal and centrallobular). Review of the MIP images confirms the above findings CTA HEAD FINDINGS Anterior circulation: Calcific atherosclerosis of bilateral intracranial ICAs with resulting mild multifocal stenosis. Bilateral M1 MCAs are patent. 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 Left M2 MCA branches are patent without proximal high-grade stenosis. Bilateral ACAs are  patent. Severe stenosis of the distal right A2 and A3 ACA. Posterior circulation: Non opacified V3 vertebral artery with suspected severe stenosis at the dural margin when correalting with prior MRA. Faint opacification of the intradural right vertebral artery, likely from and retrograde flow. 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 Opacification of more distal intradural vertebral artery. Basilar artery is patent with mild irregularity/narrowing. Bilateral posterior cerebral arteries are small and difficult to  follow distally, but appear to be patent proximally with multifocal stenoses there is likely severe distally on the left. Venous sinuses: As permitted by contrast timing, patent. Review of the MIP images confirms the above findings IMPRESSION: CTA head: 1. 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 2. Bilateral PCAs are small and difficult to follow distally, but appear to be patent proximally with multifocal irregularity/stenosis that is likely severe distally on the left. 3. 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 4. Severe stenosis of the distal right A2 and A3 ACA. CTA Neck: 1. Severe stenosis of the right vertebral artery origin with poor opacification of the V2 vertebral artery and non opacification of the V3 vertebral artery. Suspected severe stenosis at the dural margin. 2. Severe stenosis of the proximal left vertebral artery. 3. Moderate stenosis of the left common carotid artery origin and proximal right subclavian artery. 4. Bilateral carotid bifurcation atherosclerosis with approximately 40% stenosis of the left proximal ICA. 5. Partially imaged mass in the anterior mediastinum which measures 4.3 x 2.7 cm, increased in size since 2017 CTA chest. This could potentially represent a slowly enlarging malignancy or abnormal lymph node. Recommend CT chest with contrast or PET-CT for further evaluation. 6. Aortic Atherosclerosis (ICD10-I70.0) and Emphysema (ICD10-J43.9). Findings discussed with Dr. Karin Lieu via telephone at 6:40 p.m. Electronically Signed   By: Margaretha Sheffield M.D.   On: 04/19/2021 18:52      Subjective: Patient seen in the ED.  Insistent on being discharged today otherwise threatening to leave AMA.  Denies dizziness or blurred vision since hospital admission.  No other strokelike symptoms reported.  Discharge Exam:  Vitals:   04/20/21 0800 04/20/21 0935 04/20/21 1230 04/20/21 1440  BP: (!) 116/99 (!) 147/77 (!) 149/69 138/69   Pulse: 73 63 60 63  Resp: 16 17 16 17   Temp:      TempSrc:      SpO2: 94% 100% 99% 98%  Weight:        General: Pt lying comfortably in bed & appears in no obvious distress. Cardiovascular: S1 & S2 heard, RRR, S1/S2 +. No murmurs, rubs, gallops or clicks. No JVD or pedal edema.  Telemetry personally reviewed: Sinus rhythm. Respiratory: Clear to auscultation without wheezing, rhonchi or crackles. No increased work of breathing. Abdominal:  Non distended, non tender & soft. No organomegaly or masses appreciated. Normal bowel sounds heard. CNS: Alert and oriented. No focal deficits. Extremities: no edema, no cyanosis    The results of significant diagnostics from this hospitalization (including imaging, microbiology, ancillary and laboratory) are listed below for reference.     Microbiology: Recent Results (from the past 240 hour(s))  Resp Panel by RT-PCR (Flu A&B, Covid) Nasopharyngeal Swab     Status: None   Collection Time: 04/13/21  5:16 PM   Specimen: Nasopharyngeal Swab; Nasopharyngeal(NP) swabs in vial transport medium  Result Value Ref Range Status   SARS Coronavirus 2 by RT PCR NEGATIVE NEGATIVE Final    Comment: (NOTE) SARS-CoV-2 target nucleic acids  are NOT DETECTED.  The SARS-CoV-2 RNA is generally detectable in upper respiratory specimens during the acute phase of infection. The lowest concentration of SARS-CoV-2 viral copies this assay can detect is 138 copies/mL. A negative result does not preclude SARS-Cov-2 infection and should not be used as the sole basis for treatment or other patient management decisions. A negative result may occur with  improper specimen collection/handling, submission of specimen other than nasopharyngeal swab, presence of viral mutation(s) within the areas targeted by this assay, and inadequate number of viral copies(<138 copies/mL). A negative result must be combined with clinical observations, patient history, and  epidemiological information. The expected result is Negative.  Fact Sheet for Patients:  EntrepreneurPulse.com.au  Fact Sheet for Healthcare Providers:  IncredibleEmployment.be  This test is no t yet approved or cleared by the [place] FDA and  has been authorized for detection and/or diagnosis of SARS-CoV-2 by FDA under an Emergency Use Authorization (EUA). This EUA will remain  in effect (meaning this test can be used) for the duration of the COVID-19 declaration under Section 564(b)(1) of the Act, 21 U.S.C.section 360bbb-3(b)(1), unless the authorization is terminated  or revoked sooner.       Influenza A by PCR NEGATIVE NEGATIVE Final   Influenza B by PCR NEGATIVE NEGATIVE Final    Comment: (NOTE) The Xpert Xpress SARS-CoV-2/FLU/RSV plus assay is intended as an aid in the diagnosis of influenza from Nasopharyngeal swab specimens and should not be used as a sole basis for treatment. Nasal washings and aspirates are unacceptable for Xpert Xpress SARS-CoV-2/FLU/RSV testing.  Fact Sheet for Patients: EntrepreneurPulse.com.au  Fact Sheet for Healthcare Providers: IncredibleEmployment.be  This test is not yet approved or cleared by the [place] FDA and has been authorized for detection and/or diagnosis of SARS-CoV-2 by FDA under an Emergency Use Authorization (EUA). This EUA will remain in effect (meaning this test can be used) for the duration of the COVID-19 declaration under Section 564(b)(1) of the Act, 21 U.S.C. section 360bbb-3(b)(1), unless the authorization is terminated or revoked.  Performed at [facility], [address], [place], [place] [address]   Resp Panel by RT-PCR (Flu A&B, Covid) Nasopharyngeal Swab     Status: None   Collection Time: 04/19/21  7:51 PM   Specimen: Nasopharyngeal Swab; Nasopharyngeal(NP) swabs in vial transport medium  Result Value Ref  Range Status   SARS Coronavirus 2 by RT PCR NEGATIVE NEGATIVE Final    Comment: (NOTE) SARS-CoV-2 target nucleic acids are NOT DETECTED.  The SARS-CoV-2 RNA is generally detectable in upper respiratory specimens during the acute phase of infection. The lowest concentration of SARS-CoV-2 viral copies this assay can detect is 138 copies/mL. A negative result does not preclude SARS-Cov-2 infection and should not be used as the sole basis for treatment or other patient management decisions. A negative result may occur with  improper specimen collection/handling, submission of specimen other than nasopharyngeal swab, presence of viral mutation(s) within the areas targeted by this assay, and inadequate number of viral copies(<138 copies/mL). A negative result must be combined with clinical observations, patient history, and epidemiological information. The expected result is Negative.  Fact Sheet for Patients:  EntrepreneurPulse.com.au  Fact Sheet for Healthcare Providers:  IncredibleEmployment.be  This test is no t yet approved or cleared by the [place] FDA and  has been authorized for detection and/or diagnosis of SARS-CoV-2 by FDA under an Emergency Use Authorization (EUA). This EUA will remain  in effect (meaning this test can be used)  for the duration of the COVID-19 declaration under Section 564(b)(1) of the Act, 21 U.S.C.section 360bbb-3(b)(1), unless the authorization is terminated  or revoked sooner.       Influenza A by PCR NEGATIVE NEGATIVE Final   Influenza B by PCR NEGATIVE NEGATIVE Final    Comment: (NOTE) The Xpert Xpress SARS-CoV-2/FLU/RSV plus assay is intended as an aid in the diagnosis of influenza from Nasopharyngeal swab specimens and should not be used as a sole basis for treatment. Nasal washings and aspirates are unacceptable for Xpert Xpress SARS-CoV-2/FLU/RSV testing.  Fact Sheet for  Patients: EntrepreneurPulse.com.au  Fact Sheet for Healthcare Providers: IncredibleEmployment.be  This test is not yet approved or cleared by the [place] FDA and has been authorized for detection and/or diagnosis of SARS-CoV-2 by FDA under an Emergency Use Authorization (EUA). This EUA will remain in effect (meaning this test can be used) for the duration of the COVID-19 declaration under Section 564(b)(1) of the Act, 21 U.S.C. section 360bbb-3(b)(1), unless the authorization is terminated or revoked.  Performed at [facility], [place] [address] [place], [place] [address]      Labs: CBC: Recent Labs  Lab 04/13/21 1642 04/19/21 1745 04/19/21 1747 04/20/21 0432  WBC 11.6* 11.8*  --  10.0  NEUTROABS 6.5 6.2  --   --   HGB 14.7 15.1 15.0 15.2  HCT 43.0 44.0 44.0 44.8  MCV 86.5 88.7  --  89.1  PLT 246 225  --  497    Basic Metabolic Panel: Recent Labs  Lab 04/13/21 1642 04/19/21 1745 04/19/21 1747 04/20/21 0432  NA 134* 137 139 137  K 3.4* 3.5 3.4* 4.5  CL 102 106 105 107  CO2 20* 21*  --  24  GLUCOSE 95 126* 124* 101*  BUN 25* 24* 25* 24*  CREATININE 1.39* 1.50* 1.40* 1.48*  CALCIUM 9.2 8.8*  --  8.4*    Liver Function Tests: Recent Labs  Lab 04/13/21 1642 04/19/21 1745  AST 19 23  ALT 18 21  ALKPHOS 88 84  BILITOT 0.4 0.9  PROT 7.4 6.8  ALBUMIN 4.3 3.8    CBG: Recent Labs  Lab 04/19/21 1742  GLUCAP 126*     Time coordinating discharge: 35 minutes  SIGNED:  Vernell Leep, MD, FACP, [facility]. [facility]  To contact the attending provider between 7A-7P or the covering provider during after hours 7P-7A, please log into the web site [email or web address] and access using universal [place] password for that web site. If you do not have the password, please call the hospital operator.

## 2021-04-20 NOTE — ED Notes (Signed)
Neuro provider at bedside  

## 2021-04-20 NOTE — ED Notes (Signed)
Asked secretary to page OT so pt can be evaluated so pt can be discharged

## 2021-04-20 NOTE — ED Notes (Signed)
Pt transported to MRI 

## 2021-04-20 NOTE — Progress Notes (Signed)
  Echocardiogram 2D Echocardiogram has been performed.  Merrie Roof F 04/20/2021, 2:11 PM

## 2021-05-18 ENCOUNTER — Other Ambulatory Visit: Payer: Self-pay | Admitting: Diagnostic Neuroimaging

## 2021-05-18 ENCOUNTER — Ambulatory Visit
Admission: RE | Admit: 2021-05-18 | Discharge: 2021-05-18 | Disposition: A | Discharge status: Home / Self Care | Payer: Medicare HMO | Source: Ambulatory Visit | Attending: Diagnostic Neuroimaging | Admitting: Diagnostic Neuroimaging

## 2021-05-18 DIAGNOSIS — I63433 Cerebral infarction due to embolism of bilateral posterior cerebral arteries: Secondary | ICD-10-CM

## 2021-05-18 IMAGING — CT CT ANGIO HEAD-NECK (W OR W/O PERF)
1 of 4 series · 6 of 30 positions shown · IV contrast (APPLIED)
Comparison: Brain MRI [DATE] and CTA of the head neck
[DATE]

CLINICAL DATA: TIA.  Dizziness and lightheadedness.

EXAM:
CT ANGIOGRAPHY HEAD AND NECK
TECHNIQUE: Multidetector CT imaging of the head and neck was performed using
the standard protocol during bolus administration of intravenous
contrast. Multiplanar CT image reconstructions and MIPs were
obtained to evaluate the vascular anatomy. Carotid stenosis
measurements (when applicable) are obtained utilizing NASCET
criteria, using the distal internal carotid diameter as the
denominator.
CONTRAST:  50mL [ZB] IOPAMIDOL ([ZB]) INJECTION 76%

[Series 7: head/neck angio · axial · 0.45mm/px · z∈[-314,-58]mm · 6 of 180 slices shown]
[im 26/180  brain]
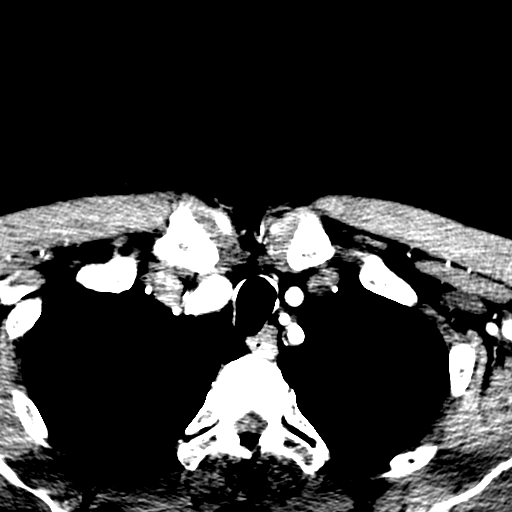
[im 52/180  bone]
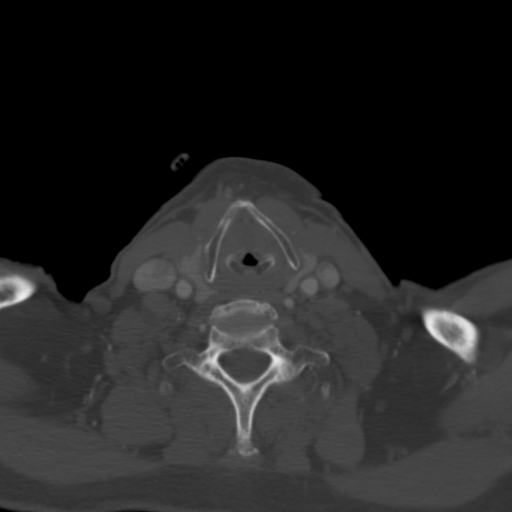
[im 77/180  brain]
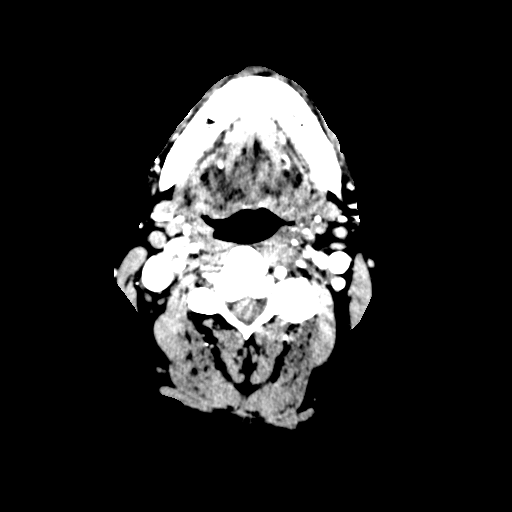
[im 103/180  bone]
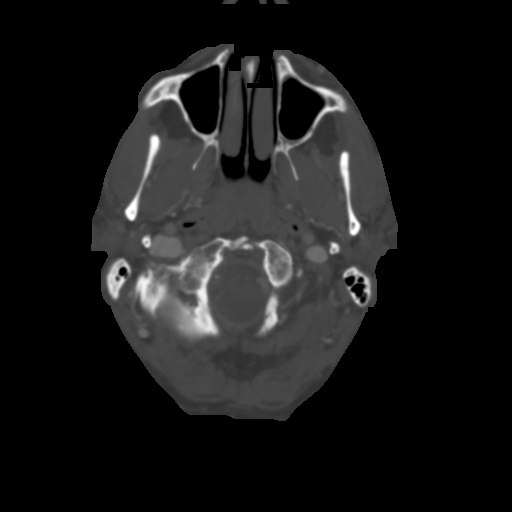
[im 128/180  brain]
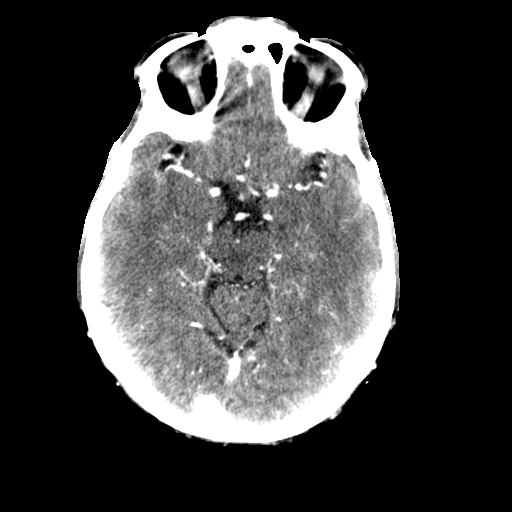
[im 154/180  bone]
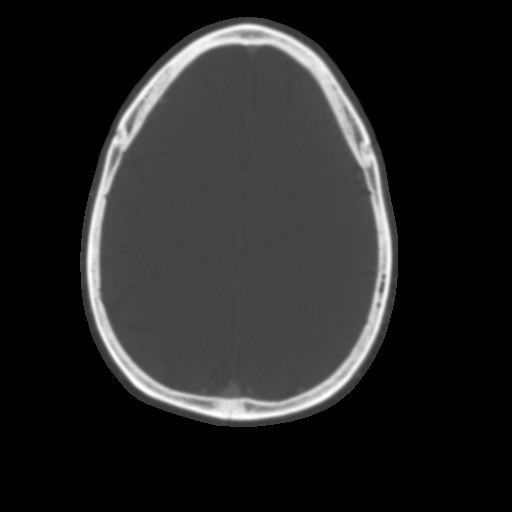

[6 of 30 positions shown; findings below may reference images not displayed]

FINDINGS: CT HEAD FINDINGS

Brain: Chronic cerebellar infarcts bilaterally which are small and
more numerous on the left. Mild small vessel ischemic change in the
hemispheric white matter. Chronic lacune at the bilateral caudate
head. Brain volume normal.

Vascular: See below

Skull: Negative

Sinuses: Negative

Orbits: Negative

Review of the MIP images confirms the above findings

CTA NECK FINDINGS

Aortic arch: Atheromatous plaque.  4 vessel branching.

Right carotid system: Primarily low-density wall thickening of the
common carotid which is atheromatous. Mixed density plaque at the
bifurcation, primarily calcified. No flow limiting stenosis or
ulceration.

Left carotid system: Diffuse low-density plaque. Origin stenosis
measures 65%. There is bulky calcified plaque at the bifurcation
with proximal ICA stenosis measuring up to 60% at the origin. No
beading or dissection.

Vertebral arteries: Left vertebral artery arises from the arch the
origin stenosis measuring 65%. Atheromatous calcification of the
proximal subclavian arteries without flow limiting stenosis.
Atheromatous irregularity of the right V1 segment with 65% stenosis.
Subsequently diminutive flow related to a occlusion at the level of
the dura.

Skeleton: Cervical facet and disc degeneration.

Other neck: Retention cyst appearance at the vallecula.

Upper chest: Paraseptal emphysema.

Review of the MIP images confirms the above findings

CTA HEAD FINDINGS

Anterior circulation: Suboptimal luminal opacification. Atheromatous
calcification of the carotid siphons. No branch occlusion, beading,
or detected aneurysm. Atheromatous irregularity of MCA ACA branches
is very limited when compared to prior.

Posterior circulation: Left dominant vertebral artery. Heavy
calcified plaque at the V4 segments with occlusion at the dura on
the right and reconstituted right distal V4 segment feeding right
PICA. Left V4 segment plaque causes 75% stenosis. Atheromatous
irregularity of bilateral posterior cerebral arteries.

Venous sinuses: Diffusely patent

Anatomic variants: Incomplete circle-of-Willis at the posterior
communicating arteries.

Review of the MIP images confirms the above findings
IMPRESSION: Advanced atheromatous disease in the posterior circulation:

*65% bilateral V1 segment stenosis
*Distal right vertebral occlusion at the level of the dura. The
distal right V4 segment is reconstituted from the basilar, feeding
the right PICA.
*At least 75% stenosis at the left V4 segment. Limited collateral
flow due to circle-of-Willis discontinuity.

Left common carotid origin stenosis measuring 65%. Left ICA origin
stenosis measuring 60%.

Technically limited intracranial CTA. No detected change from study
approximally 1 month ago.

## 2021-05-18 MED ORDER — IOPAMIDOL (ISOVUE-370) INJECTION 76%
50.0000 mL | Freq: Once | INTRAVENOUS | Status: AC | PRN
  Administered 2021-05-18: 50 mL via INTRAVENOUS

## 2021-05-19 ENCOUNTER — Encounter (HOSPITAL_COMMUNITY): Payer: Self-pay | Admitting: Radiology

## 2021-05-19 ENCOUNTER — Encounter: Payer: Self-pay | Admitting: Adult Health

## 2021-05-19 ENCOUNTER — Ambulatory Visit (INDEPENDENT_AMBULATORY_CARE_PROVIDER_SITE_OTHER): Payer: Medicare HMO

## 2021-05-19 DIAGNOSIS — I639 Cerebral infarction, unspecified: Secondary | ICD-10-CM

## 2021-05-19 DIAGNOSIS — R42 Dizziness and giddiness: Secondary | ICD-10-CM

## 2021-05-19 DIAGNOSIS — I493 Ventricular premature depolarization: Secondary | ICD-10-CM | POA: Diagnosis not present

## 2021-05-20 ENCOUNTER — Encounter: Payer: Self-pay | Admitting: *Deleted

## 2021-05-25 ENCOUNTER — Encounter: Payer: Self-pay | Admitting: Diagnostic Neuroimaging

## 2021-05-25 ENCOUNTER — Ambulatory Visit: Payer: Medicare HMO | Admitting: Diagnostic Neuroimaging

## 2021-05-25 VITALS — BP 126/84 | HR 78 | Ht 70.0 in | Wt 163.0 lb

## 2021-05-25 DIAGNOSIS — I63112 Cerebral infarction due to embolism of left vertebral artery: Secondary | ICD-10-CM

## 2021-05-25 DIAGNOSIS — I63433 Cerebral infarction due to embolism of bilateral posterior cerebral arteries: Secondary | ICD-10-CM | POA: Diagnosis not present

## 2021-05-25 DIAGNOSIS — J9859 Other diseases of mediastinum, not elsewhere classified: Secondary | ICD-10-CM | POA: Diagnosis not present

## 2021-05-25 MED ORDER — ATORVASTATIN CALCIUM 80 MG PO TABS: 80.0000 mg | ORAL_TABLET | Freq: Every day | ORAL | 4 refills | Status: DC

## 2021-05-25 MED ORDER — CLOPIDOGREL BISULFATE 75 MG PO TABS: 75.0000 mg | ORAL_TABLET | Freq: Every day | ORAL | 4 refills | Status: DC

## 2021-05-25 NOTE — Patient Instructions (Signed)
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

## 2021-05-25 NOTE — Progress Notes (Signed)
[facility]  PATIENT: Caleb Cardenas DOB: 07/30/1955  REFERRING CLINICIAN: Rosalin Hawking, MD HISTORY FROM: patient  REASON FOR VISIT: follow up   [place]:  Chief Complaint  Patient presents with   Follow-up    Rm 6 here for hospital follow up     [place]:   UPDATE (05/25/21, VRP): Since last visit, went to ER later on 04/19/21; now on aspirin and plavix and off xarelto. No recurrent symptoms. Still smoking, but cutting down. Tolerating meds.   UPDATE (04/19/21, VRP): Since last visit, doing poorly. More dizziness, double vision, lightheaded, euphoria, fog spells (1-2 per week; few minutes; intermittent).     PRIOR HPI: (04/21/20; VRP): 65 year old male with cigarette smoking, hypertension, here for evaluation of stroke.  Patient has had intermittent episodes of dizziness since September 2021.  He went to urgent care several times for these symptoms.  Symptoms suddenly worsened on 03/31/2020 when he became very dizzy and had blurred vision.  Patient went to the hospital on 04/01/2020 for evaluation.  MRI of the brain showed acute stroke in the left cerebellum, slightly in the right cerebellum and midline cerebellum.  Significant intracranial atherosclerosis also noted the bilateral vertebral arteries.  Patient was discharged home with follow-up in outpatient neurology.  Since that time symptoms have improved.  He has continued on aspirin and Plavix.  He still smoking half pack cigarettes per day but try to cut down.  He drinks 3 Mountain Dew's per day.  He has not followed up with PCP yet.   REVIEW OF SYSTEMS: Full 14 system review of systems performed and negative with exception of: As per HPI.  ALLERGIES: No Known Allergies  HOME MEDICATIONS: Outpatient Medications Prior to Visit  Medication Sig Dispense Refill   aspirin 325 MG tablet Take 1 tablet (325 mg total) by mouth daily. To be taken totally for 3 months and  then stop. 30 tablet 2   lisinopril (ZESTRIL) 10 MG tablet Take 1 tablet (10 mg total) by mouth at bedtime. 30 tablet 1   Multiple Vitamin (MULTIVITAMIN ADULT) TABS Take 1 tablet by mouth 3 (three) times a week.     Omega 3 1000 MG CAPS Take 1,000 mg by mouth daily as needed (when not eating vegetables).     atorvastatin (LIPITOR) 80 MG tablet Take 1 tablet (80 mg total) by mouth at bedtime. 30 tablet 1   clopidogrel (PLAVIX) 75 MG tablet Take 1 tablet (75 mg total) by mouth daily. 30 tablet 1   No facility-administered medications prior to visit.    PAST MEDICAL HISTORY: Past Medical History:  Diagnosis Date   Blurred vision    Dizziness    Hypertension    Stroke (cerebrum) ([place]) 03/2020   Vertigo     PAST SURGICAL HISTORY: Past Surgical History:  Procedure Laterality Date   KNEE SURGERY     x2  in high school    FAMILY HISTORY: Family History  Problem Relation Age of Onset   Heart attack Mother    Alzheimer's disease Father    Pancreatic cancer Brother     SOCIAL HISTORY: Social History   Socioeconomic History   Marital status: Divorced    Spouse name: Not on file   Number of children: 1   Years of education: Not on file   Highest education level: Some college, no degree  Occupational History    Comment: house flipping, painting  Tobacco Use   Smoking status: Every Day  Packs/day: 0.50    Types: Cigarettes    Passive exposure: Never   Smokeless tobacco: Never  Vaping Use   Vaping Use: Never used  Substance and Sexual Activity   Alcohol use: Yes    Comment: occ   Drug use: No   Sexual activity: Not on file  Other Topics Concern   Not on file  Social History Narrative   Lives alone   Caffeine- sodas 2-3 a day   Social Determinants of Health   Financial Resource Strain: Not on file  Food Insecurity: Not on file  Transportation Needs: Not on file  Physical Activity: Not on file  Stress: Not on file  Social Connections: Not on file  Intimate  Partner Violence: Not on file     PHYSICAL EXAM  GENERAL EXAM/CONSTITUTIONAL: Vitals:  Vitals:   05/25/21 1329  BP: 126/84  Pulse: 78  SpO2: 98%  Weight: 163 lb (73.9 kg)  Height: 5\' 10"  (1.778 m)   Body mass index is 23.39 kg/m. Wt Readings from Last 3 Encounters:  05/25/21 163 lb (73.9 kg)  04/19/21 158 lb 4.6 oz (71.8 kg)  04/19/21 160 lb (72.6 kg)   Patient is in no distress; well developed, nourished and groomed; neck is supple  CARDIOVASCULAR: Examination of carotid arteries is normal; no carotid bruits Regular rate and rhythm, no murmurs Examination of peripheral vascular system by observation and palpation is normal  EYES: Ophthalmoscopic exam of optic discs and posterior segments is normal; no papilledema or hemorrhages No results found.  MUSCULOSKELETAL: Gait, strength, tone, movements noted in Neurologic exam below  NEUROLOGIC: MENTAL STATUS:  No flowsheet data found. awake, alert, oriented to person, place and time recent and remote memory intact normal attention and concentration language fluent, comprehension intact, naming intact fund of knowledge appropriate  CRANIAL NERVE:  2nd - no papilledema on fundoscopic exam 2nd, 3rd, 4th, 6th - pupils equal and reactive to light, visual fields full to confrontation, extraocular muscles intact, no nystagmus 5th - facial sensation symmetric 7th - facial strength symmetric 8th - hearing intact 9th - palate elevates symmetrically, uvula midline 11th - shoulder shrug symmetric 12th - tongue protrusion midline  MOTOR:  normal bulk and tone, full strength in the BUE, BLE  SENSORY:  normal and symmetric to light touch, temperature, vibration  COORDINATION:  finger-nose-finger, fine finger movements normal; EXCEPT SLIGHTLY SLOWER ON LEFT SIDE   REFLEXES:  deep tendon reflexes present and symmetric  GAIT/STATION:  narrow based gait     DIAGNOSTIC DATA (LABS, IMAGING, TESTING) - I reviewed  patient records, labs, notes, testing and imaging myself where available.  Lab Results  Component Value Date   WBC 10.0 04/20/2021   HGB 15.2 04/20/2021   HCT 44.8 04/20/2021   MCV 89.1 04/20/2021   PLT 215 04/20/2021      Component Value Date/Time   NA 137 04/20/2021 0432   K 4.5 04/20/2021 0432   CL 107 04/20/2021 0432   CO2 24 04/20/2021 0432   GLUCOSE 101 (H) 04/20/2021 0432   BUN 24 (H) 04/20/2021 0432   CREATININE 1.48 (H) 04/20/2021 0432   CALCIUM 8.4 (L) 04/20/2021 0432   PROT 6.8 04/19/2021 1745   ALBUMIN 3.8 04/19/2021 1745   AST 23 04/19/2021 1745   ALT 21 04/19/2021 1745   ALKPHOS 84 04/19/2021 1745   BILITOT 0.9 04/19/2021 1745   GFRNONAA 52 (L) 04/20/2021 0432   GFRAA 58 (L) 10/06/2015 1408   Lab Results  Component Value Date  CHOL 199 04/21/2020   HDL 25 (L) 04/21/2020   LDLCALC 76 04/21/2020   TRIG 621 (HH) 04/21/2020   CHOLHDL 8.0 (H) 04/21/2020   Lab Results  Component Value Date   HGBA1C 6.0 (H) 04/21/2020   No results found for: VITAMINB12 No results found for: TSH   04/01/20 MRI brain, MRA head / neck [I reviewed images myself and agree with interpretation. -VRP]  1. Bilateral posterior circulation acute ischemia with a relatively large left PICA territory infarct. No hemorrhage or mass effect. 2. Suspect occlusion or stenosis of the distal left P3 segment.  3. 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 4. Short segment moderate stenosis of the proximal left external carotid artery.  04/13/21 MRI brain 1. No acute intracranial abnormality. 2. Remote left PICA territory infarct, with a few additional tiny right cerebellar infarcts. 3. Underlying moderate chronic microvascular ischemic disease.   04/19/21 CTA head / neck CTA head: 1. 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 2. Bilateral PCAs are small and difficult to follow distally, but appear to be patent  proximally with multifocal irregularity/stenosis that is likely severe distally on the left. 3. 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 4. Severe stenosis of the distal right A2 and A3 ACA.   CTA Neck: 1. Severe stenosis of the right vertebral artery origin with poor opacification of the V2 vertebral artery and non opacification of the V3 vertebral artery. Suspected severe stenosis at the dural margin. 2. Severe stenosis of the proximal left vertebral artery. 3. Moderate stenosis of the left common carotid artery origin and proximal right subclavian artery. 4. Bilateral carotid bifurcation atherosclerosis with approximately 40% stenosis of the left proximal ICA. 5. Partially imaged mass in the anterior mediastinum which measures 4.3 x 2.7 cm, increased in size since 2017 CTA chest. This could potentially represent a slowly enlarging malignancy or abnormal lymph node. Recommend CT chest with contrast or PET-CT for further evaluation. 6. Aortic Atherosclerosis (ICD10-I70.0) and Emphysema (ICD10-J43.9).  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 The distal right V4 segment is reconstituted from the basilar, feeding the right PICA. *At least 75% stenosis at the left V4 segment. 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 - Left common carotid origin stenosis measuring 65%. Left ICA origin stenosis measuring 60%. - Technically limited intracranial CTA. No detected change from study approximally 1 month ago.     ASSESSMENT AND PLAN  65 y.o. year old male here with:   Dx:  1. 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 Cerebrovascular accident (CVA) due to embolism of left vertebral artery ([place])   3. 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; embolic) - continue aspirin + plavix through end of Jan 2023; starting Jul 21, 2021 continue plavix 75mg  daily alone - continue atorvastatin - continue BP control (SBP goal 120-140 due to severe posterior circulation atherosclerosis) - if recurrent symptoms then may need to consider endovascular stenting / revascularization - daily physical activity / exercise (at least 15-30 minutes) - eat more plants / vegetables - increase social activities, brain stimulation, games, puzzles, hobbies, crafts, arts, music - aim for at least 7-8 hours sleep per night (or more) - avoid smoking and alcohol  MEDIASTINAL MASS - follow up with PCP; refer to pulmonology  DENTAL PROCEDURE CLEARANCE - recommend to wait until August 18, 2021, if able to wait  Meds ordered this encounter  Medications   atorvastatin (LIPITOR) 80 MG tablet    Sig: Take 1 tablet (80 mg total) by mouth at bedtime.    Dispense:  90 tablet    Refill:  4   clopidogrel (PLAVIX) 75 MG tablet    Sig: Take 1 tablet (75 mg total) by mouth daily.    Dispense:  90 tablet    Refill:  4   Orders Placed This Encounter  Procedures   Ambulatory referral to Pulmonology   Return in about 6 months (around 11/23/2021).    Penni Bombard, MD 16/06/958, 4:54 PM Certified in Neurology, Neurophysiology and Neuroimaging  [facility] [address] [place], [place] [address] [phone]

## 2021-06-09 ENCOUNTER — Institutional Professional Consult (permissible substitution): Payer: Medicare HMO | Admitting: Pulmonary Disease

## 2021-06-15 NOTE — Progress Notes (Signed)
PCP:  [facility] Cardiologist: None Electrophysiologist: Thompson Grayer, MD   Caleb Cardenas is a 65 y.o. male seen today for Thompson Grayer, MD for routine electrophysiology followup.  Since last being seen in our clinic the patient reports doing well.   He has not had any syncope, Heart racing, lightheadedness or dizziness.  He plans to get his dentition addressed once he is off DAPT with ASA + Plavix after February 2nd.  This is a staged procedure which may take several months.  He is willing to consider loop recorder. He is not sure he wants a TEE at all.   Past Medical History:  Diagnosis Date   Blurred vision    Dizziness    Hypertension    Stroke (cerebrum) ([place]) 03/2020   Vertigo    Past Surgical History:  Procedure Laterality Date   KNEE SURGERY     x2  in high school    Current Outpatient Medications  Medication Sig Dispense Refill   aspirin 325 MG tablet Take 1 tablet (325 mg total) by mouth daily. To be taken totally for 3 months and then stop. 30 tablet 2   atorvastatin (LIPITOR) 80 MG tablet Take 1 tablet (80 mg total) by mouth at bedtime. 90 tablet 4   clopidogrel (PLAVIX) 75 MG tablet Take 1 tablet (75 mg total) by mouth daily. 90 tablet 4   lisinopril (ZESTRIL) 10 MG tablet Take 1 tablet (10 mg total) by mouth at bedtime. 30 tablet 1   Multiple Vitamin (MULTIVITAMIN ADULT) TABS Take 1 tablet by mouth 3 (three) times a week.     Omega 3 1000 MG CAPS Take 1,000 mg by mouth daily as needed (when not eating vegetables).     No current facility-administered medications for this visit.    No Known Allergies  Social History   Socioeconomic History   Marital status: Divorced    Spouse name: Not on file   Number of children: 1   Years of education: Not on file   Highest education level: Some college, no degree  Occupational History    Comment: house flipping, painting  Tobacco Use   Smoking status: Every Day    Packs/day: 0.50    Types:  Cigarettes    Passive exposure: Never   Smokeless tobacco: Never  Vaping Use   Vaping Use: Never used  Substance and Sexual Activity   Alcohol use: Yes    Comment: occ   Drug use: No   Sexual activity: Yes  Other Topics Concern   Not on file  Social History Narrative   Lives alone   Caffeine- sodas 2-3 a day   Social Determinants of Health   Financial Resource Strain: Not on file  Food Insecurity: Not on file  Transportation Needs: Not on file  Physical Activity: Not on file  Stress: Not on file  Social Connections: Not on file  Intimate Partner Violence: Not on file     Review of Systems: All other systems reviewed and are otherwise negative except as noted above.  Physical Exam: Vitals:   06/22/21 0806  BP: 120/64  Pulse: 62  SpO2: 92%  Weight: 162 lb 12.8 oz (73.8 kg)  Height: 5\' 10"  (1.778 m)   GEN- The patient is well appearing, alert and oriented x 3 today.   HEENT: normocephalic, atraumatic; sclera clear, conjunctiva pink; hearing intact; oropharynx clear; neck supple, no JVP Lymph- no cervical lymphadenopathy Lungs- Clear to ausculation bilaterally, normal work of breathing.  No wheezes, rales, rhonchi Heart- Regular rate and rhythm, no murmurs, rubs or gallops, PMI not laterally displaced GI- soft, non-tender, non-distended, bowel sounds present, no hepatosplenomegaly Extremities- no clubbing, cyanosis, or edema; DP/PT/radial pulses 2+ bilaterally MS- no significant deformity or atrophy Skin- warm and dry, no rash or lesion Psych- euthymic mood, full affect Neuro- strength and sensation are intact  EKG is not ordered. Personal review of EKG from  04/2021  shows NSR at 73 bpm  Additional studies reviewed include: Previous EP office notes.   Assessment and Plan:  1. Cryptogenic stroke CHA2DS2VASC would be at least 4 if AF is diagnosed for unadjusted Ischemic Stroke Rate (% per year) of ~4%.  He has been recommended for Loop and TEE. Dentition felt  to be prohibitive from TEE. Pending procedures as below, which would be staged interventions over several months per patient.  He is willing to consider ILR alone, pending neurology and attending MD recommendation.   2. NSVT Start toprol 25 mg qhs.    3. Poor dentition Pending dental procedure(s) once off DAPT in February.      Follow up with Dr. Quentin Ore in 3 months   Shirley Friar, PA-C  06/22/21 8:10 AM

## 2021-06-16 ENCOUNTER — Telehealth: Payer: Self-pay | Admitting: Internal Medicine

## 2021-06-16 NOTE — Telephone Encounter (Signed)
New Message:    Merleen Nicely calling from [place] with results.

## 2021-06-16 NOTE — Telephone Encounter (Signed)
See other phone note for heart monitor.

## 2021-06-16 NOTE — Telephone Encounter (Signed)
Patient states he was returning call. Please advise ?

## 2021-06-16 NOTE — Telephone Encounter (Signed)
Spoke with Caleb Cardenas and advised of monitor alert on 06/14/2021.  Caleb Cardenas does not recall any symptoms or being aware of any issues.  Caleb Cardenas advised Per Dr Quentin Ore continue monitoring and follow up as scheduled.  Caleb Cardenas verbalizes understanding and agrees with current plan.

## 2021-06-16 NOTE — Telephone Encounter (Addendum)
° °  Cardiac Monitor Alert  Date of alert:  06/16/2021   Patient Name: Caleb Cardenas  DOB: 1956-03-16  MRN: [id number]   [facility] Cardiologist: None  [facility] EP:  Thompson Grayer, MD    Monitor Information: Cardiac Event Monitor [[employer]]  Reason:  Cryptogenic Stroke Ordering provider:  Trude Mcburney   Alert Ventricular Tachycardia This is the 1st alert for this rhythm.   Next Cardiology Appointment   Date:  06/22/2021  Provider:  Oda Kilts, PA-C  The patient could NOT be reached by telephone today.  Left voicemail message for pt to contact triage at [phone]. Arrhythmia, symptoms and history reviewed with Dr Quentin Ore.   Plan:  Continue monitoring    Thora Lance, RN  06/16/2021 9:36 AM

## 2021-06-22 ENCOUNTER — Other Ambulatory Visit: Payer: Self-pay

## 2021-06-22 ENCOUNTER — Other Ambulatory Visit: Payer: Self-pay | Admitting: Internal Medicine

## 2021-06-22 ENCOUNTER — Ambulatory Visit: Payer: Medicare HMO | Admitting: Student

## 2021-06-22 ENCOUNTER — Encounter: Payer: Self-pay | Admitting: Student

## 2021-06-22 VITALS — BP 120/64 | HR 62 | Ht 70.0 in | Wt 162.8 lb

## 2021-06-22 DIAGNOSIS — K089 Disorder of teeth and supporting structures, unspecified: Secondary | ICD-10-CM

## 2021-06-22 DIAGNOSIS — I4729 Other ventricular tachycardia: Secondary | ICD-10-CM

## 2021-06-22 DIAGNOSIS — I639 Cerebral infarction, unspecified: Secondary | ICD-10-CM

## 2021-06-22 DIAGNOSIS — I493 Ventricular premature depolarization: Secondary | ICD-10-CM

## 2021-06-22 DIAGNOSIS — R42 Dizziness and giddiness: Secondary | ICD-10-CM

## 2021-06-22 MED ORDER — METOPROLOL SUCCINATE ER 25 MG PO TB24: 25.0000 mg | ORAL_TABLET | Freq: Every day | ORAL | 3 refills | Status: DC

## 2021-06-22 NOTE — Patient Instructions (Signed)
Medication Instructions:  Your physician recommends that you continue on your current medications as directed. Please refer to the Current Medication list given to you today.  *If you need a refill on your cardiac medications before your next appointment, please call your pharmacy*   Lab Work: None If you have labs (blood work) drawn today and your tests are completely normal, you will receive your results only by: MyChart Message (if you have MyChart) OR A paper copy in the mail If you have any lab test that is abnormal or we need to change your treatment, we will call you to review the results.   Follow-Up: At [facility], you and your health needs are our priority.  As part of our continuing mission to provide you with exceptional heart care, we have created designated Provider Care Teams.  These Care Teams include your primary Cardiologist (physician) and Advanced Practice Providers (APPs -  Physician Assistants and Nurse Practitioners) who all work together to provide you with the care you need, when you need it.  Your next appointment:   3 month(s)  The format for your next appointment:   In Person  Provider:   Cameron Lambert, MD{     

## 2021-07-01 ENCOUNTER — Encounter: Payer: Self-pay | Admitting: Pulmonary Disease

## 2021-07-01 ENCOUNTER — Other Ambulatory Visit: Payer: Self-pay

## 2021-07-01 ENCOUNTER — Ambulatory Visit: Payer: Medicare HMO | Admitting: Pulmonary Disease

## 2021-07-01 VITALS — BP 136/78 | HR 60 | Temp 98.0°F | Ht 70.0 in | Wt 165.4 lb

## 2021-07-01 DIAGNOSIS — J9859 Other diseases of mediastinum, not elsewhere classified: Secondary | ICD-10-CM | POA: Diagnosis not present

## 2021-07-01 DIAGNOSIS — F17210 Nicotine dependence, cigarettes, uncomplicated: Secondary | ICD-10-CM

## 2021-07-01 DIAGNOSIS — Z720 Tobacco use: Secondary | ICD-10-CM

## 2021-07-01 NOTE — Patient Instructions (Addendum)
Thank you for visiting Dr. Valeta Harms at [facility]. Today we recommend the following:  Orders Placed This Encounter  Procedures   NM PET Image Initial (PI) Skull Base To Thigh (F-18 FDG)   Ambulatory referral to Cardiothoracic Surgery   Pulmonary Function Test   PFTs next available   Return in about 3 months (around 09/29/2021) for with Eric Form, NP, or Dr. Valeta Harms.    Please do your part to reduce the spread of COVID-19.

## 2021-07-01 NOTE — Progress Notes (Signed)
Synopsis: Referred in January 2023 for anterior mediastinal mass by [facility]  Subjective:   PATIENT ID: Caleb Cardenas GENDER: male DOB: May 01, 1956, MRN: [id number]  Chief Complaint  Patient presents with   Consult    Patient is here to talk about mass.    This is a 66 year old gentleman that was seen for strokelike symptoms in the emergency department in October.  Work-up at the time revealed CT of the head neck which caught a portion of the anterior mediastinum.  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  Patient was referred today for evaluation.  He is a current smoker and has been a smoker for a long time.  Currently at about half a pack per day.  From respiratory standpoint has no symptoms at this time.  Denies fevers chills weight loss night sweats.   Past Medical History:  Diagnosis Date   Blurred vision    Dizziness    Hypertension    Stroke (cerebrum) ([place]) 03/2020   Vertigo      Family History  Problem Relation Age of Onset   Heart attack Mother    Alzheimer's disease Father    Pancreatic cancer Brother      Past Surgical History:  Procedure Laterality Date   KNEE SURGERY     x2  in high school    Social History   Socioeconomic History   Marital status: Divorced    Spouse name: Not on file   Number of children: 1   Years of education: Not on file   Highest education level: Some college, no degree  Occupational History    Comment: house flipping, painting  Tobacco Use   Smoking status: Every Day    Packs/day: 0.50    Types: Cigarettes    Passive exposure: Never   Smokeless tobacco: Never  Vaping Use   Vaping Use: Never used  Substance and Sexual Activity   Alcohol use: Yes    Comment: occ   Drug use: No   Sexual activity: Yes  Other Topics Concern   Not on file  Social History Narrative   Lives alone   Caffeine- sodas 2-3 a day   Social Determinants of Health   Financial Resource Strain: Not on file  Food  Insecurity: Not on file  Transportation Needs: Not on file  Physical Activity: Not on file  Stress: Not on file  Social Connections: Not on file  Intimate Partner Violence: Not on file     No Known Allergies   Outpatient Medications Prior to Visit  Medication Sig Dispense Refill   aspirin 325 MG tablet Take 1 tablet (325 mg total) by mouth daily. To be taken totally for 3 months and then stop. 30 tablet 2   atorvastatin (LIPITOR) 80 MG tablet Take 1 tablet (80 mg total) by mouth at bedtime. 90 tablet 4   clopidogrel (PLAVIX) 75 MG tablet Take 1 tablet (75 mg total) by mouth daily. 90 tablet 4   lisinopril (ZESTRIL) 10 MG tablet Take 1 tablet (10 mg total) by mouth at bedtime. 30 tablet 1   metoprolol succinate (TOPROL XL) 25 MG 24 hr tablet Take 1 tablet (25 mg total) by mouth daily. 90 tablet 3   Multiple Vitamin (MULTIVITAMIN ADULT) TABS Take 1 tablet by mouth 3 (three) times a week.     Omega 3 1000 MG CAPS Take 1,000 mg by mouth daily as needed (when not eating vegetables).     No facility-administered medications prior  to visit.    Review of Systems  Constitutional:  Negative for chills, fever, malaise/fatigue and weight loss.  HENT:  Negative for hearing loss, sore throat and tinnitus.   Eyes:  Negative for blurred vision and double vision.  Respiratory:  Negative for cough, hemoptysis, sputum production, shortness of breath, wheezing and stridor.   Cardiovascular:  Negative for chest pain, palpitations, orthopnea, leg swelling and PND.  Gastrointestinal:  Negative for abdominal pain, constipation, diarrhea, heartburn, nausea and vomiting.  Genitourinary:  Negative for dysuria, hematuria and urgency.  Musculoskeletal:  Negative for joint pain and myalgias.  Skin:  Negative for itching and rash.  Neurological:  Negative for dizziness, tingling, weakness and headaches.  Endo/Heme/Allergies:  Negative for environmental allergies. Does not bruise/bleed easily.   Psychiatric/Behavioral:  Negative for depression. The patient is not nervous/anxious and does not have insomnia.   All other systems reviewed and are negative.   Objective:  Physical Exam Vitals reviewed.  Constitutional:      General: He is not in acute distress.    Appearance: He is well-developed.  HENT:     Head: Normocephalic and atraumatic.  Eyes:     General: No scleral icterus.    Conjunctiva/sclera: Conjunctivae normal.     Pupils: Pupils are equal, round, and reactive to light.  Neck:     Vascular: No JVD.     Trachea: No tracheal deviation.  Cardiovascular:     Rate and Rhythm: Normal rate and regular rhythm.     Heart sounds: No murmur heard. Pulmonary:     Effort: Pulmonary effort is normal. No tachypnea, accessory muscle usage or respiratory distress.     Breath sounds: No stridor. No wheezing, rhonchi or rales.  Abdominal:     General: There is no distension.     Palpations: Abdomen is soft.     Tenderness: There is no abdominal tenderness.  Musculoskeletal:        General: No tenderness.     Cervical back: Neck supple.  Lymphadenopathy:     Cervical: No cervical adenopathy.  Skin:    General: Skin is warm and dry.     Capillary Refill: Capillary refill takes less than 2 seconds.     Findings: No rash.  Neurological:     Mental Status: He is alert and oriented to person, place, and time.  Psychiatric:        Behavior: Behavior normal.     Vitals:   07/01/21 1610  BP: 136/78  Pulse: 60  Temp: 98 F (36.7 C)  TempSrc: Oral  SpO2: 96%  Weight: 165 lb 6.4 oz (75 kg)  Height: 5\' 10"  (1.778 m)   96% on RA BMI Readings from Last 3 Encounters:  07/01/21 23.73 kg/m  06/22/21 23.36 kg/m  05/25/21 23.39 kg/m   Wt Readings from Last 3 Encounters:  07/01/21 165 lb 6.4 oz (75 kg)  06/22/21 162 lb 12.8 oz (73.8 kg)  05/25/21 163 lb (73.9 kg)     CBC    Component Value Date/Time   WBC 10.0 04/20/2021 0432   RBC 5.03 04/20/2021 0432   HGB  15.2 04/20/2021 0432   HCT 44.8 04/20/2021 0432   PLT 215 04/20/2021 0432   MCV 89.1 04/20/2021 0432   MCH 30.2 04/20/2021 0432   MCHC 33.9 04/20/2021 0432   RDW 13.8 04/20/2021 0432   LYMPHSABS 4.5 (H) 04/19/2021 1745   MONOABS 0.7 04/19/2021 1745   EOSABS 0.2 04/19/2021 1745   BASOSABS 0.1 04/19/2021 1745  Chest Imaging: Head neck CT October 2022 Approximate 3 cm anterior mediastinal mass. The patient's images have been independently reviewed by me.    Pulmonary Functions Testing Results: No flowsheet data found.  FeNO:   Pathology:   Echocardiogram:   Heart Catheterization:     Assessment & Plan:     ICD-10-CM   1. Mediastinal mass  J98.59 NM PET Image Initial (PI) Skull Base To Thigh (F-18 FDG)    Ambulatory referral to Cardiothoracic Surgery    Pulmonary Function Test    2. Mass of mediastinum  J98.59     3. Tobacco use  Z72.0     4. Cigarette smoker  F17.210       Discussion:  This is a 66 year old gentleman, found to have an incidentally found anterior mediastinal mass approximately 3 cm in size.  No other systemic symptoms at this time.  He is a tobacco user and a longtime smoker.  Plan: I think next step for evaluation of the mediastinal mass would include a PET scan. I have placed orders for this. Due to it being within the anterior mediastinum may benefit for him to see thoracic surgery. It is adjacent to the sternum so accessed via percutaneous approach for biopsy could be completed versus direct resection.  I will let thoracic surgery see patient and make decision.  If IR felt that this was difficult to access we could always consider a robotic bronchoscopy approach however we would be crossing the pleura to access the space which will slightly increase the risk of pneumothorax. I will also order PFTs to be complete. Patient was also counseled on smoking cessation today in the office.   Current Outpatient Medications:    aspirin 325 MG tablet,  Take 1 tablet (325 mg total) by mouth daily. To be taken totally for 3 months and then stop., Disp: 30 tablet, Rfl: 2   atorvastatin (LIPITOR) 80 MG tablet, Take 1 tablet (80 mg total) by mouth at bedtime., Disp: 90 tablet, Rfl: 4   clopidogrel (PLAVIX) 75 MG tablet, Take 1 tablet (75 mg total) by mouth daily., Disp: 90 tablet, Rfl: 4   lisinopril (ZESTRIL) 10 MG tablet, Take 1 tablet (10 mg total) by mouth at bedtime., Disp: 30 tablet, Rfl: 1   metoprolol succinate (TOPROL XL) 25 MG 24 hr tablet, Take 1 tablet (25 mg total) by mouth daily., Disp: 90 tablet, Rfl: 3   Multiple Vitamin (MULTIVITAMIN ADULT) TABS, Take 1 tablet by mouth 3 (three) times a week., Disp: , Rfl:    Omega 3 1000 MG CAPS, Take 1,000 mg by mouth daily as needed (when not eating vegetables)., Disp: , Rfl:    Garner Nash, DO [facility] 07/01/2021 7:42 PM

## 2021-07-12 ENCOUNTER — Other Ambulatory Visit: Payer: Self-pay

## 2021-07-12 ENCOUNTER — Ambulatory Visit (HOSPITAL_COMMUNITY)
Admission: RE | Admit: 2021-07-12 | Discharge: 2021-07-12 | Disposition: A | Discharge status: Home / Self Care | Payer: Medicare HMO | Source: Ambulatory Visit | Attending: Pulmonary Disease | Admitting: Pulmonary Disease

## 2021-07-12 DIAGNOSIS — J9859 Other diseases of mediastinum, not elsewhere classified: Secondary | ICD-10-CM | POA: Insufficient documentation

## 2021-07-12 LAB — GLUCOSE, CAPILLARY: Glucose-Capillary: 110 mg/dL — ABNORMAL HIGH (ref 70–99)

## 2021-07-12 IMAGING — CT NM PET TUM IMG INITIAL (PI) SKULL BASE T - THIGH
1 of 8 series · 1 of 25 positions shown · non-contrast
Comparison: Multiple exams, including CTA neck [DATE] and CTA
chest [DATE]

CLINICAL DATA: Initial treatment strategy for anterior mediastinal
mass.

EXAM:
NUCLEAR MEDICINE PET SKULL BASE TO THIGH
TECHNIQUE: 8.5 mCi F-18 FDG was injected intravenously. Full-ring PET imaging
was performed from the skull base to thigh after the radiotracer. CT
data was obtained and used for attenuation correction and anatomic
localization.
Fasting blood glucose: 110 mg/dl

[Series 4: ct sk_thigh 5.0 bf37 · axial · 5.0mm · 0.98mm/px · 1 of 243 slices shown]
[im 243/243  brain]
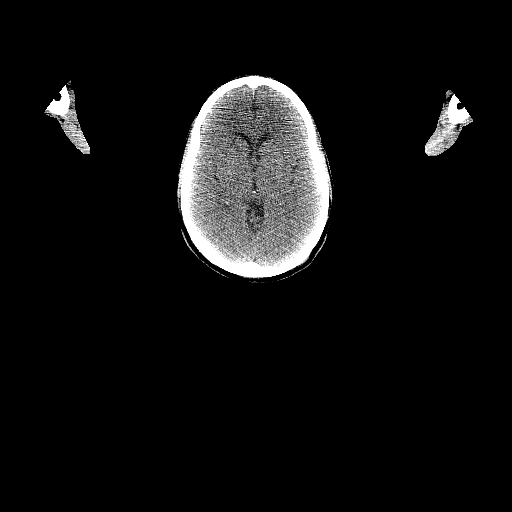

[1 of 25 positions shown; findings below may reference images not displayed]

FINDINGS: Mediastinal blood pool activity: SUV max

Liver activity: SUV max

NECK: Symmetric palatine tonsillar activity with maximum SUV 5.3 on
the left and 5.2 of the right, probably physiologic.

Incidental CT findings: Remote left cerebellar infarct. Bilateral
common carotid atherosclerotic calcifications.

CHEST: [DATE] by 2.7 cm noncalcified anterior mediastinal mass,
noncontrast density 56 Hounsfield units, maximum SUV 4.4. On the
prior chest CT from [DATE] this measured 3.9 by 2.0 cm.

Right hilar lymph node measuring approximately 1.1 cm on image 73
series 4 with some faint calcifications adjacent to its margins,
maximum SUV 4.3. There is also some faintly accentuated right
infrahilar activity with fine calcifications in both infrahilar
regions.

Incidental CT findings: Paraseptal emphysema. Mild scarring or
atelectasis in the lingula. Coronary, aortic arch, and branch vessel
atherosclerotic vascular disease.

ABDOMEN/PELVIS: Spleen normal in size and activity. Faintly
accentuated activity in the right posterolateral peripheral zone of
the prostate gland near the base, maximum SUV 3.9.

Incidental CT findings: Atherosclerosis is present, including
aortoiliac atherosclerotic disease. Infrarenal abdominal aortic
aneurysm 3.4 cm in diameter on image 137 series 4.

SKELETON: No significant abnormal hypermetabolic activity in this
region.

Incidental CT findings: none
IMPRESSION: 1. Anterior mediastinal mass currently measuring 4.2 by 2.7 cm has a
maximum SUV of 4.4, mildly abnormally hypermetabolic. This
corresponds to [HOSPITAL] 4 activity. This lesion has mildly
increased in size since [BE]. This is most likely a thymoma or
lymphoma. Germ-cell tumor is a less likely differential diagnostic
consideration. Thymic carcinoma is substantially less likely given
the slow growth and the SUV less than 5.0.
2. Mildly enlarged right hilar lymph node has mildly accentuated
metabolic activity, and there is also some faintly accentuated right
infrahilar activity. Fine calcifications along the right hilum and
along other nodal structures favoring at least a component of old
granulomatous disease.
3. Mildly accentuated activity in the right posterolateral
peripheral zone of the prostate gland near the base. The appearance
is nonspecific and could be neoplastic or
inflammatory/postinflammatory. Correlate with PSA levels in
determining whether further workup warranted.
4. Infrarenal abdominal aortic aneurysm 3.4 cm in diameter.
Recommend follow-up every 3 years.
Reference: [HOSPITAL] [BE];[DATE].
5. Other imaging findings of potential clinical significance: Aortic
Atherosclerosis ([BE]-[BE]) and Emphysema ([BE]-[BE]). Coronary
atherosclerosis and peripheral atherosclerosis. Remote left
cerebellar infarct.

## 2021-07-12 MED ORDER — FLUDEOXYGLUCOSE F - 18 (FDG) INJECTION
8.2000 | Freq: Once | INTRAVENOUS | Status: AC | PRN
  Administered 2021-07-12: 8.2 via INTRAVENOUS

## 2021-07-13 NOTE — Progress Notes (Deleted)
[address]       Caleb Cardenas,[place] [address]             [phone]                    Caleb Cardenas [facility] Medical Record #[id number] Date of Birth: 02-Jan-1956  Referring: [facility] [place] Primary Cardiologist: None  Chief Complaint:   No chief complaint on file.   History of Present Illness:    Caleb Cardenas 66 y.o. 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   Vertigo     Past Surgical History:  Procedure Laterality Date   KNEE SURGERY     x2  in high school    Family History  Problem Relation Age of Onset   Heart attack Mother    Alzheimer's disease Father    Pancreatic cancer Brother      Social History   Tobacco Use  Smoking Status Every Day   Packs/day: 0.50   Types: Cigarettes   Passive exposure: Never  Smokeless Tobacco Never    Social History   Substance and Sexual Activity  Alcohol Use Yes   Comment: occ     No Known Allergies  Current Outpatient Medications  Medication Sig Dispense Refill   aspirin 325 MG tablet Take 1 tablet (325 mg total) by mouth daily. To be taken totally for 3 months and then stop. 30 tablet 2   atorvastatin (LIPITOR) 80 MG tablet Take 1 tablet (80 mg total) by mouth at bedtime. 90 tablet 4   clopidogrel (PLAVIX) 75 MG tablet Take 1 tablet (75 mg total) by mouth daily. 90 tablet 4   lisinopril (ZESTRIL) 10 MG tablet Take 1 tablet (10 mg total) by mouth at bedtime. 30 tablet 1   metoprolol succinate (TOPROL XL) 25 MG 24 hr tablet Take 1 tablet (25 mg total) by mouth daily. 90 tablet 3   Multiple Vitamin (MULTIVITAMIN ADULT) TABS Take 1 tablet by mouth 3 (three) times a week.     Omega 3 1000 MG CAPS Take 1,000 mg by mouth daily as needed (when not eating vegetables).     No current facility-administered medications for this visit.    ROS  PHYSICAL  EXAMINATION: There were no vitals taken for this visit.  Physical Exam   Diagnostic Studies & Laboratory data:     Recent Radiology Findings:   CARDIAC EVENT MONITOR  Result Date: 06/24/2021 Sinus rhythm Nocturnal bradycardia No afib Nonsustained ventricular tachycardia No sustained arrhythmias Baseline artifact limits interpretation at times  NM PET Image Initial (PI) Skull Base To Thigh (F-18 FDG)  Result Date: 07/13/2021 CLINICAL DATA:  Initial treatment strategy for anterior mediastinal mass. EXAM: NUCLEAR MEDICINE PET SKULL BASE TO THIGH TECHNIQUE: 8.5 mCi F-18 FDG was injected intravenously. Full-ring PET imaging was performed from the skull base to thigh after the radiotracer. CT data was obtained and used for attenuation correction and anatomic localization. Fasting blood glucose: 110 mg/dl COMPARISON:  Multiple exams, including CTA neck 04/19/2021 and CTA chest 10/06/2015 FINDINGS: Mediastinal blood pool activity: SUV max 2.6 Liver activity: SUV max 3.4 NECK: Symmetric palatine tonsillar activity with maximum SUV 5.3 on the left and 5.2 of the right, probably physiologic. Incidental CT findings: Remote left cerebellar infarct. Bilateral common carotid atherosclerotic calcifications. CHEST:  4.2 by 2.7 cm noncalcified anterior mediastinal mass, noncontrast density 56 Hounsfield units, maximum SUV 4.4. On the prior chest CT from 10/06/2015 this measured 3.9 by 2.0 cm. Right hilar lymph node measuring approximately 1.1 cm on image 73 series 4 with some faint calcifications adjacent to its margins, maximum SUV 4.3. There is also some faintly accentuated right infrahilar activity with fine calcifications in both infrahilar regions. Incidental CT findings: Paraseptal emphysema. Mild scarring or atelectasis in the lingula. Coronary, aortic arch, and branch vessel atherosclerotic vascular disease. ABDOMEN/PELVIS: Spleen normal in size and activity. Faintly accentuated activity in the right  posterolateral peripheral zone of the prostate gland near the base, maximum SUV 3.9. Incidental CT findings: Atherosclerosis is present, including aortoiliac atherosclerotic disease. Infrarenal abdominal aortic aneurysm 3.4 cm in diameter on image 137 series 4. SKELETON: No significant abnormal hypermetabolic activity in this region. Incidental CT findings: none IMPRESSION: 1. Anterior mediastinal mass currently measuring 4.2 by 2.7 cm has a maximum SUV of 4.4, mildly abnormally hypermetabolic. This corresponds to Deauville 4 activity. This lesion has mildly increased in size since 2017. 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 Germ-cell tumor is a less likely differential diagnostic consideration. Thymic carcinoma is substantially less likely given the slow growth and the SUV less than 5.0. 2. Mildly enlarged right hilar lymph node has mildly accentuated metabolic activity, and there is also some faintly accentuated right infrahilar activity. Fine calcifications along the right hilum and along other nodal structures favoring at least a component of old granulomatous disease. 3. Mildly accentuated activity in the right posterolateral peripheral zone of the prostate gland near the base. The appearance is nonspecific and could be neoplastic or inflammatory/postinflammatory. Correlate with PSA levels in determining whether further workup warranted. 4. 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 Recommend follow-up every 3 years. Reference: J Am Coll Radiol 7353;29:924-268. 5. Other imaging findings of potential clinical significance: Aortic Atherosclerosis (ICD10-I70.0) and Emphysema (ICD10-J43.9). Coronary atherosclerosis and peripheral atherosclerosis. Remote left cerebellar infarct. Electronically Signed   By: Van Clines M.D.   On: 07/13/2021 12:37       I have independently reviewed the above radiology studies  and reviewed the findings with the patient.   Recent Lab Findings: Lab  Results  Component Value Date   WBC 10.0 04/20/2021   HGB 15.2 04/20/2021   HCT 44.8 04/20/2021   PLT 215 04/20/2021   GLUCOSE 101 (H) 04/20/2021   CHOL 199 04/21/2020   TRIG 621 (HH) 04/21/2020   HDL 25 (L) 04/21/2020   LDLCALC 76 04/21/2020   ALT 21 04/19/2021   AST 23 04/19/2021   NA 137 04/20/2021   K 4.5 04/20/2021   CL 107 04/20/2021   CREATININE 1.48 (H) 04/20/2021   BUN 24 (H) 04/20/2021   CO2 24 04/20/2021   INR 1.0 04/19/2021   HGBA1C 6.0 (H) 04/21/2020       Problem List: ***   Assessment / Plan:   66 year old male with an anterior mediastinal mass.  PET/CT does show avidity within the mass.  There does not appear to be any other avidity elsewhere concerning for lymphoma.  We will order tumor markers.  If these are all negative then we will potentially schedule him for robotic assisted resection.      Lajuana Matte 07/14/2021 9:05 AM

## 2021-07-16 ENCOUNTER — Telehealth: Payer: Self-pay | Admitting: Diagnostic Neuroimaging

## 2021-07-16 ENCOUNTER — Encounter: Payer: Medicare HMO | Admitting: Thoracic Surgery (Cardiothoracic Vascular Surgery)

## 2021-07-16 NOTE — Telephone Encounter (Addendum)
Pt has called to request Dr Leta Baptist fax his dentist with the ok for him to have some dental work done.[facility] [phone]

## 2021-07-19 NOTE — Telephone Encounter (Signed)
Dentist office called to check, I let them know we're waiting on MD signature and it will be faxed back to them. Fax number [phone]

## 2021-07-19 NOTE — Telephone Encounter (Signed)
Letter signed and faxed back, confirmation received.

## 2021-07-19 NOTE — Telephone Encounter (Signed)
Letter printed, on MD desk for signature  

## 2021-07-19 NOTE — Telephone Encounter (Signed)
Pts dentist called, informed her they will need to send [place] a clearance form or letter stating what is being done and if any meds will need to held for Dr Leta Baptist to review and sign. Fax to pod 3 provided.

## 2021-07-19 NOTE — Telephone Encounter (Signed)
Letter for clearance received, will write a letter if dr Leta Baptist agree's. Letter on MD desk.

## 2021-07-19 NOTE — Progress Notes (Signed)
[address]       [place] [address]             [phone]                    Kenyan L Seeling [facility] Medical Record #[id number] Date of Birth: 1955/07/19  Referring: Garner Nash, DO Primary Care: [facility] Primary Cardiologist: None  Chief Complaint:    Chief Complaint  Patient presents with   Mediastinal Mass    Initial surgical consult PET 1/23, PFT 2/2    History of Present Illness:    Caleb Cardenas 66 y.o. male referred for surgical evaluation of an anterior mediastinal mass.  This was found incidentally when he was being worked up in the emergency department for strokelike symptoms.  He was found to have a cerebrovascular event and was placed on Plavix.  He has held this over the last 5 days because he has some scheduled dental work.  Regards to his neurologic symptoms he denies any right now.  But when he was being worked up he did dorsum some double vision, as well as some lightheadedness.  He denies any weakness on one side or the other.  He denies any eyelid drooping, muscle weakness, or respiratory symptoms.  His weight has been stable.  He admits to rare night sweats.       Zubrod Score: At the time of surgery this patients most appropriate activity status/level should be described as: [x]     0    Normal activity, no symptoms []     1    Restricted in physical strenuous activity but ambulatory, able to do out light work []     2    Ambulatory and capable of self care, unable to do work activities, up and about               >50 % of waking hours                              []     3    Only limited self care, in bed greater than 50% of waking hours []     4    Completely disabled, no self care, confined to bed or chair []     5    Moribund   Past Medical History:  Diagnosis Date   Blurred vision    Dizziness    Hypertension    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   Vertigo     Past Surgical History:  Procedure  Laterality Date   KNEE SURGERY     x2  in high school    Family History  Problem Relation Age of Onset   Heart attack Mother    Alzheimer's disease Father    Pancreatic cancer Brother      Social History   Tobacco Use  Smoking Status Every Day   Packs/day: 0.50   Types: Cigarettes   Passive exposure: Never  Smokeless Tobacco Never    Social History   Substance and Sexual Activity  Alcohol Use Yes   Comment: occ     No Known Allergies  Current Outpatient Medications  Medication Sig Dispense Refill   atorvastatin (LIPITOR) 80 MG tablet Take 1 tablet (80 mg total) by mouth at bedtime. 90 tablet 4   lisinopril (ZESTRIL) 10 MG tablet Take 1 tablet (10 mg total) by mouth at  bedtime. 30 tablet 1   metoprolol succinate (TOPROL XL) 25 MG 24 hr tablet Take 1 tablet (25 mg total) by mouth daily. 90 tablet 3   Multiple Vitamin (MULTIVITAMIN ADULT) TABS Take 1 tablet by mouth 3 (three) times a week.     Omega 3 1000 MG CAPS Take 1,000 mg by mouth daily as needed (when not eating vegetables).     clopidogrel (PLAVIX) 75 MG tablet Take 1 tablet (75 mg total) by mouth daily. (Patient not taking: Reported on 07/23/2021) 90 tablet 4   No current facility-administered medications for this visit.    Review of Systems  Constitutional:  Negative for chills, fever, malaise/fatigue and weight loss.  Eyes:  Positive for blurred vision and double vision.  Respiratory:  Negative for shortness of breath.   Cardiovascular:  Negative for chest pain and palpitations.  Neurological:  Positive for dizziness. Negative for focal weakness, seizures, loss of consciousness and weakness.  Psychiatric/Behavioral:  The patient is nervous/anxious.     PHYSICAL EXAMINATION: BP (!) 183/76 (BP Location: Right Arm, Patient Position: Sitting)    Pulse 60    Resp 20    Ht 5\' 10"  (1.778 m)    SpO2 96% Comment: rA   BMI 23.73 kg/m  Physical Exam Constitutional:      General: He is not in acute distress.     Appearance: Normal appearance. He is normal weight. He is not ill-appearing.  HENT:     Head: Normocephalic and atraumatic.  Cardiovascular:     Rate and Rhythm: Normal rate and regular rhythm.     Heart sounds: Murmur heard.  Pulmonary:     Effort: Pulmonary effort is normal. No respiratory distress.     Breath sounds: Normal breath sounds.  Abdominal:     General: Abdomen is flat. There is no distension.  Musculoskeletal:        General: Normal range of motion.     Cervical back: Normal range of motion.  Skin:    General: Skin is warm and dry.  Neurological:     General: No focal deficit present.     Mental Status: He is alert and oriented to person, place, and time.    Diagnostic Studies & Laboratory data:     Recent Radiology Findings:   NM PET Image Initial (PI) Skull Base To Thigh (F-18 FDG)  Result Date: 07/13/2021 CLINICAL DATA:  Initial treatment strategy for anterior mediastinal mass. EXAM: NUCLEAR MEDICINE PET SKULL BASE TO THIGH TECHNIQUE: 8.5 mCi F-18 FDG was injected intravenously. Full-ring PET imaging was performed from the skull base to thigh after the radiotracer. CT data was obtained and used for attenuation correction and anatomic localization. Fasting blood glucose: 110 mg/dl COMPARISON:  Multiple exams, including CTA neck 04/19/2021 and CTA chest 10/06/2015 FINDINGS: Mediastinal blood pool activity: SUV max 2.6 Liver activity: SUV max 3.4 NECK: Symmetric palatine tonsillar activity with maximum SUV 5.3 on the left and 5.2 of the right, probably physiologic. Incidental CT findings: Remote left cerebellar infarct. Bilateral common carotid atherosclerotic calcifications. CHEST: 4.2 by 2.7 cm noncalcified anterior mediastinal mass, noncontrast density 56 Hounsfield units, maximum SUV 4.4. On the prior chest CT from 10/06/2015 this measured 3.9 by 2.0 cm. Right hilar lymph node measuring approximately 1.1 cm on image 73 series 4 with some faint calcifications adjacent to  its margins, maximum SUV 4.3. There is also some faintly accentuated right infrahilar activity with fine calcifications in both infrahilar regions. Incidental CT findings: Paraseptal emphysema. Mild scarring  or atelectasis in the lingula. Coronary, aortic arch, and branch vessel atherosclerotic vascular disease. ABDOMEN/PELVIS: Spleen normal in size and activity. Faintly accentuated activity in the right posterolateral peripheral zone of the prostate gland near the base, maximum SUV 3.9. Incidental CT findings: Atherosclerosis is present, including aortoiliac atherosclerotic disease. Infrarenal abdominal aortic aneurysm 3.4 cm in diameter on image 137 series 4. SKELETON: No significant abnormal hypermetabolic activity in this region. Incidental CT findings: none IMPRESSION: 1. Anterior mediastinal mass currently measuring 4.2 by 2.7 cm has a maximum SUV of 4.4, mildly abnormally hypermetabolic. This corresponds to Deauville 4 activity. This lesion has mildly increased in size since 2017. 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 Germ-cell tumor is a less likely differential diagnostic consideration. Thymic carcinoma is substantially less likely given the slow growth and the SUV less than 5.0. 2. Mildly enlarged right hilar lymph node has mildly accentuated metabolic activity, and there is also some faintly accentuated right infrahilar activity. Fine calcifications along the right hilum and along other nodal structures favoring at least a component of old granulomatous disease. 3. Mildly accentuated activity in the right posterolateral peripheral zone of the prostate gland near the base. The appearance is nonspecific and could be neoplastic or inflammatory/postinflammatory. Correlate with PSA levels in determining whether further workup warranted. 4. 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 Recommend follow-up every 3 years. Reference: J Am Coll Radiol 4010;27:253-664. 5. Other imaging findings of  potential clinical significance: Aortic Atherosclerosis (ICD10-I70.0) and Emphysema (ICD10-J43.9). Coronary atherosclerosis and peripheral atherosclerosis. Remote left cerebellar infarct. Electronically Signed   By: Van Clines M.D.   On: 07/13/2021 12:37       I have independently reviewed the above radiology studies  and reviewed the findings with the patient.   Recent Lab Findings: Lab Results  Component Value Date   WBC 10.0 04/20/2021   HGB 15.2 04/20/2021   HCT 44.8 04/20/2021   PLT 215 04/20/2021   GLUCOSE 101 (H) 04/20/2021   CHOL 199 04/21/2020   TRIG 621 (HH) 04/21/2020   HDL 25 (L) 04/21/2020   LDLCALC 76 04/21/2020   ALT 21 04/19/2021   AST 23 04/19/2021   NA 137 04/20/2021   K 4.5 04/20/2021   CL 107 04/20/2021   CREATININE 1.48 (H) 04/20/2021   BUN 24 (H) 04/20/2021   CO2 24 04/20/2021   INR 1.0 04/19/2021   HGBA1C 6.0 (H) 04/21/2020     PFTs:  - FVC: 79% - FEV1: 84% -DLCO: 84%  Problem List: 4.2 cm anterior mediastinal mass with SUV of 4.4. Enlarged right hilar lymph nodes with mild activity       Assessment / Plan:   65yo male with anterior mediastinal mass.  Mildly increased uptake on PET favoring thymoma vs lymphoma.  Mildly enlarged hight hilar nodes, with mild uptake on PET as well.  No other nodal avidity on PET.  I have ordered a set of tumor markers along with an MRI chest.  I think that it is worth performing an ultrasound-guided biopsy of his hilar lymph nodes for further evaluation for lymphoma.  If all of these are negative then he will need an excisional biopsy of his mediastinal mass.  This will be performed robotically if he is agreeable.      I  spent 40 minutes with  the patient face to face in counseling and coordination of care.    Lajuana Matte 07/23/2021 9:41 AM

## 2021-07-22 ENCOUNTER — Ambulatory Visit (INDEPENDENT_AMBULATORY_CARE_PROVIDER_SITE_OTHER): Payer: Medicare HMO | Admitting: Pulmonary Disease

## 2021-07-22 ENCOUNTER — Other Ambulatory Visit: Payer: Self-pay

## 2021-07-22 ENCOUNTER — Other Ambulatory Visit: Payer: Self-pay | Admitting: *Deleted

## 2021-07-22 DIAGNOSIS — J9859 Other diseases of mediastinum, not elsewhere classified: Secondary | ICD-10-CM

## 2021-07-22 LAB — PULMONARY FUNCTION TEST
DL/VA % pred: 87 %
DL/VA: 3.62 ml/min/mmHg/L
DLCO cor % pred: 84 %
DLCO cor: 22.48 ml/min/mmHg
DLCO unc % pred: 84 %
DLCO unc: 22.48 ml/min/mmHg
FEF 25-75 Post: 3.55 L/sec
FEF 25-75 Pre: 2.88 L/sec
FEF2575-%Change-Post: 23 %
FEF2575-%Pred-Post: 132 %
FEF2575-%Pred-Pre: 107 %
FEV1-%Change-Post: 4 %
FEV1-%Pred-Post: 88 %
FEV1-%Pred-Pre: 84 %
FEV1-Post: 3.02 L
FEV1-Pre: 2.89 L
FEV1FVC-%Change-Post: 2 %
FEV1FVC-%Pred-Pre: 107 %
FEV6-%Change-Post: 2 %
FEV6-%Pred-Post: 85 %
FEV6-%Pred-Pre: 83 %
FEV6-Post: 3.69 L
FEV6-Pre: 3.61 L
FEV6FVC-%Pred-Post: 105 %
FEV6FVC-%Pred-Pre: 105 %
FVC-%Change-Post: 1 %
FVC-%Pred-Post: 80 %
FVC-%Pred-Pre: 79 %
FVC-Post: 3.69 L
FVC-Pre: 3.62 L
Post FEV1/FVC ratio: 82 %
Post FEV6/FVC ratio: 100 %
Pre FEV1/FVC ratio: 80 %
Pre FEV6/FVC Ratio: 100 %
RV % pred: 100 %
RV: 2.39 L
TLC % pred: 89 %
TLC: 6.26 L

## 2021-07-22 NOTE — Progress Notes (Signed)
Full PFT performed today. °

## 2021-07-22 NOTE — Patient Instructions (Signed)
Full PFT performed today. °

## 2021-07-22 NOTE — Progress Notes (Signed)
The proposed treatment discussed in conference is for discussion purpose only and is not a binding recommendation. The patient was not been physically examined, or presented with their treatment options. Therefore, final treatment plans cannot be decided.  

## 2021-07-23 ENCOUNTER — Institutional Professional Consult (permissible substitution): Payer: Medicare HMO | Admitting: Thoracic Surgery (Cardiothoracic Vascular Surgery)

## 2021-07-23 ENCOUNTER — Other Ambulatory Visit: Payer: Self-pay | Admitting: Thoracic Surgery (Cardiothoracic Vascular Surgery)

## 2021-07-23 VITALS — BP 183/76 | HR 60 | Resp 20 | Ht 70.0 in

## 2021-07-23 DIAGNOSIS — J9859 Other diseases of mediastinum, not elsewhere classified: Secondary | ICD-10-CM

## 2021-07-29 ENCOUNTER — Telehealth: Payer: Self-pay | Admitting: Pulmonary Disease

## 2021-07-29 DIAGNOSIS — R599 Enlarged lymph nodes, unspecified: Secondary | ICD-10-CM

## 2021-07-29 NOTE — Telephone Encounter (Signed)
-----   Message from Lajuana Matte, MD sent at 07/23/2021  9:44 AM EST ----- Hey, Thanks for sending this guy.  He has some avidity in his hilar lymph nodes, and I think it is probably worth getting a biopsy of these.  I think the key is ruling out lymphoma.  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  If his work-up is negative for lymphoma and that we will just proceed with an excisional biopsy.  Let me know your thoughts. H

## 2021-07-29 NOTE — Telephone Encounter (Signed)
PCCM:  I called and left a message with Caleb Cardenas to get him scheduled for a biopsy after meeting with Dr. Kipp Brood.   If possible I would like to do the patient's biopsy on 08/06/2021, 7:30 AM slot [facility].  If this time works for the patient we will go ahead and get him set up for this.  The patient is on Plavix and will need to hold the Plavix 5 full days prior to the procedure.  [facility] 07/29/2021 12:05 PM

## 2021-08-02 NOTE — Telephone Encounter (Signed)
Patient has upcoming appt 09/23/2021. He is aware and will keep scheduled appt.  Nothing further needed.

## 2021-08-02 NOTE — Telephone Encounter (Signed)
Has the patient called back yet still waiting to schedule his [place]

## 2021-08-02 NOTE — Telephone Encounter (Signed)
Spoke to patient and relayed below message. He would like to hold off on bx for now, as he is currently in the process of getting dental work.  Routing to Dr. Valeta Harms to make aware.

## 2021-08-08 ENCOUNTER — Other Ambulatory Visit: Payer: Self-pay

## 2021-08-08 ENCOUNTER — Ambulatory Visit
Admission: RE | Admit: 2021-08-08 | Discharge: 2021-08-08 | Disposition: A | Discharge status: Home / Self Care | Payer: Medicare HMO | Source: Ambulatory Visit | Attending: Thoracic Surgery (Cardiothoracic Vascular Surgery) | Admitting: Thoracic Surgery (Cardiothoracic Vascular Surgery)

## 2021-08-08 DIAGNOSIS — J9859 Other diseases of mediastinum, not elsewhere classified: Secondary | ICD-10-CM

## 2021-08-08 IMAGING — MR MR CHEST MEDIASTINUM WO/W CM
18 series · 18 of 18 positions shown · IV contrast (gadolinium)
Comparison: PET-CT [DATE].  Chest CTA [DATE].

CLINICAL DATA: 65-year-old male with history of anterior
mediastinal mass. Follow-up study.

EXAM:
MRI CHEST WITHOUT AND WITH CONTRAST
TECHNIQUE: Multiplanar multisequence MR imaging was performed of the thorax
before and after the administration of IV gadolinium.
CONTRAST:  15mL MULTIHANCE GADOBENATE DIMEGLUMINE 529 MG/ML IV SOLN

[Series 3: cor haste · coronal · 4.0mm · 0.68mm/px · 1 of 32 slices shown]
[im 1/32]
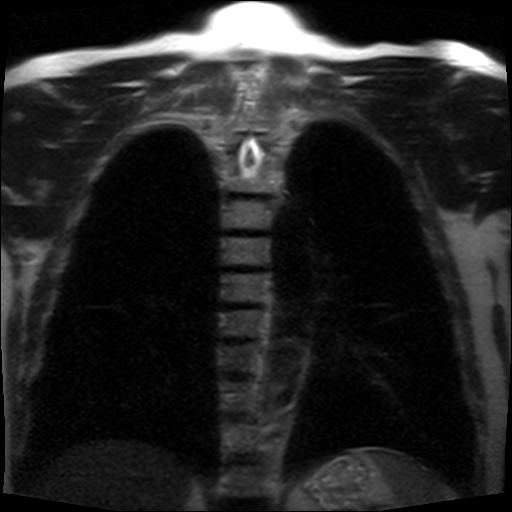

[Series 4: T2 · coronal · 4.0mm · 0.74mm/px · 1 of 29 slices shown]
[im 1/29]
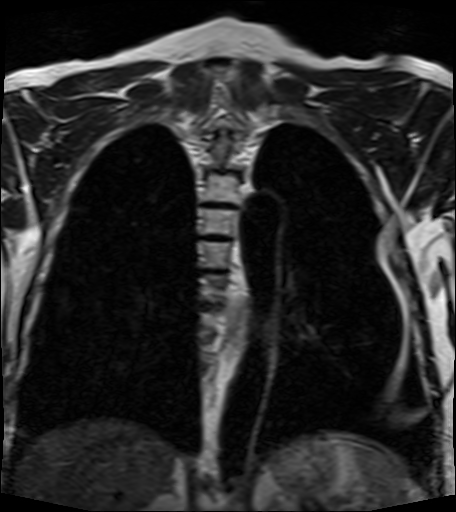

[Series 5: bSSFP · coronal · 4.0mm · 0.70mm/px · 1 of 27 slices shown (1 of 2)]
[im 1/27]
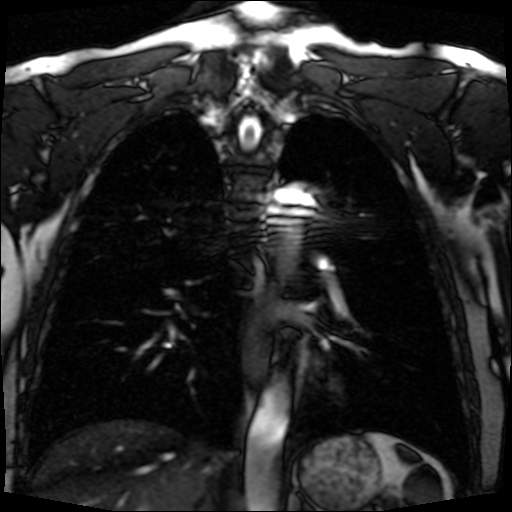

[Series 6: T1 · coronal · 5.5mm · 0.68mm/px · 1 of 27 slices shown (1 of 2)]
[im 1/27]
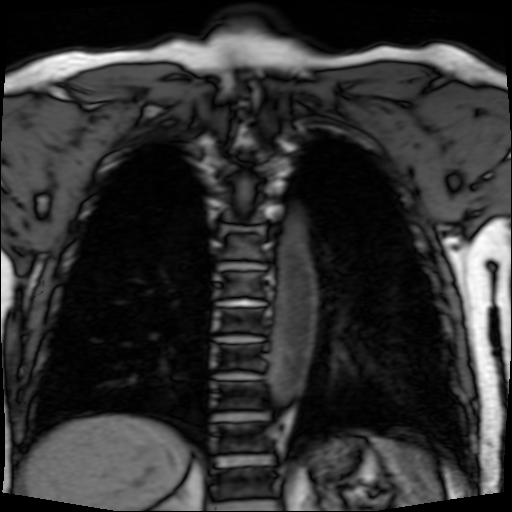

[Series 7: axial haste · axial · 6.0mm · 0.68mm/px · 1 of 47 slices shown]
[im 1/47]
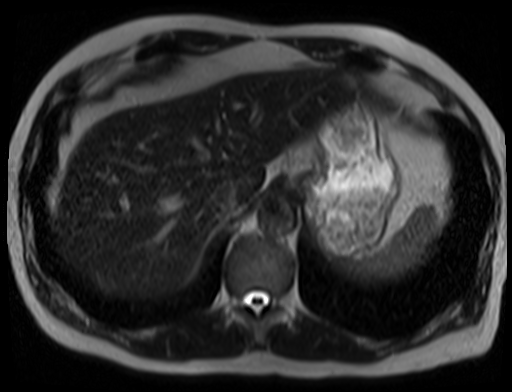

[Series 8: DWI · axial · 6.0mm · 1.82mm/px · 1 of 128 slices shown]
[im 1/128]
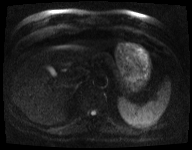

[Series 9: axial dwi_adc · axial · 6.0mm · 1.82mm/px · 1 of 43 slices shown]
[im 1/43]
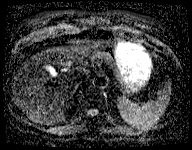

[Series 10: T1 · axial · 6.0mm · 0.68mm/px · 1 of 98 slices shown (2 of 2)]
[im 1/98]
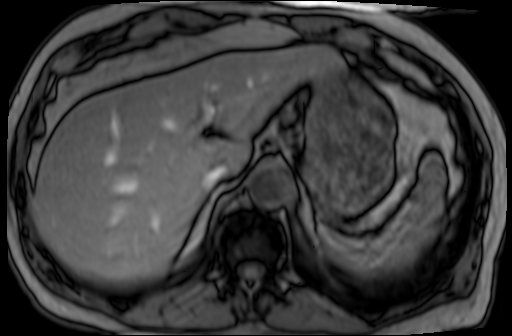

[Series 11: bSSFP · axial · 4.0mm · 0.72mm/px · 1 of 74 slices shown (2 of 2)]
[im 1/74]
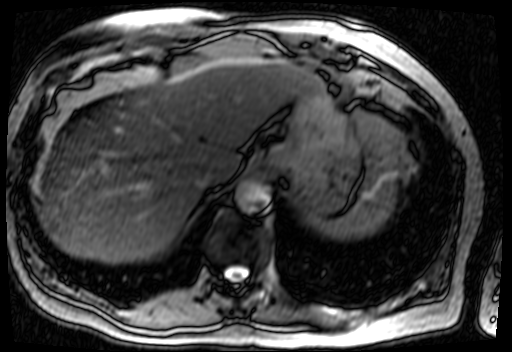

[Series 12: t1_tse_ax bh -black · axial · 7.0mm · 0.74mm/px · 1 of 39 slices shown]
[im 1/39]
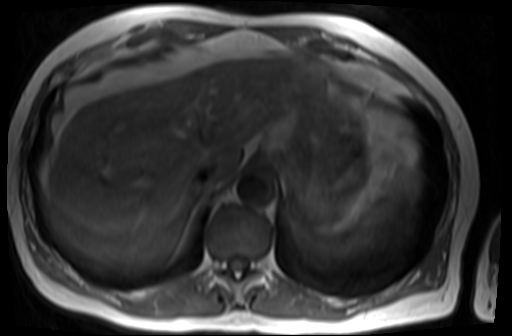

[Series 13: T1 dynamic · axial · non-contrast · 3.0mm · 0.74mm/px · 1 of 104 slices shown]
[im 1/104]
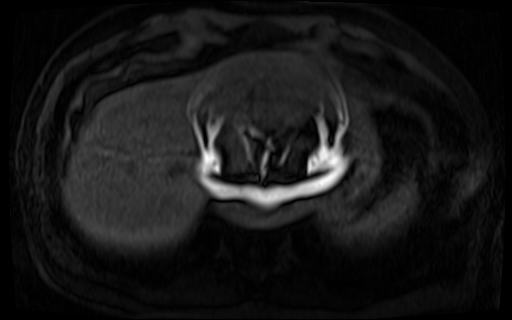

[Series 14: T1 dynamic post-contrast · axial · 3.0mm · 0.74mm/px · 1 of 104 slices shown (1 of 6)]
[im 1/104]
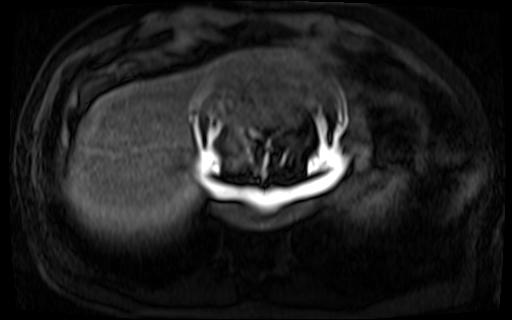

[Series 15: T1 dynamic post-contrast · axial · 3.0mm · 0.74mm/px · 1 of 103 slices shown (2 of 6)]
[im 1/103]
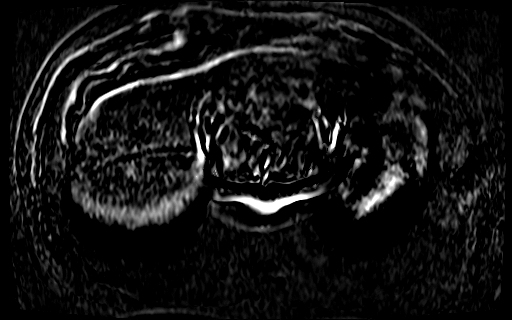

[Series 16: T1 dynamic post-contrast · axial · 3.0mm · 0.74mm/px · 1 of 104 slices shown (3 of 6)]
[im 1/104]
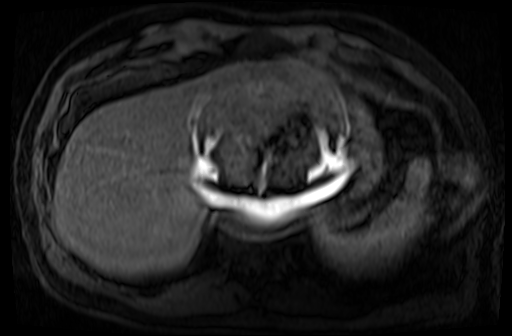

[Series 17: T1 dynamic post-contrast · axial · 3.0mm · 0.74mm/px · 1 of 104 slices shown (4 of 6)]
[im 1/104]
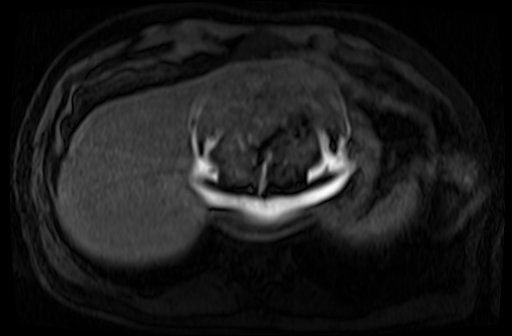

[Series 18: T1 post-contrast · coronal · 5.5mm · 0.68mm/px · 1 of 30 slices shown]
[im 1/30]
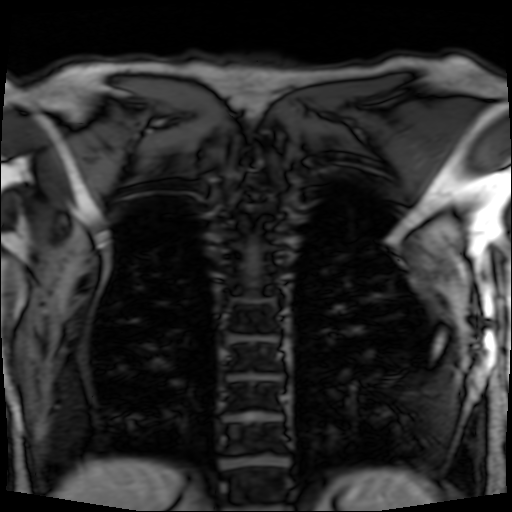

[Series 19: T1 dynamic post-contrast · axial · 3.0mm · 0.74mm/px · 1 of 104 slices shown (5 of 6)]
[im 1/104]
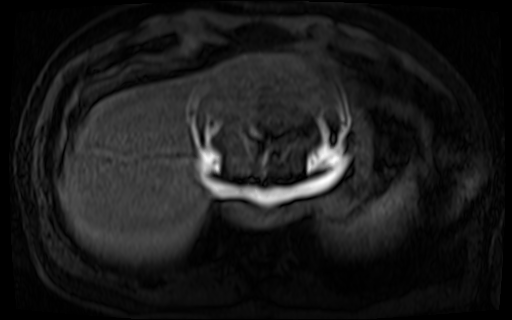

[Series 20: T1 dynamic post-contrast · axial · 3.0mm · 0.74mm/px · 1 of 104 slices shown (6 of 6)]
[im 1/104]
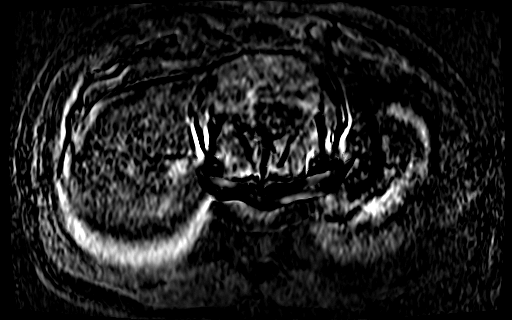

[18 of 18 positions shown; findings below may reference images not displayed]

FINDINGS: Again noted is a smoothly marginated lesion in the anterior
mediastinum which measures 4.1 x 2.6 x 3.0 cm (axial image 25 of
series 7 and coronal image 21 of series 3). This lesion is nearly
isointense to muscle on T1 weighted images, generally mildly
hyperintense to muscle on T2 weighted images, although there also
some more focal internal T2 hyperintense regions which do not
enhance, indicative of small cystic areas. The entire lesion
demonstrates some low-level enhancement on post gadolinium images.
No definite internal macroscopic lipid identified. No definite
diffusion restriction. This lesion is well separate from the thyroid
gland, and does not have a morphology suggestive of an enlarged
lymph node.
IMPRESSION: 1. Smoothly marginated 4.1 x 2.6 x 3.0 cm anterior mediastinal mass
which has indeterminate imaging characteristics, but not suggestive
of a lesion of either nodal or thyroid origin. However, this lesion
has been relatively stable in size dating back to prior study from
[DATE], strongly suggestive of a benign etiology, making a
germ-cell tumor unlikely. Accordingly, primary differential
considerations are of thymic etiology, and the lesion is favored to
represent a benign thymic epithelial tumor, likely a benign thymoma.
Another differential consideration would include a thymic carcinoid
tumor.

## 2021-08-08 MED ORDER — GADOBENATE DIMEGLUMINE 529 MG/ML IV SOLN
15.0000 mL | Freq: Once | INTRAVENOUS | Status: AC | PRN
  Administered 2021-08-08: 15 mL via INTRAVENOUS

## 2021-08-12 ENCOUNTER — Other Ambulatory Visit: Payer: Self-pay | Admitting: Thoracic Surgery (Cardiothoracic Vascular Surgery)

## 2021-08-12 DIAGNOSIS — J9859 Other diseases of mediastinum, not elsewhere classified: Secondary | ICD-10-CM

## 2021-09-23 ENCOUNTER — Ambulatory Visit: Payer: Medicare HMO | Admitting: Pulmonary Disease

## 2021-09-24 ENCOUNTER — Ambulatory Visit: Payer: Medicare HMO | Admitting: Cardiology

## 2021-11-29 ENCOUNTER — Telehealth: Payer: Self-pay

## 2021-11-29 NOTE — Telephone Encounter (Signed)
Called patient , LVM requesting call back to discuss FU. Advised if he is doing well he can do a VV. Left #.

## 2021-11-29 NOTE — Telephone Encounter (Signed)
Pt called and cancelled upcoming appt. Unable to find anything sooner then November in Dr. Leta Baptist sch. Please work pt back. Pt can be reached at [phone].

## 2021-11-30 ENCOUNTER — Encounter: Payer: Self-pay | Admitting: Diagnostic Neuroimaging

## 2021-11-30 ENCOUNTER — Ambulatory Visit: Payer: Medicare HMO | Admitting: Diagnostic Neuroimaging

## 2021-11-30 NOTE — Telephone Encounter (Signed)
Patient has appointment this afternoon.

## 2021-12-01 ENCOUNTER — Ambulatory Visit: Payer: Medicare HMO | Admitting: Diagnostic Neuroimaging

## 2021-12-02 ENCOUNTER — Ambulatory Visit: Payer: Medicare HMO | Admitting: Cardiology

## 2022-02-27 NOTE — Progress Notes (Unsigned)
Electrophysiology Office Note:    Date:  02/27/2022   ID:  Caleb Cardenas, DOB [birth year], MRN [id number]  PCP:  Center, [place] Cardiologist:  None  [facility] Electrophysiologist:  Thompson Grayer, MD   Referring MD: Center, [facility]   Chief Complaint: Cryptogenic stroke  History of Present Illness:    Caleb Cardenas is a 66 y.o. male who presents for an evaluation of cryptogenic stroke at the request of Dr Erlinda Hong. Their medical history includes hypertension, stroke, thymoma.  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      Past Medical History:  Diagnosis Date   Blurred vision    Dizziness    Hypertension    Stroke (cerebrum) ([place]) 03/2020   Vertigo     Past Surgical History:  Procedure Laterality Date   KNEE SURGERY     x2  in high school    Current Medications: No outpatient medications have been marked as taking for the 02/28/22 encounter (Appointment) with Vickie Epley, MD.     Allergies:   Patient has no known allergies.   Social History   Socioeconomic History   Marital status: Divorced    Spouse name: Not on file   Number of children: 1   Years of education: Not on file   Highest education level: Some college, no degree  Occupational History    Comment: house flipping, painting  Tobacco Use   Smoking status: Every Day    Packs/day: 0.50    Types: Cigarettes    Passive exposure: Never   Smokeless tobacco: Never  Vaping Use   Vaping Use: Never used  Substance and Sexual Activity   Alcohol use: Yes    Comment: occ   Drug use: No   Sexual activity: Yes  Other Topics Concern   Not on file  Social History Narrative   Lives alone   Caffeine- sodas 2-3 a day   Social Determinants of Health   Financial Resource Strain: Not on file   Food Insecurity: Not on file  Transportation Needs: Not on file  Physical Activity: Not on file  Stress: Not on file  Social Connections: Not on file     Family History: The patient's family history includes Alzheimer's disease in his father; Heart attack in his mother; Pancreatic cancer in his brother.  ROS:   Please see the history of present illness.    All other systems reviewed and are negative.  EKGs/Labs/Other Studies Reviewed:    The following studies were reviewed today:  June 24, 2021 Holter No atrial fibrillation  April 20, 2021 echo EF 55% RV function normal Mild MR    Recent Labs: 04/19/2021: ALT 21 04/20/2021: BUN 24; Creatinine, Ser 1.48; Hemoglobin 15.2; Platelets 215; Potassium 4.5; Sodium 137  Recent Lipid Panel    Component Value Date/Time   CHOL 199 04/21/2020 1418   TRIG 621 (HH) 04/21/2020 1418   HDL 25 (L) 04/21/2020 1418   CHOLHDL 8.0 (H) 04/21/2020 1418   LDLCALC 76 04/21/2020 1418    Physical Exam:    VS:  There were no vitals taken for this visit.    Wt Readings from Last 3 Encounters:  07/01/21 165 lb 6.4 oz (75 kg)  06/22/21 162 lb 12.8 oz (73.8 kg)  05/25/21 163 lb (73.9 kg)     GEN: ***  Well nourished, well developed in no acute distress HEENT: Normal NECK: No JVD; No carotid bruits LYMPHATICS: No lymphadenopathy CARDIAC: ***RRR, no murmurs, rubs, gallops RESPIRATORY:  Clear to auscultation without rales, wheezing or rhonchi  ABDOMEN: Soft, non-tender, non-distended MUSCULOSKELETAL:  No edema; No deformity  SKIN: Warm and dry NEUROLOGIC:  Alert and oriented x 3 PSYCHIATRIC:  Normal affect       ASSESSMENT:    1. Cryptogenic stroke (HCC)    PLAN:    In order of problems listed above:  #Cryptogenic Stroke Pathophysiology of cryptogenic stroke was discussed in detail during today's clinic appointment. I discussed the role of loop recorder monitoring in patients who have suffered a CVA/TIA. There has been no  evidence of AF thus far in the patient's evaluation. Loop recorder monitors were discussed in detail including the implant procedure and its risks. I discussed the monthly monitoring costs associated with loop recorder monitoring. The patient would like to proceed with ILR implant.         Total time spent with patient today *** minutes. This includes reviewing records, evaluating the patient and coordinating care.  Medication Adjustments/Labs and Tests Ordered: Current medicines are reviewed at length with the patient today.  Concerns regarding medicines are outlined above.  No orders of the defined types were placed in this encounter.  No orders of the defined types were placed in this encounter.    Signed, Hilton Cork. Quentin Ore, MD, [facility], [facility] 02/27/2022 8:35 PM    Electrophysiology [facility]  ------------------  SURGEON:  Vickie Epley, MD     PREPROCEDURE DIAGNOSIS:  {LOOPDIAGNOSES:27737::"Cryptogenic stroke"}    POSTPROCEDURE DIAGNOSIS: {LOOPDIAGNOSES:27737::"Cryptogenic stroke"}     PROCEDURES:   1. Implantable loop recorder implantation    INTRODUCTION:  Caleb Cardenas presents with a history of {LOOPDIAGNOSESlowercase:27738::"cryptogenic stroke"} The costs of loop recorder monitoring have been discussed with the patient.    DESCRIPTION OF PROCEDURE:  Informed written consent was obtained.  The patient required no sedation for the procedure today.  Mapping over the patient's chest was performed to identify the area where electrograms were most prominent for ILR recording.  This area was found to be the left parasternal region over the 4th intercostal space. The patients left chest was therefore prepped and draped in the usual sterile fashion. The skin overlying the left parasternal region was infiltrated with lidocaine for local analgesia.  A 0.5-cm incision was made over the left parasternal region over the 3rd intercostal space.  A  subcutaneous ILR pocket was fashioned using a combination of sharp and blunt dissection.  A {LOOPLIST:27736} implantable loop recorder was then placed into the pocket  R waves were very prominent and measured >0.47m.  Steri- Strips and a sterile dressing were then applied.  There were no early apparent complications.     CONCLUSIONS:   1. Successful implantation of a implantable loop recorder for {LOOPDIAGNOSES:27737::"Cryptogenic stroke"}  2. No early apparent complications.   CLysbeth GalasT. LQuentin Ore MD, FR[facility]White [facility] CenterCardiac Electrophysiology

## 2022-02-28 ENCOUNTER — Ambulatory Visit: Payer: Medicare HMO | Attending: Cardiology | Admitting: Cardiology

## 2022-02-28 ENCOUNTER — Encounter: Payer: Self-pay | Admitting: Cardiology

## 2022-02-28 VITALS — BP 148/70 | HR 55 | Ht 70.0 in | Wt 166.0 lb

## 2022-02-28 DIAGNOSIS — I639 Cerebral infarction, unspecified: Secondary | ICD-10-CM | POA: Diagnosis not present

## 2022-02-28 NOTE — Progress Notes (Signed)
Electrophysiology Office Note:    Date:  02/28/2022   ID:  Caleb Cardenas, DOB 09-06-1955, MRN [id number]  PCP:  Center, [place] Cardiologist:  None  [facility] Electrophysiologist:  Vickie Epley, MD   Referring MD: Center, [facility]   Chief Complaint: Cryptogenic stroke  History of Present Illness:    Caleb Cardenas is a 66 y.o. male who presents for an evaluation cryptogenic stroke at the request of Dr Erlinda Hong. Their medical history includes hypertension, stroke, thymoma.   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  Today, he appears well. After discussion he is amenable to proceeding with the loop recorder implant.  He denies any palpitations, chest pain, shortness of breath, or peripheral edema. No lightheadedness, headaches, syncope, orthopnea, or PND.     Past Medical History:  Diagnosis Date   Blurred vision    Dizziness    Hypertension    Stroke (cerebrum) ([place]) 03/2020   Vertigo     Past Surgical History:  Procedure Laterality Date   KNEE SURGERY     x2  in high school    Current Medications: Current Meds  Medication Sig   clopidogrel (PLAVIX) 75 MG tablet Take 1 tablet (75 mg total) by mouth daily.   lisinopril (ZESTRIL) 10 MG tablet Take 1 tablet (10 mg total) by mouth at bedtime.   metoprolol succinate (TOPROL XL) 25 MG 24 hr tablet Take 1 tablet (25 mg total) by mouth daily.   Multiple Vitamin (MULTIVITAMIN ADULT) TABS Take 1 tablet by mouth 3 (three) times a week.   Omega 3 1000 MG CAPS Take 1,000 mg by mouth daily as needed (when not eating vegetables).     Allergies:   Patient has no known allergies.   Social History   Socioeconomic History   Marital status: Divorced    Spouse name: Not on file   Number of children: 1   Years of  education: Not on file   Highest education level: Some college, no degree  Occupational History    Comment: house flipping, painting  Tobacco Use   Smoking status: Every Day    Packs/day: 0.50    Types: Cigarettes    Passive exposure: Never   Smokeless tobacco: Never  Vaping Use   Vaping Use: Never used  Substance and Sexual Activity   Alcohol use: Yes    Comment: occ   Drug use: No   Sexual activity: Yes  Other Topics Concern   Not on file  Social History Narrative   Lives alone   Caffeine- sodas 2-3 a day   Social Determinants of Health   Financial Resource Strain: Not on file  Food Insecurity: Not on file  Transportation Needs: Not on file  Physical Activity: Not on file  Stress: Not on file  Social Connections: Not on file     Family History: The patient's family history includes Alzheimer's disease in his father; Heart attack in his mother; Pancreatic cancer in his brother.  ROS:   Please see the history of present illness.    All other systems reviewed and are negative.  EKGs/Labs/Other Studies Reviewed:    The following studies were reviewed today:  June 24, 2021 Holter No atrial fibrillation   April 20, 2021 echo EF 55% RV function normal Mild MR  EKG:   EKG is  personally reviewed.  02/28/2022:  EKG was not ordered.    Recent Labs: 04/19/2021: ALT 21 04/20/2021: BUN 24; Creatinine, Ser 1.48; Hemoglobin 15.2; Platelets 215; Potassium 4.5; Sodium 137   Recent Lipid Panel    Component Value Date/Time   CHOL 199 04/21/2020 1418   TRIG 621 (HH) 04/21/2020 1418   HDL 25 (L) 04/21/2020 1418   CHOLHDL 8.0 (H) 04/21/2020 1418   LDLCALC 76 04/21/2020 1418    Physical Exam:    VS:  BP (!) 148/70   Pulse (!) 55   Ht '5\' 10"'$  (1.778 m)   Wt 166 lb (75.3 kg)   SpO2 97%   BMI 23.82 kg/m     Wt Readings from Last 3 Encounters:  02/28/22 166 lb (75.3 kg)  07/01/21 165 lb 6.4 oz (75 kg)  06/22/21 162 lb 12.8 oz (73.8 kg)     GEN: Well  nourished, well developed in no acute distress HEENT: Normal NECK: No JVD; No carotid bruits LYMPHATICS: No lymphadenopathy CARDIAC: RRR, no murmurs, rubs, gallops RESPIRATORY:  Clear to auscultation without rales, wheezing or rhonchi  ABDOMEN: Soft, non-tender, non-distended MUSCULOSKELETAL:  No edema; No deformity  SKIN: Warm and dry NEUROLOGIC:  Alert and oriented x 3 PSYCHIATRIC:  Normal affect       ASSESSMENT:    1. Cryptogenic stroke (HCC)    PLAN:    In order of problems listed above:  #Cryptogenic Stroke Pathophysiology of cryptogenic stroke was discussed in detail during today's clinic appointment. I discussed the role of loop recorder monitoring in patients who have suffered a CVA/TIA. There has been no evidence of AF thus far in the patient's evaluation. Loop recorder monitors were discussed in detail including the implant procedure and its risks. I discussed the monthly monitoring costs associated with loop recorder monitoring. The patient would like to proceed with ILR implant.   Medication Adjustments/Labs and Tests Ordered: Current medicines are reviewed at length with the patient today.  Concerns regarding medicines are outlined above.  No orders of the defined types were placed in this encounter.  No orders of the defined types were placed in this encounter.   I,Mathew Stumpf,acting as a Education administrator for Vickie Epley, MD.,have documented all relevant documentation on the behalf of Vickie Epley, MD,as directed by  Vickie Epley, MD while in the presence of Vickie Epley, MD.  I, Vickie Epley, MD, have reviewed all documentation for this visit. The documentation on 02/28/22 for the exam, diagnosis, procedures, and orders are all accurate and complete.   Signed, Hilton Cork. Quentin Ore, MD, [facility], [facility] 02/28/2022 9:25 AM    Electrophysiology [facility]  ------------------   SURGEON:  Vickie Epley, MD      PREPROCEDURE DIAGNOSIS:  Cryptogenic stroke    POSTPROCEDURE DIAGNOSIS: Cryptogenic stroke     PROCEDURES:   1. Implantable loop recorder implantation    INTRODUCTION:  Caleb Cardenas presents with a history of cryptogenic stroke The costs of loop recorder monitoring have been discussed with the patient.    DESCRIPTION OF PROCEDURE:  Informed written consent was obtained.  The patient required no sedation for the procedure today.  Mapping over the patient's chest was performed to identify the area where electrograms were most prominent for ILR recording.  This area was found to be the left parasternal region over the 4th intercostal space. The patients left chest was therefore prepped and draped in the usual sterile fashion. The skin overlying the  left parasternal region was infiltrated with lidocaine for local analgesia.  A 0.5-cm incision was made over the left parasternal region over the 3rd intercostal space.  A subcutaneous ILR pocket was fashioned using a combination of sharp and blunt dissection.  A [employer]onic Reveal LINQ [id number])implantable loop recorder was then placed into the pocket  R waves were very prominent and measured >0.24m.  Steri- Strips and a sterile dressing were then applied.  There were no early apparent complications.     CONCLUSIONS:   1. Successful implantation of a implantable loop recorder for Cryptogenic stroke  2. No early apparent complications.    CLysbeth GalasT. LQuentin Ore MD, F[facility] FMedical Arts HospitalCardiac Electrophysiology

## 2022-02-28 NOTE — Patient Instructions (Signed)
Medication Instructions:  Your physician recommends that you continue on your current medications as directed. Please refer to the Current Medication list given to you today.  Labwork: None ordered.  Testing/Procedures: None ordered.  Follow-Up:  Your physician wants you to follow-up in: As needed with Dr. Quentin Ore in person. He will follow your device remotely.    Implantable Loop Recorder Placement, Care After This sheet gives you information about how to care for yourself after your procedure. Your health care provider may also give you more specific instructions. If you have problems or questions, contact your health care provider. What can I expect after the procedure? After the procedure, it is common to have: Soreness or discomfort near the incision. Some swelling or bruising near the incision.  Follow these instructions at home: Incision care  Monitor your cardiac device site for redness, swelling, and drainage. Call the device clinic at [phone] if you experience these symptoms or fever/chills.  Keep the large square bandage on your site for 24 hours and then you may remove it yourself. Keep the steri-strips underneath in place.   You may shower after 72 hours / 3 days from your procedure with the steri-strips in place. They will usually fall off on their own, or may be removed after 10 days. Pat dry.   Avoid lotions, ointments, or perfumes over your incision until it is well-healed.  Please do not submerge in water until your site is completely healed.   Your device is MRI compatible.   Remote monitoring is used to monitor your cardiac device from home. This monitoring is scheduled every month by our office. It allows [place] to keep an eye on the function of your device to ensure it is working properly.  If your wound site starts to bleed apply pressure.    For help with the monitor please call [employer] Monitor Support Specialist directly at [phone].    If you  have any questions/concerns please call the device clinic at [phone].  Activity  Return to your normal activities.  General instructions Follow instructions from your health care provider about how to manage your implantable loop recorder and transmit the information. Learn how to activate a recording if this is necessary for your type of device. You may go through a metal detection gate, and you may let someone hold a metal detector over your chest. Show your ID card if needed. Do not have an MRI unless you check with your health care provider first. Take over-the-counter and prescription medicines only as told by your health care provider. Keep all follow-up visits as told by your health care provider. This is important. Contact a health care provider if: You have redness, swelling, or pain around your incision. You have a fever. You have pain that is not relieved by your pain medicine. You have triggered your device because of fainting (syncope) or because of a heartbeat that feels like it is racing, slow, fluttering, or skipping (palpitations). Get help right away if you have: Chest pain. Difficulty breathing. Summary After the procedure, it is common to have soreness or discomfort near the incision. Change your dressing as told by your health care provider. Follow instructions from your health care provider about how to manage your implantable loop recorder and transmit the information. Keep all follow-up visits as told by your health care provider. This is important. This information is not intended to replace advice given to you by your health care provider. Make sure you discuss any questions you  have with your health care provider. Document Released: 05/18/2015 Document Revised: 07/22/2017 Document Reviewed: 07/22/2017 Elsevier Patient Education  2020 [employer].

## 2022-03-14 ENCOUNTER — Encounter: Payer: Self-pay | Admitting: Pulmonary Disease

## 2022-03-14 ENCOUNTER — Ambulatory Visit: Payer: Medicare HMO | Admitting: Pulmonary Disease

## 2022-03-14 VITALS — BP 140/70 | HR 56 | Ht 70.0 in | Wt 165.0 lb

## 2022-03-14 DIAGNOSIS — R599 Enlarged lymph nodes, unspecified: Secondary | ICD-10-CM

## 2022-03-14 DIAGNOSIS — J9859 Other diseases of mediastinum, not elsewhere classified: Secondary | ICD-10-CM

## 2022-03-14 NOTE — Patient Instructions (Signed)
Thank you for visiting Dr. Valeta Harms at [facility]. Today we recommend the following:  Orders Placed This Encounter  Procedures   CT Chest Wo Contrast   Follow up with [place] after CT chest complete.   Return in about 2 weeks (around 03/28/2022) for with APP.    Please do your part to reduce the spread of COVID-19.

## 2022-03-14 NOTE — Progress Notes (Signed)
Synopsis: Referred in January 2023 for anterior mediastinal mass by [facility]  Subjective:   PATIENT ID: Caleb Cardenas GENDER: male DOB: 1956-04-22, MRN: [id number]  Chief Complaint  Patient presents with   Follow-up    This is a 66 year old gentleman that was seen for strokelike symptoms in the emergency department in October.  Work-up at the time revealed CT of the head neck which caught a portion of the anterior mediastinum.  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  Patient was referred today for evaluation.  He is a current smoker and has been a smoker for a long time.  Currently at about half a pack per day.  From respiratory standpoint has no symptoms at this time.  Denies fevers chills weight loss night sweats.  OV 03/14/2022:Patient was initially seen by me in January 2023.  He was referred to cardiothoracic surgery for evaluation of anterior mediastinal mass.  Patient had mildly increased uptake in the PET scan favoring a thymoma versus lymphoma.  He had mildly enlarged right hilar nodes.  He was recommended for bronchoscopy and needle biopsy of these hilar nodes.  However failed to follow-up with me.  We contacted him in an attempt to have follow-up in April.  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  Possibly a thymic origin to include either benign thymoma or been benign thymic carcinoid.  It looks relatively stable since the 2017 image.  Patient unfortunately is a current smoker.  He smoked 1 pack a day for the past 40 years.    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   Vertigo      Family History  Problem Relation Age of Onset   Heart attack Mother    Alzheimer's disease Father    Pancreatic cancer Brother      Past  Surgical History:  Procedure Laterality Date   KNEE SURGERY     x2  in high school    Social History   Socioeconomic History   Marital status: Divorced    Spouse name: Not on file   Number of children: 1   Years of education: Not on file   Highest education level: Some college, no degree  Occupational History    Comment: house flipping, painting  Tobacco Use   Smoking status: Every Day    Packs/day: 0.50    Types: Cigarettes    Passive exposure: Never   Smokeless tobacco: Never   Tobacco comments:    Smoking 15-20 cigs a day. 03/14/2022 Tay  Vaping Use   Vaping Use: Never used  Substance and Sexual Activity   Alcohol use: Yes    Comment: occ   Drug use: No   Sexual activity: Yes  Other Topics Concern   Not on file  Social History Narrative   Lives alone   Caffeine- sodas 2-3 a day   Social Determinants of Health   Financial Resource Strain: Not on file  Food Insecurity: Not on file  Transportation Needs: Not on file  Physical Activity: Not on file  Stress: Not on file  Social Connections: Not on file  Intimate Partner Violence: Not on file     No Known Allergies   Outpatient Medications Prior to Visit  Medication Sig Dispense Refill   atorvastatin (LIPITOR) 80 MG tablet Take 1 tablet (80 mg total) by mouth at bedtime. 90 tablet 4   clopidogrel (PLAVIX) 75 MG tablet Take 1 tablet (75 mg total) by mouth daily. 90 tablet 4   lisinopril (ZESTRIL) 10 MG tablet Take 1 tablet (10 mg total) by mouth at bedtime. 30 tablet 1   metoprolol succinate (TOPROL XL) 25 MG 24 hr tablet Take 1 tablet (25 mg total) by mouth daily. 90 tablet 3   Multiple Vitamin (MULTIVITAMIN ADULT) TABS Take 1 tablet by mouth 3 (three) times a week.     Omega 3 1000 MG CAPS Take 1,000 mg by mouth daily as needed (when not eating vegetables).     No facility-administered medications prior to visit.    Review of Systems  Constitutional:  Negative for chills, fever, malaise/fatigue and weight  loss.  HENT:  Negative for hearing loss, sore throat and tinnitus.   Eyes:  Negative for blurred vision and double vision.  Respiratory:  Positive for cough. Negative for hemoptysis, sputum production, shortness of breath, wheezing and stridor.   Cardiovascular:  Negative for chest pain, palpitations, orthopnea, leg swelling and PND.  Gastrointestinal:  Negative for abdominal pain, constipation, diarrhea, heartburn, nausea and vomiting.  Genitourinary:  Negative for dysuria, hematuria and urgency.  Musculoskeletal:  Negative for joint pain and myalgias.  Skin:  Negative for itching and rash.  Neurological:  Negative for dizziness, tingling, weakness and headaches.  Endo/Heme/Allergies:  Negative for environmental allergies. Does not bruise/bleed easily.  Psychiatric/Behavioral:  Negative for depression. The patient is not nervous/anxious and does not have insomnia.   All other systems reviewed and are negative.    Objective:  Physical Exam Vitals reviewed.  Constitutional:      General: He is not in acute distress.    Appearance: He is well-developed.  HENT:     Head: Normocephalic and atraumatic.  Eyes:     General: No scleral icterus.    Conjunctiva/sclera: Conjunctivae normal.     Pupils: Pupils are equal, round, and reactive to light.  Neck:     Vascular: No JVD.     Trachea: No tracheal deviation.  Cardiovascular:     Rate and Rhythm: Normal rate and regular rhythm.     Heart sounds: Normal heart sounds. No murmur heard. Pulmonary:     Effort: Pulmonary effort is normal. No tachypnea, accessory muscle usage or respiratory distress.     Breath sounds: No stridor. No wheezing, rhonchi or rales.  Abdominal:     General: There is no distension.     Palpations: Abdomen is soft.     Tenderness: There is no abdominal tenderness.  Musculoskeletal:        General: No tenderness.     Cervical back: Neck supple.  Lymphadenopathy:     Cervical: No cervical adenopathy.  Skin:     General: Skin is warm and dry.     Capillary Refill: Capillary refill takes less than 2 seconds.     Findings: No rash.  Neurological:     Mental Status: He is alert and oriented to person, place, and time.  Psychiatric:        Behavior: Behavior normal.      Vitals:   03/14/22 1618  BP: (!) 140/70  Pulse: (!) 56  SpO2: 96%  Weight: 165 lb (74.8 kg)  Height: '5\' 10"'$  (1.778 m)   96% on RA BMI Readings from Last 3 Encounters:  03/14/22  23.68 kg/m  02/28/22 23.82 kg/m  07/23/21 23.73 kg/m   Wt Readings from Last 3 Encounters:  03/14/22 165 lb (74.8 kg)  02/28/22 166 lb (75.3 kg)  07/01/21 165 lb 6.4 oz (75 kg)     CBC    Component Value Date/Time   WBC 10.0 04/20/2021 0432   RBC 5.03 04/20/2021 0432   HGB 15.2 04/20/2021 0432   HCT 44.8 04/20/2021 0432   PLT 215 04/20/2021 0432   MCV 89.1 04/20/2021 0432   MCH 30.2 04/20/2021 0432   MCHC 33.9 04/20/2021 0432   RDW 13.8 04/20/2021 0432   LYMPHSABS 4.5 (H) 04/19/2021 1745   MONOABS 0.7 04/19/2021 1745   EOSABS 0.2 04/19/2021 1745   BASOSABS 0.1 04/19/2021 1745    Chest Imaging: Head neck CT October 2022 Approximate 3 cm anterior mediastinal mass. The patient's images have been independently reviewed by me.    Pulmonary Functions Testing Results:    Latest Ref Rng & Units 07/22/2021    1:46 PM  PFT Results  FVC-Pre L 3.62   FVC-Predicted Pre % 79   FVC-Post L 3.69   FVC-Predicted Post % 80   Pre FEV1/FVC % % 80   Post FEV1/FCV % % 82   FEV1-Pre L 2.89   FEV1-Predicted Pre % 84   FEV1-Post L 3.02   DLCO uncorrected ml/min/mmHg 22.48   DLCO UNC% % 84   DLCO corrected ml/min/mmHg 22.48   DLCO COR %Predicted % 84   DLVA Predicted % 87   TLC L 6.26   TLC % Predicted % 89   RV % Predicted % 100    FeNO:   Pathology:   Echocardiogram:   Heart Catheterization:     Assessment & Plan:     ICD-10-CM   1. Mediastinal mass  J98.59 CT Chest Wo Contrast    2. Adenopathy  R59.9 CT Chest Wo  Contrast    3. Mass of mediastinum  J98.59 CT Chest Wo Contrast       Discussion:  This is a 66 year old gentleman incidentally found anterior mediastinal mass.  He recently had work-up starting in January.  Felt follow-up after having imaging.  He did have appointments that were rescheduled.  Plan: I think the best thing to consider at this time would be repeat CT imaging. We will look to see if there is any growth or change in the mass as well as the adenopathy within the chest. If everything looks stable then we will likely not need to consider biopsy. I will reach out to Dr. Kipp Brood from thoracic surgery who saw him last to give him an update.  Patient was counseled on smoking cessation as well.   Current Outpatient Medications:    atorvastatin (LIPITOR) 80 MG tablet, Take 1 tablet (80 mg total) by mouth at bedtime., Disp: 90 tablet, Rfl: 4   clopidogrel (PLAVIX) 75 MG tablet, Take 1 tablet (75 mg total) by mouth daily., Disp: 90 tablet, Rfl: 4   lisinopril (ZESTRIL) 10 MG tablet, Take 1 tablet (10 mg total) by mouth at bedtime., Disp: 30 tablet, Rfl: 1   metoprolol succinate (TOPROL XL) 25 MG 24 hr tablet, Take 1 tablet (25 mg total) by mouth daily., Disp: 90 tablet, Rfl: 3   Multiple Vitamin (MULTIVITAMIN ADULT) TABS, Take 1 tablet by mouth 3 (three) times a week., Disp: , Rfl:    Omega 3 1000 MG CAPS, Take 1,000 mg by mouth daily as needed (when not eating vegetables)., Disp: , Rfl:  Garner Nash, DO [facility] 03/14/2022 4:39 PM

## 2022-03-23 ENCOUNTER — Ambulatory Visit (HOSPITAL_BASED_OUTPATIENT_CLINIC_OR_DEPARTMENT_OTHER)
Admission: RE | Admit: 2022-03-23 | Discharge: 2022-03-23 | Disposition: A | Discharge status: Home / Self Care | Payer: Medicare HMO | Source: Ambulatory Visit | Attending: Pulmonary Disease | Admitting: Pulmonary Disease

## 2022-03-23 DIAGNOSIS — J9859 Other diseases of mediastinum, not elsewhere classified: Secondary | ICD-10-CM | POA: Insufficient documentation

## 2022-03-23 DIAGNOSIS — R599 Enlarged lymph nodes, unspecified: Secondary | ICD-10-CM | POA: Diagnosis present

## 2022-04-04 ENCOUNTER — Ambulatory Visit (INDEPENDENT_AMBULATORY_CARE_PROVIDER_SITE_OTHER): Payer: Medicare HMO

## 2022-04-04 DIAGNOSIS — I639 Cerebral infarction, unspecified: Secondary | ICD-10-CM

## 2022-04-05 LAB — CUP PACEART REMOTE DEVICE CHECK: Date Time Interrogation Session: 20231014091617

## 2022-04-07 NOTE — Progress Notes (Signed)
Carelink Summary Report / Loop Recorder 

## 2022-04-08 ENCOUNTER — Ambulatory Visit: Payer: Medicare HMO | Admitting: Acute Care

## 2022-04-12 ENCOUNTER — Encounter: Payer: Self-pay | Admitting: Acute Care

## 2022-04-12 ENCOUNTER — Ambulatory Visit: Payer: Medicare HMO | Admitting: Acute Care

## 2022-04-12 VITALS — BP 126/60 | HR 62 | Temp 97.9°F | Ht 70.0 in | Wt 168.4 lb

## 2022-04-12 DIAGNOSIS — J9859 Other diseases of mediastinum, not elsewhere classified: Secondary | ICD-10-CM | POA: Diagnosis not present

## 2022-04-12 DIAGNOSIS — F17200 Nicotine dependence, unspecified, uncomplicated: Secondary | ICD-10-CM

## 2022-04-12 DIAGNOSIS — F17210 Nicotine dependence, cigarettes, uncomplicated: Secondary | ICD-10-CM | POA: Diagnosis not present

## 2022-04-12 NOTE — Patient Instructions (Addendum)
It is good to see you today. I have messaged Dr. Valeta Harms and Kipp Brood, to see what best options are for next steps, either biopsy or resection.  I will call you when I hear back from them both  Please work on quitting smoking.  Call  1-800 QUIT NOW for free nicotine patches, gum or mints.  Consider acupuncture and hypnosis as other options to quit.  Follow up with either Dr. Valeta Harms of Dr. Kipp Brood once we hear back from them both regarding plan.  Please contact office for sooner follow up if symptoms do not improve or worsen or seek emergency care    Addendum I have called patient with Dr. Abran Duke feedback. I explained to the patient  that Dr. Kipp Brood feels the mass needs to be removed, and that he most likely does not need a biopsy first. I explained he would get a call from Dr. Abran Duke office to schedule an appointment. He verbalized understanding.

## 2022-04-12 NOTE — Progress Notes (Signed)
History of Present Illness Caleb Cardenas is a 66 y.o. male every day smoker with incidental finding of a 3 cm mediastinal mass when worked up for stroke like symptoms in the ED 03/2022. He was referred to Dr. Valeta Harms for evaluation .    Synopsis Patient was initially seen by Dr. Valeta Harms  in January 2023.  He was referred to cardiothoracic surgery for evaluation of anterior mediastinal mass.  Patient had mildly increased uptake in the PET scan favoring a thymoma versus lymphoma.  He had mildly enlarged right hilar nodes.  He was recommended to have  bronchoscopy and needle biopsy of these hilar nodes. However failed to follow-up with  Dr. Valeta Harms.  We contacted him in an attempt to have follow-up in April.  He had an appointment and failed to follow-up. 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  Possibly a thymic origin to include either benign thymoma or been benign thymic carcinoid.  It looks relatively stable since the 2017 image.  Patient unfortunately is a current smoker.  He smoked 1 pack a day for the past 40 years.   04/12/2022 Pt. Presents for follow up. CT scan shows a stable anterior mediastinal mass that is larger than the 09/2015 scan, but unchanged when compared to the PET scan done 9 months ago. He denies any symptoms. He has had some weight loss over the last 2-3 years of 7 pounds. He states he does not have much of an appetite. Pt. Was seen by Dr. Kipp Brood on July 23, 2021 for surgical evaluation.  Recommendation at that time was for excisional biopsy or resection of this mediastinal mass.  Most recent CT chest shows more benign looking nodes we will refer back to Dr. Kipp Brood for consideration of mass removal/resection.  I have called the patient and explained to him that he will get a call from Dr. Abran Duke office to schedule an appointment to discuss best plan for treatment  options.  He verbalized understanding He is considering having his knee replaced. He will need to have a pre-op risk assessment at that time, if he decides to move forward with the procedure.  Patient is on Plavix and for any procedures he will need a washout..    Test Results: CT Chest 03/23/2022 Anterior mediastinal mass, without change when compared to the prior PET-CT, but larger than it was from the exam dated 10/06/2015. 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 No enlarged mediastinal or hilar lymph nodes. There are calcified subcentimeter nodes that are consistent with healed granulomatous disease. No acute findings. Mild upper lobe paraseptal emphysema. Aortic atherosclerosis and 3 vessel coronary artery calcifications.   Head neck CT October 2022 Approximate 3 cm anterior mediastinal mass.    Latest Ref Rng & Units 04/20/2021    4:32 AM 04/19/2021    5:47 PM 04/19/2021    5:45 PM  CBC  WBC 4.0 - 10.5 K/uL 10.0   11.8   Hemoglobin 13.0 - 17.0 g/dL 15.2  15.0  15.1   Hematocrit 39.0 - 52.0 % 44.8  44.0  44.0   Platelets 150 - 400 K/uL 215   225        Latest Ref Rng & Units 04/20/2021    4:32 AM 04/19/2021    5:47 PM 04/19/2021    5:45 PM  BMP  Glucose 70 - 99 mg/dL 101  124  126   BUN  8 - 23 mg/dL '24  25  24   '$ Creatinine 0.61 - 1.24 mg/dL 1.48  1.40  1.50   Sodium 135 - 145 mmol/L 137  139  137   Potassium 3.5 - 5.1 mmol/L 4.5  3.4  3.5   Chloride 98 - 111 mmol/L 107  105  106   CO2 22 - 32 mmol/L 24   21   Calcium 8.9 - 10.3 mg/dL 8.4   8.8     BNP No results found for: "BNP"  ProBNP No results found for: "PROBNP"  PFT    Component Value Date/Time   FEV1PRE 2.89 07/22/2021 1346   FEV1POST 3.02 07/22/2021 1346   FVCPRE 3.62 07/22/2021 1346   FVCPOST 3.69 07/22/2021 1346   TLC 6.26 07/22/2021 1346   DLCOUNC 22.48 07/22/2021 1346   PREFEV1FVCRT 80 07/22/2021 1346   PSTFEV1FVCRT 82 07/22/2021 1346    CUP PACEART REMOTE DEVICE CHECK  Result Date:  04/05/2022 ILR summary report received. Battery status OK. Normal device function. No new symptom, tachy, brady, or pause episodes. No new AF episodes. Monthly summary reports and ROV/PRN LA  CT Chest Wo Contrast  Result Date: 03/25/2022 CLINICAL DATA:  Lymphadenopathy. Uptake on PET scan favoring a thymoma versus lymphoma. Mildly enlarged right hilar lymph nodes. EXAM: CT CHEST WITHOUT CONTRAST TECHNIQUE: Multidetector CT imaging of the chest was performed following the standard protocol without IV contrast. RADIATION DOSE REDUCTION: This exam was performed according to the departmental dose-optimization program which includes automated exposure control, adjustment of the mA and/or kV according to patient size and/or use of iterative reconstruction technique. COMPARISON:  MRI chest, 08/08/2021. PET-CT, 07/12/2021. Chest CT, 10/06/2015. FINDINGS: Cardiovascular: Heart is normal in size and configuration. Three-vessel coronary artery calcifications. No pericardial effusion. Great vessels are normal in caliber. Mild aortic atherosclerotic calcifications. Mediastinum/Nodes: Anterior mediastinal mass measuring 4.0 x 2.7 cm transversely, 3.0 cm superior to inferior. Mass measured 4.2 x 2.7 cm transversely on the prior PET-CT 3.4 x 2.0 cm transversely by 2.4 cm superior to inferior on the CT dated 10/06/2015. No enlarged mediastinal or hilar lymph nodes. Small calcified right paratracheal, AP window and hilar lymph nodes. Normal thyroid.  Trachea and esophagus are unremarkable. Lungs/Pleura: Mild upper lobe/apical paraseptal emphysema. Few minor areas of linear opacity, anteromedial right middle lobe and left upper lobe lingula consistent with atelectasis or scarring. No evidence of pneumonia or pulmonary edema. No lung mass or nodule. No pleural effusion or pneumothorax. Upper Abdomen: Aortic atherosclerosis. Visualized upper abdominal structures otherwise unremarkable. Musculoskeletal: No fracture or acute finding.  No bone lesion. No chest wall mass. IMPRESSION: 1. Anterior mediastinal mass, without change when compared to the prior PET-CT, but larger than it was from the exam dated 10/06/2015. 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 2. No enlarged mediastinal or hilar lymph nodes. There are calcified subcentimeter nodes that are consistent with healed granulomatous disease. 3. No acute findings. 4. Mild upper lobe paraseptal emphysema. 5. Aortic atherosclerosis and 3 vessel coronary artery calcifications. Aortic Atherosclerosis (ICD10-I70.0) and Emphysema (ICD10-J43.9). Electronically Signed   By: Lajean Manes M.D.   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 03/14/2022 Tay  Vaping Use   Vaping Use: Never used  Substance Use Topics   Alcohol use: Yes    Comment: occ   Drug use: No    Mr.Mcfarren reports that he has been smoking cigarettes. He has been smoking an average of .5 packs per day. He has never been exposed to tobacco smoke. He has never used smokeless tobacco. He reports current alcohol use. He reports that he does not use drugs.  Tobacco Cessation: Patient is a current everyday smoker, he smokes 15 to 20 cigarettes a day.  He has a 40+ pack year smoking history  Past surgical hx, Family hx, Social hx all reviewed.  Current Outpatient Medications on File Prior to Visit  Medication Sig   atorvastatin (LIPITOR) 80 MG tablet Take 1 tablet (80 mg total) by mouth at bedtime.   clopidogrel (PLAVIX) 75 MG tablet Take 1 tablet (75 mg total) by mouth daily.   lisinopril (ZESTRIL) 10 MG tablet Take 1 tablet (10 mg total) by mouth at bedtime.   metoprolol succinate  (TOPROL XL) 25 MG 24 hr tablet Take 1 tablet (25 mg total) by mouth daily.   Multiple Vitamin (MULTIVITAMIN ADULT) TABS Take 1 tablet by mouth 3 (three) times a week.   Omega 3 1000 MG CAPS Take 1,000 mg by mouth daily as needed (when not eating vegetables).   No current facility-administered medications on file prior to visit.     No Known Allergies  Review Of Systems:  Constitutional:   +  weight loss, No night sweats,  Fevers, chills, fatigue, or  lassitude.  HEENT:   No headaches,  Difficulty swallowing,  Tooth/dental problems, or  Sore throat,                No sneezing, itching, ear ache, nasal congestion, post nasal drip,   CV:  No chest pain,  Orthopnea, PND, swelling in lower extremities, anasarca, dizziness, palpitations, syncope.   GI  No heartburn, indigestion, abdominal pain, nausea, vomiting, diarrhea, change in bowel habits, loss of appetite, bloody stools.   Resp: No shortness of breath with exertion or at rest.  No excess mucus, no productive cough,  No non-productive cough,  No coughing up of blood.  No change in color of mucus.  No wheezing.  No chest wall deformity  Skin: no rash or lesions.  GU: no dysuria, change in color of urine, no urgency or frequency.  No flank pain, no hematuria   MS:  No joint pain or swelling.  No decreased range of motion.  No back pain.  Psych:  No change in mood or affect. No depression or anxiety.  No memory loss.   Vital Signs BP 126/60 (BP Location: Left Arm, Cuff Size: Normal)   Pulse 62   Temp 97.9 F (36.6 C) (Oral)   Ht '5\' 10"'$  (1.778 m)   Wt 168 lb 6.4 oz (76.4 kg)   SpO2 98%   BMI 24.16 kg/m    Physical Exam:  General- No distress,  A&Ox3, pleasant ENT: No sinus tenderness, TM clear, pale nasal mucosa, no oral exudate,no post nasal drip, no LAN Cardiac: S1, S2, regular rate and rhythm, no murmur Chest: No wheeze/ rales/ dullness; no accessory muscle use, no nasal flaring, no sternal retractions Abd.: Soft  Non-tender, ND, BS +, Body mass index is 24.16 kg/m.  Ext: No clubbing cyanosis, edema Neuro:  normal strength, MAE x 4, A&O x 3 Skin: No rashes, warm and dry, no lesions  Psych: normal mood and behavior   Assessment/Plan 3 cm anterior mediastinal mass and a smoker Stable on CT Chest 03/2022 Plan It is good to see you today. I have messaged Dr. Valeta Harms and Kipp Brood, to see what best options are for next steps, either biopsy or resection.  I will call you when I hear back from them both  Please work on quitting smoking.  Call  [phone] QUIT NOW for free nicotine patches, gum or mints.  Consider acupuncture and hypnosis as other options to quit.  Follow up with either Dr. Valeta Harms of Dr. Kipp Brood once we hear back from them both regarding plan.  Please contact office for sooner follow up if symptoms do not improve or worsen or seek emergency care    Addendum 4:40 pm 04/12/2022 I have called patient with Dr. Abran Duke feedback. I explained to the patient  that Dr. Kipp Brood feels the mass needs to be removed, and that he most likely does not need a biopsy first. I explained he would get a call from Dr. Abran Duke office to schedule an appointment. He verbalized understanding.   I spent 40 minutes dedicated to the care of this patient on the date of this encounter to include pre-visit review of records, face-to-face time with the patient discussing conditions above, post visit ordering of testing, clinical documentation with the electronic health record, making appropriate referrals as documented, and communicating necessary information to the patient's healthcare team.   Magdalen Spatz, NP 04/12/2022  4:41 PM

## 2022-04-28 NOTE — Progress Notes (Signed)
[address]       [place] [address]             [id number]                    Lynton L Mckamey [place] Medical Record #[id number] Date of Birth: 06-Jan-1956  Referring: Garner Nash, DO Primary Care: [facility] [place] Primary Cardiologist: None  Chief Complaint:   No chief complaint on file.   History of Present Illness:    Caleb Cardenas 66 y.o. 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  He was previously seen for this at the beginning of 2023, and at the time was noted to have large mass along with some hilar adenopathy.  He originally was scheduled to follow-up with Dr. Valeta Harms for endobronchial biopsy of the adenopathy but did not follow-up.  Repeat imaging shows regression of the adenopathy.  He has been asymptomatic since his last appointment.    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 03/14/2022 [place]    Social History   Substance and Sexual Activity  Alcohol Use Yes   Comment: occ     No Known Allergies  Current Outpatient Medications  Medication Sig Dispense Refill   atorvastatin (LIPITOR) 80 MG tablet Take 1 tablet (80 mg total) by mouth at bedtime. 90 tablet 4   clopidogrel (PLAVIX) 75 MG tablet Take 1 tablet (75 mg total) by mouth daily. 90 tablet 4   lisinopril (ZESTRIL) 10 MG tablet Take 1 tablet (10 mg total) by mouth at bedtime. 30 tablet 1   metoprolol succinate (TOPROL  XL) 25 MG 24 hr tablet Take 1 tablet (25 mg total) by mouth daily. 90 tablet 3   Multiple Vitamin (MULTIVITAMIN ADULT) TABS Take 1 tablet by mouth 3 (three) times a week.     Omega 3 1000 MG CAPS Take 1,000 mg by mouth daily as needed (when not eating vegetables).     No current facility-administered medications for this visit.    Review of Systems  Constitutional: Negative.   Respiratory: Negative.    Cardiovascular: Negative.     PHYSICAL EXAMINATION: There were no vitals taken for this visit.  Physical Exam Constitutional:      Appearance: He is normal weight.  HENT:     Head: Normocephalic and atraumatic.  Eyes:     Extraocular Movements: Extraocular movements intact.  Cardiovascular:     Rate and Rhythm: Normal rate.  Abdominal:     General: Abdomen is flat. There is no distension.  Musculoskeletal:        General: Normal range of motion.     Cervical back: Normal range of motion.  Skin:    General: Skin is warm and dry.  Neurological:  General: No focal deficit present.     Mental Status: He is alert and oriented to person, place, and time.      Diagnostic Studies & Laboratory data:     Recent Radiology Findings:   CUP PACEART REMOTE DEVICE CHECK  Result Date: 04/05/2022 ILR summary report received. Battery status OK. Normal device function. No new symptom, tachy, brady, or pause episodes. No new AF episodes. Monthly summary reports and ROV/PRN LA      I have independently reviewed the above radiology studies  and reviewed the findings with the patient.   Recent Lab Findings: Lab Results  Component Value Date   WBC 10.0 04/20/2021   HGB 15.2 04/20/2021   HCT 44.8 04/20/2021   PLT 215 04/20/2021   GLUCOSE 101 (H) 04/20/2021   CHOL 199 04/21/2020   TRIG 621 (HH) 04/21/2020   HDL 25 (L) 04/21/2020   LDLCALC 76 04/21/2020   ALT 21 04/19/2021   AST 23 04/19/2021   NA 137 04/20/2021   K 4.5 04/20/2021   CL 107 04/20/2021   CREATININE 1.48 (H)  04/20/2021   BUN 24 (H) 04/20/2021   CO2 24 04/20/2021   INR 1.0 04/19/2021   HGBA1C 6.0 (H) 04/21/2020       Problem List:  Mediastinum/Nodes: Anterior mediastinal mass measuring 4.0 x 2.7 cm transversely, 3.0 cm superior to inferior. Mass measured 4.2 x 2.7 cm transversely on the prior PET-CT 3.4 x 2.0 cm transversely by 2.4 cm superior to inferior on the CT dated 10/06/2015.    Assessment / Plan:   66 year old male previously seen for an anterior mediastinal mass.  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  It was previously stable for over 5 years, but most recent imaging shows there has been some growth.  He did previously also have some mildly enlarged lymph nodes but never followed up with Dr. Valeta Harms for biopsy.  It also appears as though he never had his tumor markers drawn.  Agree to proceed with a left robotic thoracoscopy, and resection of anterior mediastinal mass.  He will require Plavix washout prior to surgery.      Lajuana Matte 04/28/2022 1:12 PM

## 2022-04-29 ENCOUNTER — Other Ambulatory Visit: Payer: Self-pay | Admitting: *Deleted

## 2022-04-29 ENCOUNTER — Institutional Professional Consult (permissible substitution): Payer: Medicare HMO | Admitting: Thoracic Surgery (Cardiothoracic Vascular Surgery)

## 2022-04-29 ENCOUNTER — Telehealth: Payer: Self-pay | Admitting: *Deleted

## 2022-04-29 VITALS — BP 167/80 | HR 56 | Resp 18 | Ht 70.0 in | Wt 167.0 lb

## 2022-04-29 DIAGNOSIS — J9859 Other diseases of mediastinum, not elsewhere classified: Secondary | ICD-10-CM

## 2022-04-29 NOTE — Telephone Encounter (Signed)
Patient is all set for surgery w/ Dr. Kipp Brood on 12/21 at 7:30am.  Last dose of plavix taken will be 12/15.  Patient is aware and has no further questions.

## 2022-05-09 ENCOUNTER — Ambulatory Visit (INDEPENDENT_AMBULATORY_CARE_PROVIDER_SITE_OTHER): Payer: Medicare HMO

## 2022-05-09 DIAGNOSIS — I639 Cerebral infarction, unspecified: Secondary | ICD-10-CM | POA: Diagnosis not present

## 2022-05-10 LAB — CUP PACEART REMOTE DEVICE CHECK: Date Time Interrogation Session: 20231119230941

## 2022-05-28 ENCOUNTER — Other Ambulatory Visit: Payer: Self-pay | Admitting: Diagnostic Neuroimaging

## 2022-06-06 NOTE — Pre-Procedure Instructions (Signed)
Surgical Instructions    Your procedure is scheduled on June 09, 2022 .  Report to [facility] Main Entrance "A" at 5:30 A.M., then check in with the Admitting office.  Call this number if you have problems the morning of surgery:  [phone]   If you have any questions prior to your surgery date call [phone]: Open Monday-Friday 8am-4pm    Remember:  Do not eat or drink after midnight the night before your surgery     Take these medicines the morning of surgery with A SIP OF WATER:  metoprolol succinate (TOPROL XL)    STOP taking clopidogrel (PLAVIX) December 16th. Your last dose of this medication will be December 15th.   As of today, STOP taking any Aspirin (unless otherwise instructed by your surgeon) Aleve, Naproxen, Ibuprofen, Motrin, Advil, Goody's, BC's, all herbal medications, fish oil, and all vitamins.                     Do NOT Smoke (Tobacco/Vaping) for 24 hours prior to your procedure.  If you use a CPAP at night, you may bring your mask/headgear for your overnight stay.   Contacts, glasses, piercing's, hearing aid's, dentures or partials may not be worn into surgery, please bring cases for these belongings.    For patients admitted to the hospital, discharge time will be determined by your treatment team.   Patients discharged the day of surgery will not be allowed to drive home, and someone needs to stay with them for 24 hours.  SURGICAL WAITING ROOM VISITATION Patients having surgery or a procedure may have no more than 2 support people in the waiting area - these visitors may rotate.   Children under the age of 77 must have an adult with them who is not the patient. If the patient needs to stay at the hospital during part of their recovery, the visitor guidelines for inpatient rooms apply. Pre-op nurse will coordinate an appropriate time for 1 support person to accompany patient in pre-op.  This support person may not rotate.   Please refer to the  [facility] website for the visitor guidelines for Inpatients (after your surgery is over and you are in a regular room).    Special instructions:   [place]- Preparing For Surgery  Before surgery, you can play an important role. Because skin is not sterile, your skin needs to be as free of germs as possible. You can reduce the number of germs on your skin by washing with CHG (chlorahexidine gluconate) Soap before surgery.  CHG is an antiseptic cleaner which kills germs and bonds with the skin to continue killing germs even after washing.    Oral Hygiene is also important to reduce your risk of infection.  Remember - BRUSH YOUR TEETH THE MORNING OF SURGERY WITH YOUR REGULAR TOOTHPASTE  Please do not use if you have an allergy to CHG or antibacterial soaps. If your skin becomes reddened/irritated stop using the CHG.  Do not shave (including legs and underarms) for at least 48 hours prior to first CHG shower. It is OK to shave your face.  Please follow these instructions carefully.   Shower the NIGHT BEFORE SURGERY and the MORNING OF SURGERY  If you chose to wash your hair, wash your hair first as usual with your normal shampoo.  After you shampoo, rinse your hair and body thoroughly to remove the shampoo.  Use CHG Soap as you would any other liquid soap. You can apply CHG  directly to the skin and wash gently with a scrungie or a clean washcloth.   Apply the CHG Soap to your body ONLY FROM THE NECK DOWN.  Do not use on open wounds or open sores. Avoid contact with your eyes, ears, mouth and genitals (private parts). Wash Face and genitals (private parts)  with your normal soap.   Wash thoroughly, paying special attention to the area where your surgery will be performed.  Thoroughly rinse your body with warm water from the neck down.  DO NOT shower/wash with your normal soap after using and rinsing off the CHG Soap.  Pat yourself dry with a CLEAN TOWEL.  Wear CLEAN PAJAMAS to bed the  night before surgery  Place CLEAN SHEETS on your bed the night before your surgery  DO NOT SLEEP WITH PETS.   Day of Surgery: Take a shower with CHG soap. Do not wear jewelry or makeup Do not wear lotions, powders, perfumes/colognes, or deodorant. Do not shave 48 hours prior to surgery.  Men may shave face and neck. Do not bring valuables to the hospital.  [facility] is not responsible for any belongings or valuables. Do not wear nail polish, gel polish, artificial nails, or any other type of covering on natural nails (fingers and toes) If you have artificial nails or gel coating that need to be removed by a nail salon, please have this removed prior to surgery. Artificial nails or gel coating may interfere with anesthesia's ability to adequately monitor your vital signs.  Wear Clean/Comfortable clothing the morning of surgery Remember to brush your teeth WITH YOUR REGULAR TOOTHPASTE.   Please read over the following fact sheets that you were given.    If you received a COVID test during your pre-op visit  it is requested that you wear a mask when out in public, stay away from anyone that may not be feeling well and notify your surgeon if you develop symptoms. If you have been in contact with anyone that has tested positive in the last 10 days please notify you surgeon.

## 2022-06-07 ENCOUNTER — Encounter (HOSPITAL_COMMUNITY): Payer: Self-pay

## 2022-06-07 ENCOUNTER — Encounter (HOSPITAL_COMMUNITY)
Admission: RE | Admit: 2022-06-07 | Discharge: 2022-06-07 | Disposition: A | Discharge status: Home / Self Care | Payer: Medicare HMO | Source: Ambulatory Visit | Attending: Thoracic Surgery (Cardiothoracic Vascular Surgery) | Admitting: Thoracic Surgery (Cardiothoracic Vascular Surgery)

## 2022-06-07 ENCOUNTER — Ambulatory Visit (HOSPITAL_COMMUNITY)
Admission: RE | Admit: 2022-06-07 | Discharge: 2022-06-07 | Disposition: A | Discharge status: Home / Self Care | Payer: Medicare HMO | Source: Ambulatory Visit | Attending: Thoracic Surgery (Cardiothoracic Vascular Surgery) | Admitting: Thoracic Surgery (Cardiothoracic Vascular Surgery)

## 2022-06-07 ENCOUNTER — Other Ambulatory Visit: Payer: Self-pay

## 2022-06-07 VITALS — BP 154/65 | HR 62 | Temp 98.6°F | Resp 18 | Ht 70.0 in | Wt 163.9 lb

## 2022-06-07 DIAGNOSIS — Z8673 Personal history of transient ischemic attack (TIA), and cerebral infarction without residual deficits: Secondary | ICD-10-CM | POA: Insufficient documentation

## 2022-06-07 DIAGNOSIS — I10 Essential (primary) hypertension: Secondary | ICD-10-CM | POA: Insufficient documentation

## 2022-06-07 DIAGNOSIS — Z1152 Encounter for screening for COVID-19: Secondary | ICD-10-CM | POA: Insufficient documentation

## 2022-06-07 DIAGNOSIS — F17200 Nicotine dependence, unspecified, uncomplicated: Secondary | ICD-10-CM | POA: Insufficient documentation

## 2022-06-07 DIAGNOSIS — I493 Ventricular premature depolarization: Secondary | ICD-10-CM | POA: Insufficient documentation

## 2022-06-07 DIAGNOSIS — J9859 Other diseases of mediastinum, not elsewhere classified: Secondary | ICD-10-CM | POA: Insufficient documentation

## 2022-06-07 DIAGNOSIS — R222 Localized swelling, mass and lump, trunk: Secondary | ICD-10-CM | POA: Insufficient documentation

## 2022-06-07 DIAGNOSIS — Z01818 Encounter for other preprocedural examination: Secondary | ICD-10-CM | POA: Insufficient documentation

## 2022-06-07 HISTORY — DX: Vertebro-basilar artery syndrome: G45.0

## 2022-06-07 HISTORY — DX: Other diseases of mediastinum, not elsewhere classified: J98.59

## 2022-06-07 HISTORY — DX: Disorder of arteries and arterioles, unspecified: I77.9

## 2022-06-07 LAB — APTT: aPTT: 28 seconds (ref 24–36)

## 2022-06-07 LAB — COMPREHENSIVE METABOLIC PANEL
ALT: 11 U/L (ref 0–44)
AST: 26 U/L (ref 15–41)
Albumin: 3.7 g/dL (ref 3.5–5.0)
Alkaline Phosphatase: 97 U/L (ref 38–126)
Anion gap: 7 (ref 5–15)
BUN: 30 mg/dL — ABNORMAL HIGH (ref 8–23)
CO2: 25 mmol/L (ref 22–32)
Calcium: 8.3 mg/dL — ABNORMAL LOW (ref 8.9–10.3)
Chloride: 104 mmol/L (ref 98–111)
Creatinine, Ser: 1.88 mg/dL — ABNORMAL HIGH (ref 0.61–1.24)
GFR, Estimated: 39 mL/min — ABNORMAL LOW (ref >60)
Glucose, Bld: 104 mg/dL — ABNORMAL HIGH (ref 70–99)
Potassium: 3.7 mmol/L (ref 3.5–5.1)
Sodium: 136 mmol/L (ref 135–145)
Total Bilirubin: 0.8 mg/dL (ref 0.3–1.2)
Total Protein: 7.2 g/dL (ref 6.5–8.1)

## 2022-06-07 LAB — URINALYSIS, ROUTINE W REFLEX MICROSCOPIC
Bilirubin Urine: NEGATIVE
Glucose, UA: NEGATIVE mg/dL
Hgb urine dipstick: NEGATIVE
Ketones, ur: NEGATIVE mg/dL
Leukocytes,Ua: NEGATIVE
Nitrite: NEGATIVE
Protein, ur: NEGATIVE mg/dL
Specific Gravity, Urine: 1.019 (ref 1.005–1.030)
pH: 5 (ref 5.0–8.0)

## 2022-06-07 LAB — CBC
HCT: 46.5 % (ref 39.0–52.0)
Hemoglobin: 15.2 g/dL (ref 13.0–17.0)
MCH: 30.3 pg (ref 26.0–34.0)
MCHC: 32.7 g/dL (ref 30.0–36.0)
MCV: 92.8 fL (ref 80.0–100.0)
Platelets: 149 10*3/uL — ABNORMAL LOW (ref 150–400)
RBC: 5.01 MIL/uL (ref 4.22–5.81)
RDW: 14.5 % (ref 11.5–15.5)
WBC: 7.3 10*3/uL (ref 4.0–10.5)
nRBC: 0 % (ref 0.0–0.2)

## 2022-06-07 LAB — TYPE AND SCREEN
ABO/RH(D): O NEG
Antibody Screen: NEGATIVE

## 2022-06-07 LAB — PROTIME-INR
INR: 1 (ref 0.8–1.2)
Prothrombin Time: 13.1 seconds (ref 11.4–15.2)

## 2022-06-07 LAB — SURGICAL PCR SCREEN
MRSA, PCR: NEGATIVE
Staphylococcus aureus: NEGATIVE

## 2022-06-07 NOTE — Progress Notes (Addendum)
PCP - [facility] Cardiologist - Denies EP: Dr. Quentin Ore - Placed Loop Recorder September 2023 Neurologist: Dr. Andrey Spearman  PPM/ICD - Denies Device Orders - n/a Rep Notified - n/a  Chest x-ray - 06/07/2022 EKG - 06/07/2022 Stress Test - Per pt, many years ago, result normal ECHO - 04/20/2021 Cardiac Cath - Denies  Sleep Study - Denies CPAP - n/a  No DM  Last dose of GLP1 agonist- n/a GLP1 instructions: n/a  Blood Thinner Instructions: Pt has already held his Plavix per surgeon instructions. Last dose was December 15th Aspirin Instructions: n/a  NPO after midnight  COVID TEST- Yes. Result pending   Anesthesia review: Yes. Follow-up on symptoms of cough/ congestion at PAT appointment. Myra Gianotti, PA-C discussed with patient.   Patient denies shortness of breath, fever, and chest pain at PAT appointment   All instructions explained to the patient, with a verbal understanding of the material. Patient agrees to go over the instructions while at home for a better understanding. Patient also instructed to self quarantine after being tested for COVID-19. The opportunity to ask questions was provided.

## 2022-06-07 NOTE — Progress Notes (Signed)
Anesthesia APP Evaluation:  Case: [id number] Date/Time: 06/09/22 0715   Procedure: XI ROBOTIC ASSISTED RESECTION OF MEDIASTINAL MASS (Left)   Anesthesia type: General   Pre-op diagnosis: MEDIASTINAL MASS   Location: [facility] OR ROOM 10 / [place] OR   Surgeons: Lajuana Matte, MD       DISCUSSION: Patient is a 66 year old male scheduled for the above procedure.  History includes smoking (1 PPD), HTN, CVA (03/2020), vertigo (not currently), vertebrobasilar insufficiency, loop recorder (02/28/22), mediastinal mass.   He reported intermittent dizziness that started around 02/2020 then sudden blurred vision on 03/31/22. ED visit 04/01/20 showed remote left PICA territory infarct with a few additional tiny right cerebellar infarcts. He was started on Plavix. 04/01/20 CVA work-up included unremarkable TEE with negative bubble study and MRA head/neck showing bilateral posterior circulation acute ischemia with relatively large left PICA territory infarct, suspected occlusion or distal left P3 segment, moderate-severe vertebral artery stenosis V4 segments and moderate stenosis proximal left ECA. He had ongoing dizziness with suspected vertebrobasilar insufficiency and had additional neurologic testing in laste October/early November 2022. 04/19/21 CTA head and neck had an incidental finding of partially imaged anterior mediastinal mass 4.3 x2.7 cm, increased in size since 2017 chest CTA. CT chest or PET-CT recommended. He has since had a PET scan, MR Chest, CT chest and has seen pulmonologist Dr. Valeta Harms and CT surgeon Dr. Kipp Brood with the above procedure recommended. He denied any current dizziness or vertigo. He did recently have a loop recorder placed for history of cryptogenic CVA in 2021.  In regards to vertebrobasilar insufficiency, notes indicate that neurologist Dr. Erlinda Hong "discussed with patient in detail regarding options of maximizing medical treatment versus endovascular treatment.  Patient reportedly opted for  medical treatment first and if it fails then will consider endovascular treatment." ASA and Plavix 3 2 months recommended then Plavix alone. BP goal of 130-150 due to vascular stenosis. Smoking cessation encouraged.   I evaluated Caleb Cardenas during his PAT visit. He reported his co-worker had been around sick grandchildren last week. On Saturday 05/21/22, Caleb Cardenas noted a mainly dry cough with intermittent white-green phlegm. Cough is new for him. He has been taking Mucinex. He has has had a runny nose. Denied fever or sore throat. Denied SOB. He does not feel particularly bad, but is concerned about how coughing could affect his surgery.    Noted two echocardiograms ordered by neurology in 2022. 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 He said he had seen cardiologist Dr. Mardene Speak formerly with [facility], but primarily for vascular disease. (I called [facility] and there was no prior stress test or echocardiogram there.) He denied chest pain, SOB, edema, syncope. 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 He has to be aware of neck extension due to dizziness likely related to vertebrobasilar insufficiency. He also reported intermittent diarrhea for the past month.   Labs show BUN 30, Creatinine 1.88--previously BUN 24-25 and Creatinine 1.40-1.50 ~ 03/2021. WBC 7.3, H/H 15.2/46.5, PLT 149.   Presurgical CXR and COVID-19 tests are in process. Last Plavix reported as 06/03/22. In the interim, I have reviewed above with anesthesiologist Oren Bracket, MD and have sent communication to Dr. Kipp Brood regarding respiratory symptoms  and renal labs and inquiring if he would recommend any additional cardiac or vascular  testing prior to surgery.   ADDENDUM 06/08/22 3:04 PM: COVID-19 test was negative. CXR is still in process. Ryan at [employer] spoke with Caleb Cardenas this morning and he says he feels improved. Dr. Kipp Brood has reviewed my communication and did not have any additional preoperative recommendations. Anesthesia team and surgeon to re-evaluate on the day of surgery.   VS: BP (!) 154/65   Pulse 62   Temp 37 C (Oral)   Resp 18   Ht '5\' 10"'$  (1.778 m)   Wt 74.3 kg   SpO2 98%   BMI 23.52 kg/m  Provider and RN wore face masks. Patient without acute distress. No conversational dyspnea. Voice was mildly hoarse--he felt this was chronic, however. I/VI murmur heard. Rhythm regular. Lungs overall clear. He does have a left carotid bruit, no bruit heard on the right. No ankle edema.    PROVIDERS: Sandi Mariscal, MD is PCP [facility], [place]). He also reported prior cardiology evaluation there by Dr. Mardene Speak, but said this was primarily for vascular disease post-CVA. (Dr. Brigitte Pulse is no longer at [facility], and medical records there said there is no echo or stress test done there.)    - Andrey Spearman, MD is neurologist - June Leap, DO is pulmonlogist - Lars Mage, MD is EP cardiologist (for loop recorder)   LABS: Preoperative labs noted. See DISCUSSION.  (all labs ordered are listed, but only abnormal results are displayed)  Labs Reviewed  CBC - Abnormal; Notable for the following components:      Result Value   Platelets 149 (*)    All other components within normal limits  COMPREHENSIVE METABOLIC PANEL - Abnormal; Notable for the following components:   Glucose, Bld 104 (*)    BUN 30 (*)    Creatinine, Ser 1.88 (*)    Calcium 8.3 (*)    GFR, Estimated 39 (*)    All other components within normal limits  SURGICAL PCR SCREEN  SARS CORONAVIRUS 2 (TAT 6-24 HRS)  PROTIME-INR  APTT  URINALYSIS, ROUTINE W REFLEX MICROSCOPIC  TYPE AND SCREEN    PFTs 07/22/21: FVC 3.62  (79%), poast 3.69 (80%). FEV1 2.89 (84%), post 3.02 (88%). DLCO unc/cor 22.48 (84%).   IMAGES: CXR 06/07/22: In process.  CT Chest 03/23/22: IMPRESSION: 1. Anterior mediastinal mass, without change when compared to the prior PET-CT, but larger than it was from the exam dated 10/06/2015. 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 2. No enlarged mediastinal or hilar lymph nodes. There are calcified subcentimeter nodes that are consistent with healed granulomatous disease. 3. No acute findings. 4. Mild upper lobe paraseptal emphysema. 5. Aortic atherosclerosis and 3 vessel coronary artery calcifications.   MR Chest 08/08/21: IMPRESSION: 1. Smoothly marginated 4.1 x 2.6 x 3.0 cm anterior mediastinal mass which has indeterminate imaging characteristics, but not suggestive of a lesion of either nodal or thyroid origin. However, this lesion has been relatively stable in size dating back to prior study from 10/06/2015, strongly suggestive of a benign etiology, making a germ-cell tumor unlikely. 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 Another differential consideration would include a thymic carcinoid tumor.   PET Scan 07/12/21: IMPRESSION: 1. Anterior mediastinal mass currently measuring 4.2 by 2.7 cm has a maximum SUV of 4.4, mildly abnormally hypermetabolic. This corresponds to Deauville 4 activity. This  lesion has mildly increased in size since 2017. 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 Germ-cell tumor is a less likely differential diagnostic consideration. Thymic carcinoma is substantially less likely given the slow growth and the SUV less than 5.0. 2. Mildly enlarged right hilar lymph node has mildly accentuated metabolic activity, and there is also some faintly accentuated right infrahilar activity. Fine calcifications along the right hilum and along  other nodal structures favoring at least a component of old granulomatous disease. 3. Mildly accentuated activity in the right posterolateral peripheral zone of the prostate gland near the base. The appearance is nonspecific and could be neoplastic or inflammatory/postinflammatory. Correlate with PSA levels in determining whether further workup warranted. 4. 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 Recommend follow-up every 3 years. Reference: J Am Coll Radiol 6010;93:235-573. 5. Other imaging findings of potential clinical significance: Aortic Atherosclerosis (ICD10-I70.0) and Emphysema (ICD10-J43.9). Coronary atherosclerosis and peripheral atherosclerosis. Remote left cerebellar infarct.  CTA Head/neck 05/18/21: IMPRESSION: - Advanced atheromatous disease in the posterior circulation: - *65% bilateral V1 segment stenosis *Distal right vertebral occlusion at the level of the dura. The distal right V4 segment is reconstituted from the basilar, feeding the right PICA. *At least 75% stenosis at the left V4 segment. 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 - Left common carotid origin stenosis measuring 65%. Left ICA origin stenosis measuring 60%. - Technically limited intracranial CTA. No detected change from study approximally 1 month ago.    EKG: 06/07/22:  Sinus bradycardia with occasional Premature ventricular complexes Possible Left atrial enlargement Prolonged QT Abnormal ECG When compared with ECG of 19-Apr-2021 18:07,  CV: Event monitor 05/19/21-06/17/21:  Sinus rhythm Nocturnal bradycardia No afib Nonsustained ventricular tachycardia No sustained arrhythmias Baseline artifact limits interpretation at times   Echo 04/20/21 (ordered by neurology): IMPRESSIONS   1. Hypokinesis of the inferior wall (base, mid). Left ventricular  ejection fraction, by estimation, is 55%. The left ventricle has normal  function. There is mild  concentric left ventricular hypertrophy. Left  ventricular diastolic parameters are  consistent with Grade I diastolic dysfunction (impaired relaxation).  Elevated left atrial pressure.   2. Right ventricular systolic function is normal. The right ventricular  size is normal.   3. Multiple jets of MR. The mitral valve is myxomatous. Mild mitral valve  regurgitation.   4. The aortic valve is tricuspid. Aortic valve regurgitation is mild to  moderate. Mild to moderate aortic valve sclerosis/calcification is  present, without any evidence of aortic stenosis.   5. The inferior vena cava is normal in size with greater than 50%  respiratory variability, suggesting right atrial pressure of 3 mmHg.  - Comparison echo 06/26/20: LVEF 60-65%, no regional wall motion abnormalities, mild concentric LVH, grade 1 diastolic dysfunction, normal RVSF, normal PASP, trivial MR, mild AR, ascending aorta 37 mm, agitated saline contrast bubble study was negative, with no evidence of any interatrial shunt.     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    Myra Gianotti, PA-C Surgical Short Stay/Anesthesiology [facility] Phone [phone] [facility] Phone [phone] 06/07/2022 5:07 PM

## 2022-06-08 LAB — SARS CORONAVIRUS 2 (TAT 6-24 HRS): SARS Coronavirus 2: NEGATIVE

## 2022-06-08 NOTE — Anesthesia Preprocedure Evaluation (Signed)
Anesthesia Evaluation  Patient identified by MRN, date of birth, ID band Patient awake    Reviewed: Allergy & Precautions, NPO status , Patient's Chart, lab work & pertinent test results  Airway Mallampati: II       Dental   Pulmonary Current Smoker and Patient abstained from smoking.   breath sounds clear to auscultation       Cardiovascular hypertension,  Rhythm:Regular Rate:Normal     Neuro/Psych CVA    GI/Hepatic negative GI ROS, Neg liver ROS,,,  Endo/Other    Renal/GU Renal disease     Musculoskeletal   Abdominal   Peds  Hematology   Anesthesia Other Findings   Reproductive/Obstetrics                             Anesthesia Physical Anesthesia Plan  ASA: 3  Anesthesia Plan: General   Post-op Pain Management:    Induction: Intravenous  PONV Risk Score and Plan: 2 and Ondansetron, Dexamethasone and Midazolam  Airway Management Planned: Double Lumen EBT  Additional Equipment: ClearSight  Intra-op Plan:   Post-operative Plan: Possible Post-op intubation/ventilation  Informed Consent:      Dental advisory given  Plan Discussed with: CRNA, Anesthesiologist and Surgeon  Anesthesia Plan Comments: (See PAT note written by Myra Gianotti, PA-C.  )       Anesthesia Quick Evaluation

## 2022-06-09 ENCOUNTER — Other Ambulatory Visit: Payer: Self-pay

## 2022-06-09 ENCOUNTER — Inpatient Hospital Stay (HOSPITAL_COMMUNITY): Payer: Medicare HMO

## 2022-06-09 ENCOUNTER — Inpatient Hospital Stay (HOSPITAL_COMMUNITY): Payer: Medicare HMO | Admitting: Certified Registered"

## 2022-06-09 ENCOUNTER — Encounter (HOSPITAL_COMMUNITY): Payer: Self-pay | Admitting: Thoracic Surgery (Cardiothoracic Vascular Surgery)

## 2022-06-09 ENCOUNTER — Inpatient Hospital Stay (HOSPITAL_COMMUNITY)
Admission: RE | Admit: 2022-06-09 | Discharge: 2022-06-11 | DRG: 167 | Disposition: A | Discharge status: Home / Self Care | Payer: Medicare HMO | Source: Ambulatory Visit | Attending: Thoracic Surgery (Cardiothoracic Vascular Surgery) | Admitting: Thoracic Surgery (Cardiothoracic Vascular Surgery)

## 2022-06-09 ENCOUNTER — Inpatient Hospital Stay (HOSPITAL_COMMUNITY): Payer: Medicare HMO | Admitting: Vascular Surgery

## 2022-06-09 ENCOUNTER — Encounter (HOSPITAL_COMMUNITY)
Admission: RE | Disposition: A | Discharge status: Home / Self Care | Payer: Self-pay | Source: Ambulatory Visit | Attending: Thoracic Surgery (Cardiothoracic Vascular Surgery)

## 2022-06-09 DIAGNOSIS — Z95828 Presence of other vascular implants and grafts: Secondary | ICD-10-CM | POA: Diagnosis not present

## 2022-06-09 DIAGNOSIS — Z820 Family history of epilepsy and other diseases of the nervous system: Secondary | ICD-10-CM | POA: Diagnosis not present

## 2022-06-09 DIAGNOSIS — Z8673 Personal history of transient ischemic attack (TIA), and cerebral infarction without residual deficits: Secondary | ICD-10-CM | POA: Diagnosis not present

## 2022-06-09 DIAGNOSIS — I459 Conduction disorder, unspecified: Secondary | ICD-10-CM | POA: Diagnosis present

## 2022-06-09 DIAGNOSIS — J9859 Other diseases of mediastinum, not elsewhere classified: Secondary | ICD-10-CM

## 2022-06-09 DIAGNOSIS — N1832 Chronic kidney disease, stage 3b: Secondary | ICD-10-CM | POA: Diagnosis present

## 2022-06-09 DIAGNOSIS — Z79899 Other long term (current) drug therapy: Secondary | ICD-10-CM

## 2022-06-09 DIAGNOSIS — Z7902 Long term (current) use of antithrombotics/antiplatelets: Secondary | ICD-10-CM

## 2022-06-09 DIAGNOSIS — R001 Bradycardia, unspecified: Secondary | ICD-10-CM | POA: Diagnosis present

## 2022-06-09 DIAGNOSIS — I10 Essential (primary) hypertension: Secondary | ICD-10-CM | POA: Diagnosis not present

## 2022-06-09 DIAGNOSIS — Z1152 Encounter for screening for COVID-19: Secondary | ICD-10-CM

## 2022-06-09 DIAGNOSIS — Z800 Family history of malignant neoplasm of digestive organs: Secondary | ICD-10-CM | POA: Diagnosis not present

## 2022-06-09 DIAGNOSIS — N289 Disorder of kidney and ureter, unspecified: Secondary | ICD-10-CM | POA: Diagnosis not present

## 2022-06-09 DIAGNOSIS — I129 Hypertensive chronic kidney disease with stage 1 through stage 4 chronic kidney disease, or unspecified chronic kidney disease: Secondary | ICD-10-CM | POA: Diagnosis present

## 2022-06-09 DIAGNOSIS — R222 Localized swelling, mass and lump, trunk: Secondary | ICD-10-CM | POA: Diagnosis not present

## 2022-06-09 DIAGNOSIS — F17210 Nicotine dependence, cigarettes, uncomplicated: Secondary | ICD-10-CM | POA: Diagnosis present

## 2022-06-09 DIAGNOSIS — Z8249 Family history of ischemic heart disease and other diseases of the circulatory system: Secondary | ICD-10-CM

## 2022-06-09 DIAGNOSIS — G450 Vertebro-basilar artery syndrome: Secondary | ICD-10-CM | POA: Diagnosis present

## 2022-06-09 LAB — ABO/RH: ABO/RH(D): O NEG

## 2022-06-09 SURGERY — EXCISION, MASS, MEDIASTINUM, ROBOT-ASSISTED
Anesthesia: General | Laterality: Left

## 2022-06-09 MED ORDER — FENTANYL CITRATE (PF) 100 MCG/2ML IJ SOLN: 25.0000 ug | INTRAMUSCULAR | Status: DC | PRN

## 2022-06-09 MED ORDER — MIDAZOLAM HCL 2 MG/2ML IJ SOLN
INTRAMUSCULAR | Status: AC
  Filled 2022-06-09: qty 2

## 2022-06-09 MED ORDER — ONDANSETRON HCL 4 MG/2ML IJ SOLN
INTRAMUSCULAR | Status: AC
  Filled 2022-06-09: qty 2

## 2022-06-09 MED ORDER — PROPOFOL 10 MG/ML IV BOLUS
INTRAVENOUS | Status: AC
  Filled 2022-06-09: qty 20

## 2022-06-09 MED ORDER — LACTATED RINGERS IV SOLN: INTRAVENOUS | Status: DC | PRN

## 2022-06-09 MED ORDER — HYDROMORPHONE HCL 1 MG/ML IJ SOLN
0.2500 mg | INTRAMUSCULAR | Status: DC | PRN
  Administered 2022-06-09: 0.25 mg via INTRAVENOUS
  Administered 2022-06-09: 0.5 mg via INTRAVENOUS
  Administered 2022-06-09: 0.25 mg via INTRAVENOUS

## 2022-06-09 MED ORDER — BUPIVACAINE LIPOSOME 1.3 % IJ SUSP
INTRAMUSCULAR | Status: AC
  Filled 2022-06-09: qty 20

## 2022-06-09 MED ORDER — METOPROLOL SUCCINATE ER 25 MG PO TB24
25.0000 mg | ORAL_TABLET | Freq: Every day | ORAL | Status: DC
  Administered 2022-06-10: 25 mg via ORAL
  Filled 2022-06-09 (×2): qty 1

## 2022-06-09 MED ORDER — ONDANSETRON HCL 4 MG/2ML IJ SOLN
INTRAMUSCULAR | Status: DC | PRN
  Administered 2022-06-09: 4 mg via INTRAVENOUS

## 2022-06-09 MED ORDER — PHENYLEPHRINE 80 MCG/ML (10ML) SYRINGE FOR IV PUSH (FOR BLOOD PRESSURE SUPPORT)
PREFILLED_SYRINGE | INTRAVENOUS | Status: DC | PRN
  Administered 2022-06-09 (×2): 80 ug via INTRAVENOUS
  Administered 2022-06-09: 160 ug via INTRAVENOUS
  Administered 2022-06-09: 80 ug via INTRAVENOUS

## 2022-06-09 MED ORDER — BISACODYL 5 MG PO TBEC
10.0000 mg | DELAYED_RELEASE_TABLET | Freq: Every day | ORAL | Status: DC
  Administered 2022-06-09 – 2022-06-10 (×2): 10 mg via ORAL
  Filled 2022-06-09 (×3): qty 2

## 2022-06-09 MED ORDER — DEXAMETHASONE SODIUM PHOSPHATE 10 MG/ML IJ SOLN
INTRAMUSCULAR | Status: DC | PRN
  Administered 2022-06-09: 5 mg via INTRAVENOUS

## 2022-06-09 MED ORDER — OXYCODONE HCL 5 MG PO TABS
5.0000 mg | ORAL_TABLET | ORAL | Status: DC | PRN
  Administered 2022-06-09: 5 mg via ORAL
  Filled 2022-06-09: qty 1

## 2022-06-09 MED ORDER — SODIUM CHLORIDE FLUSH 0.9 % IV SOLN: INTRAVENOUS | Status: DC | PRN

## 2022-06-09 MED ORDER — CHLORHEXIDINE GLUCONATE 0.12 % MT SOLN
15.0000 mL | Freq: Once | OROMUCOSAL | Status: AC
  Administered 2022-06-09: 15 mL via OROMUCOSAL
  Filled 2022-06-09: qty 15

## 2022-06-09 MED ORDER — SUGAMMADEX SODIUM 200 MG/2ML IV SOLN
INTRAVENOUS | Status: DC | PRN
  Administered 2022-06-09: 200 mg via INTRAVENOUS

## 2022-06-09 MED ORDER — TRAMADOL HCL 50 MG PO TABS: 50.0000 mg | ORAL_TABLET | Freq: Four times a day (QID) | ORAL | Status: DC | PRN

## 2022-06-09 MED ORDER — 0.9 % SODIUM CHLORIDE (POUR BTL) OPTIME
TOPICAL | Status: DC | PRN
  Administered 2022-06-09: 2000 mL

## 2022-06-09 MED ORDER — ORAL CARE MOUTH RINSE: 15.0000 mL | OROMUCOSAL | Status: DC | PRN

## 2022-06-09 MED ORDER — BUPIVACAINE HCL (PF) 0.5 % IJ SOLN
INTRAMUSCULAR | Status: AC
  Filled 2022-06-09: qty 30

## 2022-06-09 MED ORDER — FENTANYL CITRATE (PF) 250 MCG/5ML IJ SOLN
INTRAMUSCULAR | Status: AC
  Filled 2022-06-09: qty 5

## 2022-06-09 MED ORDER — CEFAZOLIN SODIUM-DEXTROSE 2-4 GM/100ML-% IV SOLN
2.0000 g | INTRAVENOUS | Status: AC
  Administered 2022-06-09: 2 g via INTRAVENOUS
  Filled 2022-06-09: qty 100

## 2022-06-09 MED ORDER — EPHEDRINE SULFATE-NACL 50-0.9 MG/10ML-% IV SOSY
PREFILLED_SYRINGE | INTRAVENOUS | Status: DC | PRN
  Administered 2022-06-09 (×3): 5 mg via INTRAVENOUS

## 2022-06-09 MED ORDER — ENOXAPARIN SODIUM 40 MG/0.4ML IJ SOSY
40.0000 mg | PREFILLED_SYRINGE | Freq: Every day | INTRAMUSCULAR | Status: DC
  Administered 2022-06-09 – 2022-06-10 (×2): 40 mg via SUBCUTANEOUS
  Filled 2022-06-09 (×2): qty 0.4

## 2022-06-09 MED ORDER — PHENYLEPHRINE 80 MCG/ML (10ML) SYRINGE FOR IV PUSH (FOR BLOOD PRESSURE SUPPORT)
PREFILLED_SYRINGE | INTRAVENOUS | Status: AC
  Filled 2022-06-09: qty 10

## 2022-06-09 MED ORDER — PHENYLEPHRINE HCL-NACL 20-0.9 MG/250ML-% IV SOLN
INTRAVENOUS | Status: DC | PRN
  Administered 2022-06-09: 25 ug/min via INTRAVENOUS

## 2022-06-09 MED ORDER — LIDOCAINE 2% (20 MG/ML) 5 ML SYRINGE
INTRAMUSCULAR | Status: DC | PRN
  Administered 2022-06-09: 60 mg via INTRAVENOUS

## 2022-06-09 MED ORDER — PANTOPRAZOLE SODIUM 40 MG PO TBEC
40.0000 mg | DELAYED_RELEASE_TABLET | Freq: Every day | ORAL | Status: DC
  Administered 2022-06-10 – 2022-06-11 (×2): 40 mg via ORAL
  Filled 2022-06-09 (×2): qty 1

## 2022-06-09 MED ORDER — MORPHINE SULFATE (PF) 2 MG/ML IV SOLN: 1.0000 mg | INTRAVENOUS | Status: DC | PRN

## 2022-06-09 MED ORDER — DEXAMETHASONE SODIUM PHOSPHATE 10 MG/ML IJ SOLN
INTRAMUSCULAR | Status: AC
  Filled 2022-06-09: qty 1

## 2022-06-09 MED ORDER — MIDAZOLAM HCL 5 MG/5ML IJ SOLN
INTRAMUSCULAR | Status: DC | PRN
  Administered 2022-06-09: 2 mg via INTRAVENOUS

## 2022-06-09 MED ORDER — ROCURONIUM BROMIDE 10 MG/ML (PF) SYRINGE
PREFILLED_SYRINGE | INTRAVENOUS | Status: AC
  Filled 2022-06-09: qty 10

## 2022-06-09 MED ORDER — EPHEDRINE 5 MG/ML INJ
INTRAVENOUS | Status: AC
  Filled 2022-06-09: qty 5

## 2022-06-09 MED ORDER — LIDOCAINE 2% (20 MG/ML) 5 ML SYRINGE
INTRAMUSCULAR | Status: AC
  Filled 2022-06-09: qty 5

## 2022-06-09 MED ORDER — ONDANSETRON HCL 4 MG/2ML IJ SOLN: 4.0000 mg | Freq: Four times a day (QID) | INTRAMUSCULAR | Status: DC | PRN

## 2022-06-09 MED ORDER — GLYCOPYRROLATE PF 0.2 MG/ML IJ SOSY
PREFILLED_SYRINGE | INTRAMUSCULAR | Status: DC | PRN
  Administered 2022-06-09: .2 mg via INTRAVENOUS

## 2022-06-09 MED ORDER — LACTATED RINGERS IV SOLN: INTRAVENOUS | Status: DC

## 2022-06-09 MED ORDER — CEFAZOLIN SODIUM-DEXTROSE 2-4 GM/100ML-% IV SOLN
2.0000 g | Freq: Three times a day (TID) | INTRAVENOUS | Status: AC
  Administered 2022-06-09 (×2): 2 g via INTRAVENOUS
  Filled 2022-06-09 (×2): qty 100

## 2022-06-09 MED ORDER — KETOROLAC TROMETHAMINE 15 MG/ML IJ SOLN
15.0000 mg | Freq: Four times a day (QID) | INTRAMUSCULAR | Status: DC
  Administered 2022-06-09 – 2022-06-10 (×3): 15 mg via INTRAVENOUS
  Filled 2022-06-09 (×3): qty 1

## 2022-06-09 MED ORDER — FENTANYL CITRATE (PF) 250 MCG/5ML IJ SOLN
INTRAMUSCULAR | Status: DC | PRN
  Administered 2022-06-09: 150 ug via INTRAVENOUS
  Administered 2022-06-09 (×2): 50 ug via INTRAVENOUS

## 2022-06-09 MED ORDER — ADULT MULTIVITAMIN W/MINERALS CH
1.0000 | ORAL_TABLET | ORAL | Status: DC
  Administered 2022-06-10: 1 via ORAL
  Filled 2022-06-09: qty 1

## 2022-06-09 MED ORDER — ACETAMINOPHEN 500 MG PO TABS
1000.0000 mg | ORAL_TABLET | Freq: Four times a day (QID) | ORAL | Status: DC
  Administered 2022-06-09 – 2022-06-11 (×7): 1000 mg via ORAL
  Filled 2022-06-09 (×7): qty 2

## 2022-06-09 MED ORDER — ATORVASTATIN CALCIUM 80 MG PO TABS
80.0000 mg | ORAL_TABLET | Freq: Every day | ORAL | Status: DC
  Administered 2022-06-09 – 2022-06-10 (×2): 80 mg via ORAL
  Filled 2022-06-09 (×2): qty 1

## 2022-06-09 MED ORDER — PROPOFOL 10 MG/ML IV BOLUS
INTRAVENOUS | Status: DC | PRN
  Administered 2022-06-09: 160 mg via INTRAVENOUS

## 2022-06-09 MED ORDER — HYDROMORPHONE HCL 1 MG/ML IJ SOLN
INTRAMUSCULAR | Status: AC
  Filled 2022-06-09: qty 1

## 2022-06-09 MED ORDER — SENNOSIDES-DOCUSATE SODIUM 8.6-50 MG PO TABS
1.0000 | ORAL_TABLET | Freq: Every day | ORAL | Status: DC
  Administered 2022-06-09 – 2022-06-10 (×2): 1 via ORAL
  Filled 2022-06-09 (×2): qty 1

## 2022-06-09 MED ORDER — ROCURONIUM BROMIDE 10 MG/ML (PF) SYRINGE
PREFILLED_SYRINGE | INTRAVENOUS | Status: DC | PRN
  Administered 2022-06-09: 50 mg via INTRAVENOUS
  Administered 2022-06-09: 5 mg via INTRAVENOUS
  Administered 2022-06-09: 20 mg via INTRAVENOUS

## 2022-06-09 MED ORDER — HEMOSTATIC AGENTS (NO CHARGE) OPTIME
TOPICAL | Status: DC | PRN
  Administered 2022-06-09: 1 via TOPICAL

## 2022-06-09 MED ORDER — ORAL CARE MOUTH RINSE: 15.0000 mL | Freq: Once | OROMUCOSAL | Status: AC

## 2022-06-09 MED ORDER — ACETAMINOPHEN 160 MG/5ML PO SOLN: 1000.0000 mg | Freq: Four times a day (QID) | ORAL | Status: DC

## 2022-06-09 SURGICAL SUPPLY — 60 items
ADH SKN CLS APL DERMABOND .7 (GAUZE/BANDAGES/DRESSINGS) ×1
APL PRP STRL LF DISP 70% ISPRP (MISCELLANEOUS) ×1
BAG TISS RTRVL C300 12X14 (MISCELLANEOUS) ×1
BLADE STERNUM SYSTEM 6 (BLADE) ×2 IMPLANT
CHLORAPREP W/TINT 26 (MISCELLANEOUS) ×2 IMPLANT
CNTNR URN SCR LID CUP LEK RST (MISCELLANEOUS) ×8 IMPLANT
CONN ST 1/4X3/8  BEN (MISCELLANEOUS) ×1
CONN ST 1/4X3/8 BEN (MISCELLANEOUS) IMPLANT
CONT SPEC 4OZ STRL OR WHT (MISCELLANEOUS) ×4
DEFOGGER SCOPE WARMER CLEARIFY (MISCELLANEOUS) ×2 IMPLANT
DERMABOND ADVANCED .7 DNX12 (GAUZE/BANDAGES/DRESSINGS) ×2 IMPLANT
DRAIN CONNECTOR BLAKE 1:1 (MISCELLANEOUS) IMPLANT
DRAPE ARM DVNC X/XI (DISPOSABLE) ×8 IMPLANT
DRAPE COLUMN DVNC XI (DISPOSABLE) ×2 IMPLANT
DRAPE CV SPLIT W-CLR ANES SCRN (DRAPES) ×2 IMPLANT
DRAPE DA VINCI XI ARM (DISPOSABLE) ×4
DRAPE DA VINCI XI COLUMN (DISPOSABLE) ×1
DRAPE ORTHO SPLIT 77X108 STRL (DRAPES) ×1
DRAPE SURG ORHT 6 SPLT 77X108 (DRAPES) ×2 IMPLANT
ELECT REM PT RETURN 9FT ADLT (ELECTROSURGICAL) ×1
ELECTRODE REM PT RTRN 9FT ADLT (ELECTROSURGICAL) ×2 IMPLANT
GAUZE KITTNER 4X5 RF (MISCELLANEOUS) ×6 IMPLANT
GAUZE SPONGE 4X4 12PLY STRL (GAUZE/BANDAGES/DRESSINGS) IMPLANT
GLOVE BIO SURGEON STRL SZ7.5 (GLOVE) ×6 IMPLANT
GOWN STRL REUS W/ TWL LRG LVL3 (GOWN DISPOSABLE) ×2 IMPLANT
GOWN STRL REUS W/ TWL XL LVL3 (GOWN DISPOSABLE) ×4 IMPLANT
GOWN STRL REUS W/TWL 2XL LVL3 (GOWN DISPOSABLE) ×2 IMPLANT
GOWN STRL REUS W/TWL LRG LVL3 (GOWN DISPOSABLE) ×1
GOWN STRL REUS W/TWL XL LVL3 (GOWN DISPOSABLE) ×2
HEMOSTAT SURGICEL 2X14 (HEMOSTASIS) ×2 IMPLANT
NEEDLE HYPO 22GX1.5 SAFETY (NEEDLE) ×2 IMPLANT
PACK CHEST (CUSTOM PROCEDURE TRAY) ×2 IMPLANT
PAD ARMBOARD 7.5X6 YLW CONV (MISCELLANEOUS) ×4 IMPLANT
PAD ELECT DEFIB RADIOL ZOLL (MISCELLANEOUS) ×2 IMPLANT
RETRACTOR WOUND ALXS 19CM XSML (INSTRUMENTS) IMPLANT
RTRCTR WOUND ALEXIS 19CM XSML (INSTRUMENTS) ×1
SEAL CANN UNIV 5-8 DVNC XI (MISCELLANEOUS) ×8 IMPLANT
SEAL XI 5MM-8MM UNIVERSAL (MISCELLANEOUS) ×3
SEALER VESSEL DA VINCI XI (MISCELLANEOUS) ×1
SEALER VESSEL EXT DVNC XI (MISCELLANEOUS) IMPLANT
SET TRI-LUMEN FLTR TB AIRSEAL (TUBING) ×2 IMPLANT
SOLUTION ELECTROLUBE (MISCELLANEOUS) ×2 IMPLANT
SPONGE TONSIL 1 RF SGL (DISPOSABLE) ×2 IMPLANT
STAPLER CANNULA SEAL DVNC XI (STAPLE) IMPLANT
STAPLER CANNULA SEAL XI (STAPLE) ×1
SUT SILK  1 MH (SUTURE) ×1
SUT SILK 1 MH (SUTURE) ×2 IMPLANT
SUT VIC AB 2-0 CT1 27 (SUTURE) ×1
SUT VIC AB 2-0 CT1 TAPERPNT 27 (SUTURE) IMPLANT
SUT VIC AB 3-0 SH 27 (SUTURE) ×2
SUT VIC AB 3-0 SH 27X BRD (SUTURE) ×4 IMPLANT
SUT VICRYL 0 TIES 12 18 (SUTURE) ×2 IMPLANT
SUT VICRYL 0 UR6 27IN ABS (SUTURE) ×4 IMPLANT
SYR 20CC LL (SYRINGE) ×2 IMPLANT
SYSTEM RETRIEVAL ANCHOR 12 (MISCELLANEOUS) IMPLANT
SYSTEM SAHARA CHEST DRAIN ATS (WOUND CARE) IMPLANT
TAPE CLOTH SURG 4X10 WHT LF (GAUZE/BANDAGES/DRESSINGS) IMPLANT
TOWEL GREEN STERILE FF (TOWEL DISPOSABLE) IMPLANT
TRAY FOLEY SLVR 16FR TEMP STAT (SET/KITS/TRAYS/PACK) IMPLANT
TROCAR PORT AIRSEAL 8X120 (TROCAR) IMPLANT

## 2022-06-09 NOTE — Op Note (Signed)
      [address]       [place], [address]             [phone]        06/09/2022  Patient:  Caleb Cardenas Pre-Op Dx: Anterior mediastinal mass Post-op Dx: Same Procedure: - Robotic assisted left video thoracoscopy - Resection of anterior mediastinal mass - Intercostal nerve block  Surgeon and Role:      * Kelvis Berger, Lucile Crater, MD - Primary    *D. Tacy Dura, PA-C- assisting  Anesthesia  general EBL: 50 ml Blood Administration: None Specimen: Anterior mediastinal mass  Drains: 19 F chest tube in left chest Counts: correct   Indications: 66 year old male previously seen for an anterior mediastinal mass.  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  It was previously stable for over 5 years, but most recent imaging shows there has been some growth.  He did previously also have some mildly enlarged lymph nodes but never followed up with Dr. Valeta Harms for biopsy.  It also appears as though he never had his tumor markers drawn.  Agree to proceed with a left robotic thoracoscopy, and resection of anterior mediastinal mass.  He will require Plavix washout prior to surgery.   Findings: Mediastinal mass with evidence.  We were able to resect the off of the innominate vein.  The phrenic nerve was evident and not involved.  Operative Technique: After the risks, benefits and alternatives were thoroughly discussed, the patient was brought to the operative theatre.  Anesthesia was induced, and the patient was then placed in a right lazy lateral decubitus position and was prepped and draped in normal sterile fashion.  An appropriate surgical pause was performed, and pre-operative antibiotics were dosed accordingly.  We began by placing our 3 robotic ports in the the intercostal spaces targeting the pericardium.  A 20m assistant port was placed in the 9th intercostal space in the posterior axillary line.  The robot was then docked and  all instruments were passed under direct visualization.  The mass was evident anterior to the pericardium.  We began with a lateral to medial dissection taking care not to injure the phrenic nerve.  The dissection was carried posterior to the mass along the pericardium.  The right pleural space was opened.  We then continued to dissect all mediastinal subcutaneous tissue off the sternum.  We then continued our dissection into the neck and brought down the thymic horns.  The innominate vein was identified, and the tissue was carefully dissected away from it.  Meticulous hemostasis was obtained.    The mass was then placed in an endocatch bag, and removed from the assistant point site.  All instruments were removed, and the robot was undocked.  A 19 [place] drain was passed through right inferior robotic port into the thoracic space.  An intercostal nerve block was performed under direct visualization.  The skin and soft tissue were closed with absorbable suture    The patient tolerated the procedure without any immediate complications, and was transferred to the PACU in stable condition.  Myeasha Ballowe OBary Leriche

## 2022-06-09 NOTE — Anesthesia Postprocedure Evaluation (Signed)
Anesthesia Post Note  Patient: Caleb Cardenas  Procedure(s) Performed: XI ROBOTIC ASSISTED RESECTION OF MEDIASTINAL MASS (Left)     Patient location during evaluation: PACU Anesthesia Type: General Level of consciousness: awake Pain management: pain level controlled Vital Signs Assessment: post-procedure vital signs reviewed and stable Respiratory status: spontaneous breathing Cardiovascular status: stable Postop Assessment: no apparent nausea or vomiting Anesthetic complications: no   No notable events documented.  Last Vitals:  Vitals:   06/09/22 1315 06/09/22 1330  BP: 112/70 122/77  Pulse: (!) 55 (!) 55  Resp: 10 11  Temp:  (!) 36.2 C  SpO2: 96% 93%    Last Pain:  Vitals:   06/09/22 1330  TempSrc:   PainSc: Asleep                 Shirelle Tootle

## 2022-06-09 NOTE — Hospital Course (Addendum)
HPI: This is a 66 y.o. 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  He was previously seen for this at the beginning of 2023, and at the time was noted to have large mass along with some hilar adenopathy.  He originally was scheduled to follow-up with Dr. Valeta Harms for endobronchial biopsy of the adenopathy but did not follow-up.  Repeat imaging shows regression of the adenopathy.  He has been asymptomatic since his last appointment. Dr. Kipp Brood discussed the need for [facility] robotic assisted left thoracoscopy, resection of anterior mediastinal mass. Potential risks, complications, and benefits of the surgery were discussed with the patient and he agreed to proceed with surgery.  Hospital Course: Patient underwent a Xi robotic assisted left thoracoscopy, resection of anterior mediastinal mass. He was extubated after surgery and transported from the OR to PACU in stable condition. Chest tube was to water seal. There was no air leak. CXR showed no pneumothorax. Chest tube was removed on 12/22. Creatinine on post op day one was up to 2.19. 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 Patient instructed to avoid NSAIDs and other nephrotoxic agents. Creatinine 12/23 decreased slightly to 2.11.  Patient given a prescription to have BMET checked next week and will ask to fax results to [place] office. PA/LAT CXR 12/23 showed no pneumothorax.  He is hypertensive but with creatinine still elevated, will not restart Lisinopril/HCTZ. He was put on Amlodipine and has already been restarted on Toprol XL 25 mg daily. All wounds are clean, dry, healing without signs of infection. Patient is ambulating on room air with good oxygenation. Patient is tolerating a diet. He is felt stable for discharge today. Patient instructed to not take NSAIDs because of elevated creatinine.

## 2022-06-09 NOTE — Transfer of Care (Signed)
Immediate Anesthesia Transfer of Care Note  Patient: Caleb Cardenas  Procedure(s) Performed: XI ROBOTIC ASSISTED RESECTION OF MEDIASTINAL MASS (Left)  Patient Location: PACU  Anesthesia Type:General  Level of Consciousness: drowsy  Airway & Oxygen Therapy: Patient Spontanous Breathing and Patient connected to face mask oxygen  Post-op Assessment: Report given to RN and Post -op Vital signs reviewed and stable  Post vital signs: Reviewed and stable  Last Vitals:  Vitals Value Taken Time  BP 149/70 06/09/22 1023  Temp    Pulse 61 06/09/22 1024  Resp 15 06/09/22 1024  SpO2 94 % 06/09/22 1024  Vitals shown include unvalidated device data.  Last Pain:  Vitals:   06/09/22 0601  TempSrc:   PainSc: 0-No pain      Patients Stated Pain Goal: 0 (16/10/96 0454)  Complications: No notable events documented.

## 2022-06-09 NOTE — Anesthesia Procedure Notes (Signed)
Procedure Name: Intubation Date/Time: 06/09/2022 7:45 AM  Performed by: Colin Benton, CRNAPre-anesthesia Checklist: Patient identified, Emergency Drugs available, Suction available and Patient being monitored Patient Re-evaluated:Patient Re-evaluated prior to induction Oxygen Delivery Method: Circle system utilized Preoxygenation: Pre-oxygenation with 100% oxygen Induction Type: IV induction Ventilation: Mask ventilation without difficulty Laryngoscope Size: Mac and 3 Grade View: Grade II Endobronchial tube: Left, Double lumen EBT, EBT position confirmed by auscultation and EBT position confirmed by fiberoptic bronchoscope and 39 Fr Number of attempts: 1 Airway Equipment and Method: Stylet Placement Confirmation: ETT inserted through vocal cords under direct vision, positive ETCO2 and breath sounds checked- equal and bilateral Tube secured with: Tape Dental Injury: Teeth and Oropharynx as per pre-operative assessment

## 2022-06-09 NOTE — H&P (Signed)
[address]       [place] [address]             [phone]                                                   Kodey L Bibian [place] Medical Record #[id number] Date of Birth: [birth year]   Referring: Garner Nash, DO Primary Care: [facility] Primary Cardiologist: None   Chief Complaint:   No chief complaint on file.     History of Present Illness:    Caleb Cardenas 66 y.o. 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  He was previously seen for this at the beginning of 2023, and at the time was noted to have large mass along with some hilar adenopathy.  He originally was scheduled to follow-up with Dr. Valeta Harms for endobronchial biopsy of the adenopathy but did not follow-up.  Repeat imaging shows regression of the adenopathy.  He has been asymptomatic since his last appointment.         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 03/14/2022 [place]    Social History        Substance and Sexual Activity  Alcohol Use Yes    Comment: occ        No Known Allergies         Current Outpatient Medications  Medication Sig Dispense Refill   atorvastatin (LIPITOR) 80 MG tablet Take 1 tablet (80 mg total) by mouth at bedtime. 90 tablet 4   clopidogrel (PLAVIX) 75 MG tablet Take 1 tablet (75 mg total) by mouth daily. 90 tablet 4   lisinopril (ZESTRIL) 10 MG  tablet Take 1 tablet (10 mg total) by mouth at bedtime. 30 tablet 1   metoprolol succinate (TOPROL XL) 25 MG 24 hr tablet Take 1 tablet (25 mg total) by mouth daily. 90 tablet 3   Multiple Vitamin (MULTIVITAMIN ADULT) TABS Take 1 tablet by mouth 3 (three) times a week.       Omega 3 1000 MG CAPS Take 1,000 mg by mouth daily as needed (when not eating vegetables).        No current facility-administered medications for this visit.      Review of Systems  Constitutional: Negative.   Respiratory: Negative.    Cardiovascular: Negative.       PHYSICAL EXAMINATION: There were no vitals taken for this visit.  Physical Exam Constitutional:      Appearance: He is normal weight.  HENT:     Head: Normocephalic and atraumatic.  Eyes:     Extraocular Movements: Extraocular movements intact.  Cardiovascular:     Rate and Rhythm: Normal rate.  Abdominal:     General: Abdomen is flat. There is no distension.  Musculoskeletal:        General: Normal range of motion.     Cervical back: Normal range of motion.  Skin:    General: Skin is warm and dry.  Neurological:     General: No focal deficit present.     Mental Status: He is alert and oriented to person, place, and time.        Diagnostic Studies & Laboratory data:     Recent Radiology Findings:    Imaging Results  CUP PACEART REMOTE DEVICE CHECK   Result Date: 04/05/2022 ILR summary report received. Battery status OK. Normal device function. No new symptom, tachy, brady, or pause episodes. No new AF episodes. Monthly summary reports and ROV/PRN LA          I have independently reviewed the above radiology studies  and reviewed the findings with the patient.    Recent Lab Findings: Recent Labs       Lab Results  Component Value Date    WBC 10.0 04/20/2021    HGB 15.2 04/20/2021    HCT 44.8 04/20/2021    PLT 215 04/20/2021    GLUCOSE 101 (H) 04/20/2021    CHOL 199 04/21/2020    TRIG 621 (HH) 04/21/2020    HDL 25 (L)  04/21/2020    LDLCALC 76 04/21/2020    ALT 21 04/19/2021    AST 23 04/19/2021    NA 137 04/20/2021    K 4.5 04/20/2021    CL 107 04/20/2021    CREATININE 1.48 (H) 04/20/2021    BUN 24 (H) 04/20/2021    CO2 24 04/20/2021    INR 1.0 04/19/2021    HGBA1C 6.0 (H) 04/21/2020              Problem List:   Mediastinum/Nodes: Anterior mediastinal mass measuring 4.0 x 2.7 cm transversely, 3.0 cm superior to inferior. Mass measured 4.2 x 2.7 cm transversely on the prior PET-CT 3.4 x 2.0 cm transversely by 2.4 cm superior to inferior on the CT dated 10/06/2015.      Assessment / Plan:   66 year old male previously seen for an anterior mediastinal mass.  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  It was previously stable for over 5 years, but most recent imaging shows there has been some growth.  He did previously also have some mildly enlarged lymph nodes but never followed up with Dr. Valeta Harms for biopsy.  It also appears as though he never had his tumor markers drawn.  Agree to proceed with a left robotic thoracoscopy, and resection of anterior mediastinal mass.  He will require Plavix washout prior to surgery.

## 2022-06-09 NOTE — Brief Op Note (Signed)
06/09/2022  9:54 AM  PATIENT:  Caleb Cardenas  66 y.o. male  PRE-OPERATIVE DIAGNOSIS:  ANTERIOR MEDIASTINAL MASS  POST-OPERATIVE DIAGNOSIS:  ANTERIOR MEDIASTINAL MASS  PROCEDURE:  XI ROBOTIC ASSISTED LEFT THORACOSCOPY, RESECTION OF ANTERIOR MEDIASTINAL MASS   SURGEON:  Surgeon(s) and Role:    Lightfoot, Lucile Crater, MD - Primary  PHYSICIAN ASSISTANT: Lars Pinks PA-C  ANESTHESIA:   general  EBL:  50 mL   BLOOD ADMINISTERED:none  DRAINS:  Blake drain placed in the left pleural space    LOCAL MEDICATIONS USED:  OTHER Exparel  SPECIMEN:  Source of Specimen:  Anterior mediastinal mass  DISPOSITION OF SPECIMEN:  PATHOLOGY  COUNTS CORRECT:  YES  DICTATION: .Dragon Dictation  PLAN OF CARE: Admit to inpatient   PATIENT DISPOSITION:  PACU - hemodynamically stable.   Delay start of Pharmacological VTE agent (>24hrs) due to surgical blood loss or risk of bleeding: no

## 2022-06-09 NOTE — Discharge Summary (Signed)
[address]       ,[place] [address]             [phone]    Physician Discharge Summary  Patient ID: Caleb Cardenas MRN: [id number] DOB/AGE: Jan 30, 1956 66 y.o.  Admit date: 06/09/2022 Discharge date: 06/11/2022  Admission Diagnoses:  Patient Active Problem List   Diagnosis Date Noted   Mediastinal mass 06/09/2022   Vertebrobasilar insufficiency    Hypertensive urgency 04/19/2021   Dizziness 04/19/2021   CKD (chronic kidney disease) stage 3, GFR 30-59 ml/min ([place]) 04/19/2021   Mass of mediastinum      Discharge Diagnoses:  Patient Active Problem List   Diagnosis Date Noted   Mediastinal mass 06/09/2022   Vertebrobasilar insufficiency    Hypertensive urgency 04/19/2021   Dizziness 04/19/2021   CKD (chronic kidney disease) stage 3, GFR 30-59 ml/min ([place]) 04/19/2021   Mass of mediastinum      Discharged Condition: stable  HPI: This is a 66 y.o. 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  He was previously seen for this at the beginning of 2023, and at the time was noted to have large mass along with some hilar adenopathy.  He originally was scheduled to follow-up with Dr. Valeta Harms for endobronchial biopsy of the adenopathy but did not follow-up.  Repeat imaging shows regression of the adenopathy.  He has been asymptomatic since his last appointment. Dr. Kipp Brood discussed the need for [facility] robotic assisted left thoracoscopy, resection of anterior mediastinal mass. Potential risks, complications, and benefits of the surgery were discussed with the patient and he agreed to proceed with surgery.  Hospital Course: Patient underwent a Xi robotic assisted left thoracoscopy, resection of anterior mediastinal mass. He was extubated after surgery and transported from the OR to PACU in stable condition. Chest tube was to water seal. There was no air leak. CXR showed no pneumothorax. Chest tube was removed on 12/22. Creatinine on  post op day one was up to 2.19. 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 Patient instructed to avoid NSAIDs and other nephrotoxic agents. Creatinine 12/23 decreased slightly to 2.11.  Patient given a prescription to have BMET checked next week and will ask to fax results to [place] office. PA/LAT CXR 12/23 showed no pneumothorax.  He is hypertensive but with creatinine still elevated, will not restart Lisinopril/HCTZ. He was put on Amlodipine and has already been restarted on Toprol XL 25 mg daily. All wounds are clean, dry, healing without signs of infection. Patient is ambulating on room air with good oxygenation. Patient is tolerating a diet. He is felt stable for discharge today. Patient instructed to not take NSAIDs because of elevated creatinine.  Consults: None  Significant Diagnostic Studies:   Narrative & Impression  CLINICAL DATA:  [id number] Pneumothorax [id number]   EXAM: CHEST - 2 VIEW   COMPARISON:  06/10/2022   FINDINGS: Left-sided chest tube is removed. There is linear subsegmental atelectasis identified medial left base. No definite recurrent pneumothorax identified. There is mild pulmonary vascular congestion without focal consolidation. Aorta calcified. Implanted cardiac monitor noted.   IMPRESSION: Left base subsegmental atelectasis. Pulmonary vascular congestion. No pneumothorax identified after chest tube removal.     Electronically Signed   By: Sammie Bench M.D.   On: 06/11/2022 11:00     Narrative & Impression  CLINICAL DATA:  Pneumothorax   EXAM: PORTABLE CHEST 1  VIEW   COMPARISON:  CXR 06/07/22   FINDINGS: Postsurgical changes from interval reported cardiothoracic surgery. Left-sided wireless cardiac device in place. Interval placement of a left-sided thoracostomy tube. No pleural effusion. Possible small left apical pneumothorax. Low lung volumes. Cardiac and mediastinal contours are  likely unchanged when accounting for differences in lung volumes. No displaced rib fractures. Visualized upper abdomen is unremarkable.   IMPRESSION: Interval placement of a left-sided thoracostomy tube. Possible small left apical pneumothorax.     Electronically Signed   By: Marin Roberts M.D.   On: 06/09/2022 10:58      Treatments: surgery:  Robotic assisted left thoracoscopy, resection of anterior mediastinal mass, and intercostal nerve block by Dr. Kipp Brood on 06/09/2022.  Pathology: Final results pending  Discharge Exam: Blood pressure (!) 177/74, pulse (!) 54, temperature (!) 97.4 F (36.3 C), temperature source Oral, resp. rate 19, height '5\' 10"'$  (1.778 m), weight 74 kg, SpO2 97 %. Cardiovascular: RRR Pulmonary: Clear to auscultation bilaterally Abdomen: Soft, non tender, bowel sounds present. Extremities: No LE edema Wounds: Clean and dry.  No erythema or signs of infection.    Discharge Medications:  Allergies as of 06/11/2022   No Known Allergies      Medication List     STOP taking these medications    lisinopril-hydrochlorothiazide 10-12.5 MG tablet Commonly known as: ZESTORETIC       TAKE these medications    amLODipine 10 MG tablet Commonly known as: NORVASC Take 1 tablet (10 mg total) by mouth daily.   atorvastatin 80 MG tablet Commonly known as: LIPITOR Take 1 tablet (80 mg total) by mouth at bedtime.   clopidogrel 75 MG tablet Commonly known as: PLAVIX Take 1 tablet (75 mg total) by mouth daily.   metoprolol succinate 25 MG 24 hr tablet Commonly known as: Toprol XL Take 1 tablet (25 mg total) by mouth daily.   Multivitamin Adult Tabs Take 1 tablet by mouth 3 (three) times a week.   Omega 3 1000 MG Caps Take 1,000 mg by mouth once a week.   oxyCODONE 5 MG immediate release tablet Commonly known as: Oxy IR/ROXICODONE Take 1 tablet (5 mg total) by mouth every 6 (six) hours as needed for severe pain.        Follow-up  Information     Lajuana Matte, MD. Go on 06/17/2022.   Specialty: Cardiothoracic Surgery Why: Appointiment time is at 11:50 am Contact information: [address] [place] [address] [place], [place]. Call.   Why: for follow up regarding blood pressure management and surveillance of creatinine (2.11 day of discharge, has ckd) Contact information: [place] [address] [phone]                 Signed:  Nani Skillern, PA-C 06/11/2022, 12:50 PM

## 2022-06-10 ENCOUNTER — Inpatient Hospital Stay (HOSPITAL_COMMUNITY): Payer: Medicare HMO

## 2022-06-10 LAB — CBC
HCT: 35.3 % — ABNORMAL LOW (ref 39.0–52.0)
Hemoglobin: 12 g/dL — ABNORMAL LOW (ref 13.0–17.0)
MCH: 30.7 pg (ref 26.0–34.0)
MCHC: 34 g/dL (ref 30.0–36.0)
MCV: 90.3 fL (ref 80.0–100.0)
Platelets: 132 10*3/uL — ABNORMAL LOW (ref 150–400)
RBC: 3.91 MIL/uL — ABNORMAL LOW (ref 4.22–5.81)
RDW: 14.6 % (ref 11.5–15.5)
WBC: 8.5 10*3/uL (ref 4.0–10.5)
nRBC: 0 % (ref 0.0–0.2)

## 2022-06-10 LAB — BASIC METABOLIC PANEL
Anion gap: 10 (ref 5–15)
BUN: 44 mg/dL — ABNORMAL HIGH (ref 8–23)
CO2: 22 mmol/L (ref 22–32)
Calcium: 7.8 mg/dL — ABNORMAL LOW (ref 8.9–10.3)
Chloride: 104 mmol/L (ref 98–111)
Creatinine, Ser: 2.19 mg/dL — ABNORMAL HIGH (ref 0.61–1.24)
GFR, Estimated: 32 mL/min — ABNORMAL LOW (ref >60)
Glucose, Bld: 150 mg/dL — ABNORMAL HIGH (ref 70–99)
Potassium: 3.6 mmol/L (ref 3.5–5.1)
Sodium: 136 mmol/L (ref 135–145)

## 2022-06-10 MED ORDER — TRAMADOL HCL 50 MG PO TABS: 50.0000 mg | ORAL_TABLET | Freq: Two times a day (BID) | ORAL | Status: DC | PRN

## 2022-06-10 MED ORDER — CLOPIDOGREL BISULFATE 75 MG PO TABS
75.0000 mg | ORAL_TABLET | Freq: Every day | ORAL | Status: DC
  Administered 2022-06-10 – 2022-06-11 (×2): 75 mg via ORAL
  Filled 2022-06-10 (×2): qty 1

## 2022-06-10 MED ORDER — POTASSIUM CHLORIDE CRYS ER 20 MEQ PO TBCR
20.0000 meq | EXTENDED_RELEASE_TABLET | Freq: Once | ORAL | Status: AC
  Administered 2022-06-10: 20 meq via ORAL
  Filled 2022-06-10: qty 1

## 2022-06-10 NOTE — Plan of Care (Signed)

## 2022-06-10 NOTE — Discharge Instructions (Signed)
Robot-Assisted Thoracic Surgery, Care After The following information offers guidance on how to care for yourself after your procedure. Your health care provider may also give you more specific instructions. If you have problems or questions, contact your health care provider. What can I expect after the procedure? After the procedure, it is common to have: Some pain and aches in the area of your surgical incisions. Pain when breathing in (inhaling) and coughing. Tiredness (fatigue). Trouble sleeping. Constipation. Follow these instructions at home: Medicines Take over-the-counter and prescription medicines only as told by your health care provider. If you were prescribed an antibiotic medicine, take it as told by your health care provider. Do not stop taking the antibiotic even if you start to feel better. Talk with your health care provider about safe and effective ways to manage pain after your procedure. Pain management should fit your specific health needs. Take pain medicine before pain becomes severe. Relieving and controlling your pain will make breathing easier for you. Ask your health care provider if the medicine prescribed to you requires you to avoid driving or using machinery. Eating and drinking Follow instructions from your health care provider about eating or drinking restrictions. These will vary depending on what procedure you had. Your health care provider may recommend: A liquid diet or soft diet for the first few days. Meals that are smaller and more frequent. A diet of fruits, vegetables, whole grains, and low-fat proteins. Limiting foods that are high in fat and processed sugar, including fried or sweet foods. Incision care Follow instructions from your health care provider about how to take care of your incisions. Make sure you: Wash your hands with soap and water for at least 20 seconds before and after you change your bandage (dressing). If soap and water are not  available, use hand sanitizer. Change your dressing as told by your health care provider. Leave stitches (sutures), skin glue, or adhesive strips in place. These skin closures may need to stay in place for 2 weeks or longer. If adhesive strip edges start to loosen and curl up, you may trim the loose edges. Do not remove adhesive strips completely unless your health care provider tells you to do that. Check your incision area every day for signs of infection. Check for: Redness, swelling, or more pain. Fluid or blood. Warmth. Pus or a bad smell. Activity Return to your normal activities as told by your health care provider. Ask your health care provider what activities are safe for you. Ask your health care provider when it is safe for you to drive. Do not lift anything that is heavier than 10 lb (4.5 kg), or the limit that you are told, until your health care provider says that it is safe. Rest as told by your health care provider. Avoid sitting for a long time without moving. Get up to take short walks every 1-2 hours. This is important to improve blood flow and breathing. Ask for help if you feel weak or unsteady. Do exercises as told by your health care provider. Pneumonia prevention  Do deep breathing exercises and cough regularly as directed. This helps clear mucus and opens your lungs. Doing this helps prevent lung infection (pneumonia). If you were given an incentive spirometer, use it as told. An incentive spirometer is a tool that measures how well you are filling your lungs with each breath. Coughing may hurt less if you try to support your chest. This is called splinting. Try one of these when you   cough: Hold a pillow against your chest. Place the palms of both hands on top of your incision area. Do not use any products that contain nicotine or tobacco. These products include cigarettes, chewing tobacco, and vaping devices, such as e-cigarettes. If you need help quitting, ask your  health care provider. Avoid secondhand smoke. General instructions If you have a drainage tube: Follow instructions from your health care provider about how to take care of it. Do not travel by airplane after your tube is removed until your health care provider tells you it is safe. You may need to take these actions to prevent or treat constipation: Drink enough fluid to keep your urine pale yellow. Take over-the-counter or prescription medicines. Eat foods that are high in fiber, such as beans, whole grains, and fresh fruits and vegetables. Limit foods that are high in fat and processed sugars, such as fried or sweet foods. Keep all follow-up visits. This is important. Contact a health care provider if: You have redness, swelling, or more pain around an incision. You have fluid or blood coming from an incision. An incision feels warm to the touch. You have pus or a bad smell coming from an incision. You have a fever. You cannot eat or drink without vomiting. Your pain medicine is not controlling your pain. Get help right away if: You have chest pain. Your heart is beating quickly. You have trouble breathing. You have trouble speaking. You are confused. You feel weak or dizzy, or you faint. These symptoms may represent a serious problem that is an emergency. Do not wait to see if the symptoms will go away. Get medical help right away. Call your local emergency services (911 in the [place] Do not drive yourself to the hospital. Summary Talk with your health care provider about safe and effective ways to manage pain after your procedure. Pain management should fit your specific health needs. Return to your normal activities as told by your health care provider. Ask your health care provider what activities are safe for you. Do deep breathing exercises and cough regularly as directed. This helps to clear mucus and prevent pneumonia. If it hurts to cough, ease pain by holding a pillow  against your chest or by placing the palms of both hands over your incisions. This information is not intended to replace advice given to you by your health care provider. Make sure you discuss any questions you have with your health care provider. Document Revised: 02/28/2020 Document Reviewed: 02/28/2020 Elsevier Patient Education  2023 [employer] Inc[employer]   

## 2022-06-10 NOTE — Progress Notes (Addendum)
      [address]       [place] [address]             [phone]       1 Day Post-Op Procedure(s) (LRB): XI ROBOTIC ASSISTED RESECTION OF MEDIASTINAL MASS (Left)  Subjective: Patient without specific complaint this am. He wants to go home.  Objective: Vital signs in last 24 hours: Temp:  [97 F (36.1 C)-97.8 F (36.6 C)] 97.6 F (36.4 C) (12/22 0750) Pulse Rate:  [51-63] 51 (12/22 0750) Cardiac Rhythm: Sinus bradycardia;Heart block (12/22 0700) Resp:  [10-19] 15 (12/22 0750) BP: (96-160)/(58-86) 155/68 (12/22 0750) SpO2:  [90 %-96 %] 91 % (12/22 0750)     Intake/Output from previous day: 12/21 0701 - 12/22 0700 In: 1640 [P.O.:240; I.V.:1100; IV Piggyback:300] Out: 750 [Urine:280; Blood:50; Chest Tube:420]   Physical Exam:  Cardiovascular: RRR Pulmonary: Clear to auscultation bilaterally Abdomen: Soft, non tender, bowel sounds present. Extremities: SCDs in place Wounds: Clean and dry.  No erythema or signs of infection. Chest Tube:to water seal, no air leak  Lab Results: CBC: Recent Labs    06/07/22 1400 06/10/22 0018  WBC 7.3 8.5  HGB 15.2 12.0*  HCT 46.5 35.3*  PLT 149* 132*   BMET:  Recent Labs    06/07/22 1400 06/10/22 0018  NA 136 136  K 3.7 3.6  CL 104 104  CO2 25 22  GLUCOSE 104* 150*  BUN 30* 44*  CREATININE 1.88* 2.19*  CALCIUM 8.3* 7.8*    PT/INR:  Recent Labs    06/07/22 1400  LABPROT 13.1  INR 1.0   ABG:  INR: Will add last result for INR, ABG once components are confirmed Will add last 4 CBG results once components are confirmed  Assessment/Plan:  1. CV - SB at times. History of stroke so as discussed with Dr. Kipp Brood, will restart Plavix 2.  Pulmonary - Chest tube with 420 cc of output since surgery. Chest tube is to water seal and there is no air leak. CXR this am appears to show trace left apical pneumothorax . Per Dr. Kipp Brood, remove chest tube. Encourage incentive spirometer.Await final  pathology 3. Gently supplement potassium 4. Creatinine increased from 1.88 to 2.19. Will stop Toradol, avoid nephrotoxic agents,  and monitor Chronic Kidney Disease   Stage I     GFR >90  Stage II    GFR 60-89  Stage IIIA GFR 45-59  Stage IIIB GFR 30-44  Stage IV   GFR 15-29  Stage V    GFR  <15  Lab Results  Component Value Date   CREATININE 2.19 (H) 06/10/2022   Estimated Creatinine Clearance: 33.8 mL/min (A) (by C-G formula based on SCr of 2.19 mg/dL (H)). 5. On Lovenox for DVT prophylaxis 6. When CXR stable and creatinine closer to normal, will discharge 1-2 days  Donielle M ZimmermanPA-C 06/10/2022,8:34 AM  Agree with above We will remove chest tube today. Watch creatinine. Home likely tomorrow.  Fedrick Cefalu Bary Leriche

## 2022-06-11 ENCOUNTER — Inpatient Hospital Stay (HOSPITAL_COMMUNITY): Payer: Medicare HMO

## 2022-06-11 LAB — COMPREHENSIVE METABOLIC PANEL
ALT: 8 U/L (ref 0–44)
AST: 28 U/L (ref 15–41)
Albumin: 2.9 g/dL — ABNORMAL LOW (ref 3.5–5.0)
Alkaline Phosphatase: 73 U/L (ref 38–126)
Anion gap: 7 (ref 5–15)
BUN: 47 mg/dL — ABNORMAL HIGH (ref 8–23)
CO2: 21 mmol/L — ABNORMAL LOW (ref 22–32)
Calcium: 8 mg/dL — ABNORMAL LOW (ref 8.9–10.3)
Chloride: 109 mmol/L (ref 98–111)
Creatinine, Ser: 2.11 mg/dL — ABNORMAL HIGH (ref 0.61–1.24)
GFR, Estimated: 34 mL/min — ABNORMAL LOW (ref >60)
Glucose, Bld: 91 mg/dL (ref 70–99)
Potassium: 3.5 mmol/L (ref 3.5–5.1)
Sodium: 137 mmol/L (ref 135–145)
Total Bilirubin: 0.7 mg/dL (ref 0.3–1.2)
Total Protein: 5.9 g/dL — ABNORMAL LOW (ref 6.5–8.1)

## 2022-06-11 LAB — CBC
HCT: 33.9 % — ABNORMAL LOW (ref 39.0–52.0)
Hemoglobin: 11.6 g/dL — ABNORMAL LOW (ref 13.0–17.0)
MCH: 30.5 pg (ref 26.0–34.0)
MCHC: 34.2 g/dL (ref 30.0–36.0)
MCV: 89.2 fL (ref 80.0–100.0)
Platelets: 144 10*3/uL — ABNORMAL LOW (ref 150–400)
RBC: 3.8 MIL/uL — ABNORMAL LOW (ref 4.22–5.81)
RDW: 14.3 % (ref 11.5–15.5)
WBC: 9 10*3/uL (ref 4.0–10.5)
nRBC: 0 % (ref 0.0–0.2)

## 2022-06-11 MED ORDER — AMLODIPINE BESYLATE 10 MG PO TABS
10.0000 mg | ORAL_TABLET | Freq: Every day | ORAL | Status: DC
  Administered 2022-06-11: 10 mg via ORAL
  Filled 2022-06-11: qty 1

## 2022-06-11 MED ORDER — OXYCODONE HCL 5 MG PO TABS: 5.0000 mg | ORAL_TABLET | Freq: Four times a day (QID) | ORAL | 0 refills | Status: DC | PRN

## 2022-06-11 MED ORDER — AMLODIPINE BESYLATE 10 MG PO TABS: 10.0000 mg | ORAL_TABLET | Freq: Every day | ORAL | 1 refills | Status: DC

## 2022-06-11 NOTE — Progress Notes (Signed)
      [address]       [place] [address]             [phone]       2 Days Post-Op Procedure(s) (LRB): XI ROBOTIC ASSISTED RESECTION OF MEDIASTINAL MASS (Left)  Subjective: Patient without specific complaint this am. He wants to go home.  Objective: Vital signs in last 24 hours: Temp:  [97.3 F (36.3 C)-97.9 F (36.6 C)] 97.7 F (36.5 C) (12/23 0741) Pulse Rate:  [53-62] 55 (12/23 0929) Cardiac Rhythm: Normal sinus rhythm (12/23 0700) Resp:  [15-22] 21 (12/23 0746) BP: (149-180)/(61-78) 163/78 (12/23 0929) SpO2:  [89 %-94 %] 94 % (12/23 0746)     Intake/Output from previous day: 12/22 0701 - 12/23 0700 In: -  Out: 30 [Chest Tube:30]   Physical Exam:  Cardiovascular: RRR Pulmonary: Clear to auscultation bilaterally Abdomen: Soft, non tender, bowel sounds present. Extremities: SCDs in place Wounds: Clean and dry.  No erythema or signs of infection.   Lab Results: CBC: Recent Labs    06/10/22 0018 06/11/22 0027  WBC 8.5 9.0  HGB 12.0* 11.6*  HCT 35.3* 33.9*  PLT 132* 144*    BMET:  Recent Labs    06/10/22 0018 06/11/22 0125  NA 136 137  K 3.6 3.5  CL 104 109  CO2 22 21*  GLUCOSE 150* 91  BUN 44* 47*  CREATININE 2.19* 2.11*  CALCIUM 7.8* 8.0*     PT/INR:  No results for input(s): "LABPROT", "INR" in the last 72 hours.  ABG:  INR: Will add last result for INR, ABG once components are confirmed Will add last 4 CBG results once components are confirmed  Assessment/Plan:  1. CV - SB at times. History of stroke so already restarted on Plavix. Hypertensive. On Lisinopril/HCTZ prior to surgery but will not restart secondary to elevated creatinine. Will start Amlodipine and have him follow up with medical doctor. 2.  Pulmonary - On room air. CXR this am appears stable.  Encourage incentive spirometer.Await final pathology 3. Gently supplement potassium 4. Creatinine slightly decreased from 2.19 to 2.11.Will stop Toradol,  avoid nephrotoxic agents,  and monitor Chronic Kidney Disease   Stage I     GFR >90  Stage II    GFR 60-89  Stage IIIA GFR 45-59  Stage IIIB GFR 30-44  Stage IV   GFR 15-29  Stage V    GFR  <15  Lab Results  Component Value Date   CREATININE 2.11 (H) 06/11/2022   Estimated Creatinine Clearance: 35.1 mL/min (A) (by C-G formula based on SCr of 2.11 mg/dL (H)). 5. On Lovenox for DVT prophylaxis 6. Patient's wants to be discharged. Will check BMET next week and get results faxed to Dr. Abran Duke office  Adaja Wander M ZimmermanPA-C 06/11/2022,10:34 AM

## 2022-06-14 LAB — SURGICAL PATHOLOGY

## 2022-06-15 ENCOUNTER — Ambulatory Visit (INDEPENDENT_AMBULATORY_CARE_PROVIDER_SITE_OTHER): Payer: Medicare HMO

## 2022-06-15 DIAGNOSIS — I639 Cerebral infarction, unspecified: Secondary | ICD-10-CM

## 2022-06-15 LAB — CUP PACEART REMOTE DEVICE CHECK: Date Time Interrogation Session: 20231226230644

## 2022-06-17 ENCOUNTER — Ambulatory Visit (INDEPENDENT_AMBULATORY_CARE_PROVIDER_SITE_OTHER): Payer: Self-pay | Admitting: Thoracic Surgery (Cardiothoracic Vascular Surgery)

## 2022-06-17 ENCOUNTER — Encounter: Payer: Self-pay | Admitting: Thoracic Surgery (Cardiothoracic Vascular Surgery)

## 2022-06-17 VITALS — BP 130/65 | HR 65 | Resp 20 | Ht 70.0 in | Wt 165.0 lb

## 2022-06-17 DIAGNOSIS — J9859 Other diseases of mediastinum, not elsewhere classified: Secondary | ICD-10-CM

## 2022-06-17 NOTE — Progress Notes (Signed)
Carelink Summary Report / Loop Recorder 

## 2022-06-18 ENCOUNTER — Other Ambulatory Visit: Payer: Self-pay | Admitting: Diagnostic Neuroimaging

## 2022-06-19 ENCOUNTER — Other Ambulatory Visit: Payer: Self-pay | Admitting: Student

## 2022-06-19 DIAGNOSIS — I4729 Other ventricular tachycardia: Secondary | ICD-10-CM

## 2022-06-21 NOTE — Progress Notes (Signed)
      [address]       [place] [address]             [phone]        Del[place] L [facility] [place] Medical Record #[id number] Date of Birth: 05/27/1956  Referring: Garner Nash, DO Primary Care: [facility] Primary Cardiologist:None  Reason for visit:   follow-up  History of Present Illness:     67 year old male presents for his first follow-up appointment after undergoing robotic assisted mediastinal mass resection.  Overall he is doing well.  He denies any pain or shortness of breath.  Physical Exam: BP 130/65 (BP Location: Right Arm, Patient Position: Sitting, Cuff Size: Normal)   Pulse 65   Resp 20   Ht '5\' 10"'$  (1.778 m)   Wt 165 lb (74.8 kg)   SpO2 94%   BMI 23.68 kg/m   Alert NAD Incision clean Abdomen, ND No peripheral edema   Diagnostic Studies & Laboratory data:  Path: A. 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  Referred him to radiation oncology.  He will follow-up in 1 month with a chest x-ray.   Lajuana Matte 06/21/2022 10:21 AM

## 2022-06-23 NOTE — Progress Notes (Addendum)
Thoracic Location of Tumor / Histology: Anterior Mediastinal Mass  06/10/2022 Lars Pinks, PA-C DG Chest 2 View CLINICAL DATA:  [id number] Pneumothorax [id number]   IMPRESSION: Left base subsegmental atelectasis. Pulmonary vascular congestion. No pneumothorax identified after chest tube removal.  06/10/2022 Lars Pinks, PA-C DG Chest Port 1 View CLINICAL DATA:  Pneumothorax  IMPRESSION: No definite pneumothorax status post left-sided chest tube removal.  06/10/2022 Lars Pinks, PA-C DG Chest Port 1 View CLINICAL DATA:  Pneumothorax.   IMPRESSION: Minimal left midlung subsegmental atelectasis or scarring. Stable left-sided chest tube without definite pneumothorax.    06/09/2022 Lars Pinks, PA-C DG Chest Port 1 View CLINICAL DATA:  Pneumothorax  IMPRESSION: Interval placement of a left-sided thoracostomy tube. Possible small left apical pneumothorax.  06/09/2022 DG Chest 2 Views Dr. Kipp Brood CLINICAL DATA:  [id number] Pre-op exam [phone]  FINDINGS: Wireless cardiac device overlies left chest.   The heart and mediastinal contours are within normal limits.  Prominent right hilar vasculature due to patient rotation.  No focal consolidation. No pulmonary edema. No pleural effusion. No pneumothorax.  No acute osseous abnormality.   IMPRESSION: No active cardiopulmonary disease   03/23/2022 Dr. Valeta Harms CT Chest without Contrast CLINICAL DATA:  Lymphadenopathy. Uptake on PET scan favoring a thymoma versus lymphoma. Mildly enlarged right hilar lymph nodes.  FINDINGS: Cardiovascular: Heart is normal in size and configuration.  Three-vessel coronary artery calcifications. No pericardial effusion. Great vessels are normal in caliber. Mild aortic atherosclerotic calcifications.   Mediastinum/Nodes: Anterior mediastinal mass measuring 4.0 x 2.7 cm transversely, 3.0 cm superior to inferior. Mass measured 4.2 x 2.7 cm transversely on the prior PET-CT 3.4 x 2.0  cm transversely by 2.4 cm superior to inferior on the CT dated 10/06/2015.   No enlarged mediastinal or hilar lymph nodes. Small calcified right paratracheal, AP window and hilar lymph nodes.   Normal thyroid.  Trachea and esophagus are unremarkable.   Lungs/Pleura: Mild upper lobe/apical paraseptal emphysema. Few minor areas of linear opacity, anteromedial right middle lobe and left upper lobe lingula consistent with atelectasis or scarring. No evidence of pneumonia or pulmonary edema. No lung mass or nodule. No pleural effusion or pneumothorax.   Upper Abdomen: Aortic atherosclerosis. Visualized upper abdominal structures otherwise unremarkable.   Musculoskeletal: No fracture or acute finding. No bone lesion. No chest wall mass.   IMPRESSION: 1. Anterior mediastinal mass, without change when compared to the prior PET-CT, but larger than it was from the exam dated 10/06/2015.  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 2. No enlarged mediastinal or hilar lymph nodes. There are calcified subcentimeter nodes that are consistent with healed granulomatous disease. 3. No acute findings. 4. Mild upper lobe paraseptal emphysema. 5. Aortic atherosclerosis and 3 vessel coronary artery calcifications.  Aortic Atherosclerosis (ICD10-I70.0) and Emphysema (ICD10-J43.9).   08/08/2021 Dr. Sunday Corn foot MR Chest with or without Contrast CLINICAL DATA:  67 year old male with history of anterior mediastinal mass. Follow-up study.   FINDINGS: Again noted is a smoothly marginated lesion in the anterior mediastinum which measures 4.1 x 2.6 x 3.0 cm (axial image 25 of series 7 and coronal image 21 of series 3). This lesion is nearly isointense to muscle on T1 weighted images, generally mildly hyperintense to muscle on T2 weighted images, although there also some more focal internal T2 hyperintense regions which do not enhance, indicative of small cystic areas. The entire lesion demonstrates some low-level enhancement on  post gadolinium images. No definite internal macroscopic lipid identified. No definite diffusion restriction. This lesion is well separate from  the thyroid gland, and does not have a morphology suggestive of an enlarged lymph node.   IMPRESSION: 1. Smoothly marginated 4.1 x 2.6 x 3.0 cm anterior mediastinal mass which has indeterminate imaging characteristics, but not suggestive of a lesion of either nodal or thyroid origin. However, this lesion has been relatively stable in size dating back to prior study from 10/06/2015, strongly suggestive of a benign etiology, making a germ-cell tumor unlikely. 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  Another differential consideration would include a thymic carcinoid tumor.  07/12/2021 Dr. Valeta Harms NM PET Image Initial (PI) Skull Base To Thigh CLINICAL DATA:  Initial treatment strategy for anterior mediastinal mass.  FINDINGS: Mediastinal blood pool activity: SUV max 2.6   Liver activity: SUV max 3.4   NECK: Symmetric palatine tonsillar activity with maximum SUV 5.3 on the left and 5.2 of the right, probably physiologic.   Incidental CT findings: Remote left cerebellar infarct. Bilateral common carotid atherosclerotic calcifications.   CHEST: 4.2 by 2.7 cm noncalcified anterior mediastinal mass, noncontrast density 56 Hounsfield units, maximum SUV 4.4. On the prior chest CT from 10/06/2015 this measured 3.9 by 2.0 cm.   Right hilar lymph node measuring approximately 1.1 cm on image 73 series 4 with some faint calcifications adjacent to its margins, maximum SUV 4.3. There is also some faintly accentuated right infrahilar activity with fine calcifications in both infrahilar regions.   Incidental CT findings: Paraseptal emphysema. Mild scarring or atelectasis in the lingula. Coronary, aortic arch, and branch vessel atherosclerotic  vascular disease.   ABDOMEN/PELVIS: Spleen normal in size and activity. Faintly accentuated activity in the right posterolateral peripheral zone of the prostate gland near the base, maximum SUV 3.9.   Incidental CT findings: Atherosclerosis is present, including aortoiliac atherosclerotic disease. Infrarenal abdominal aortic aneurysm 3.4 cm in diameter on image 137 series 4.   SKELETON: No significant abnormal hypermetabolic activity in this region.   Incidental CT findings: none   IMPRESSION: 1. Anterior mediastinal mass currently measuring 4.2 by 2.7 cm has a maximum SUV of 4.4, mildly abnormally hypermetabolic. This corresponds to [place] 4 activity. This lesion has mildly increased in size since 2017. 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 Germ-cell tumor is a less likely differential diagnostic consideration. Thymic carcinoma is substantially less likely given the slow growth and the SUV less than 5.0. 2. Mildly enlarged right hilar lymph node has mildly accentuated metabolic activity, and there is also some faintly accentuated right infrahilar activity. Fine calcifications along the right hilum and along other nodal structures favoring at least a component of old granulomatous disease. 3. Mildly accentuated activity in the right posterolateral peripheral zone of the prostate gland near the base. The appearance is nonspecific and could be neoplastic or inflammatory/postinflammatory. Correlate with PSA levels in determining whether further workup warranted. 4. 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  Recommend follow-up every 3 years. Reference: J Am Coll Radiol 5284;13:244-010. 5. Other imaging findings of potential clinical significance: Aortic Atherosclerosis (ICD10-I70.0) and Emphysema (ICD10-J43.9). Coronary atherosclerosis and peripheral atherosclerosis. Remote left cerebellar infarct.  04/19/2021 Anibal Henderson, NP CT Angio Head Neck with or without CM (CODE  STROKE) CLINICAL DATA:  Neuro deficit, acute, stroke suspected  IMPRESSION: CTA head:  1. 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 2. Bilateral PCAs are small and difficult to follow distally, but appear to be patent proximally  with multifocal irregularity/stenosis that is likely severe distally on the left. 3. 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 4. Severe stenosis of the distal right A2 and A3 ACA.   CTA Neck:  1. Severe stenosis of the right vertebral artery origin with poor opacification of the V2 vertebral artery and non opacification of the V3 vertebral artery. Suspected severe stenosis at the dural margin. 2. Severe stenosis of the proximal left vertebral artery. 3. Moderate stenosis of the left common carotid artery origin and proximal right subclavian artery. 4. Bilateral carotid bifurcation atherosclerosis with approximately 40% stenosis of the left proximal ICA. 5. Partially imaged mass in the anterior mediastinum which measures 4.3 x 2.7 cm, increased in size since 2017 CTA chest. This could potentially represent a slowly enlarging malignancy or abnormal lymph node. Recommend CT chest with contrast or PET-CT for further evaluation. 6. Aortic Atherosclerosis (ICD10-I70.0) and Emphysema (ICD10-J43.9).   Tobacco/Marijuana/Snuff/ETOH use: Tobacco and alcohol use. No drug or smokeless tobacco use.  Past/Anticipated interventions by cardiothoracic surgery, if any:   Dr. Kipp Brood 06/17/2022 Path: A. 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   Assessment / Plan:   67 year old male status postresection of type B2 thymoma.  Referred him to radiation oncology.  He will follow-up in 1 month with a chest x-ray.  Past/Anticipated interventions by medical oncology, if any:  NA  Signs/Symptoms Weight changes, if any:   Yes, 10 lbs due to poor appetite Respiratory complaints, if any:  No Hemoptysis, if any: No Pain issues, if any:  0/10  Soreness at incision site.  SAFETY ISSUES: Prior radiation?  No Pacemaker/ICD?  Cardiac event monitor Possible current pregnancy? Male Is the patient on methotrexate? No  Current Complaints / other details: Need more information on treatment options.

## 2022-06-28 ENCOUNTER — Ambulatory Visit
Admission: RE | Admit: 2022-06-28 | Discharge: 2022-06-28 | Disposition: A | Discharge status: Home / Self Care | Payer: Medicare HMO | Source: Ambulatory Visit | Attending: Radiation Oncology | Admitting: Radiation Oncology

## 2022-06-28 VITALS — BP 139/59 | HR 54 | Temp 97.6°F | Resp 20 | Ht 70.0 in | Wt 162.6 lb

## 2022-06-28 DIAGNOSIS — I10 Essential (primary) hypertension: Secondary | ICD-10-CM | POA: Insufficient documentation

## 2022-06-28 DIAGNOSIS — Z79899 Other long term (current) drug therapy: Secondary | ICD-10-CM | POA: Insufficient documentation

## 2022-06-28 DIAGNOSIS — C37 Malignant neoplasm of thymus: Secondary | ICD-10-CM | POA: Insufficient documentation

## 2022-06-28 DIAGNOSIS — J9859 Other diseases of mediastinum, not elsewhere classified: Secondary | ICD-10-CM

## 2022-06-28 DIAGNOSIS — Z8673 Personal history of transient ischemic attack (TIA), and cerebral infarction without residual deficits: Secondary | ICD-10-CM | POA: Insufficient documentation

## 2022-06-28 DIAGNOSIS — F17210 Nicotine dependence, cigarettes, uncomplicated: Secondary | ICD-10-CM | POA: Diagnosis not present

## 2022-06-28 NOTE — Progress Notes (Signed)
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 Mass of mediastinum  J98.59     2. Type B2 thymoma ([place])  C37       HISTORY OF PRESENT ILLNESS: Caleb Cardenas is a 67 y.o. male seen at the request of Dr. Kipp Brood. He was initially seen in the ED on 04/19/21 with stroke-like symptoms. While undergoing angio head/neck CT, he was found to have a partially-imaged mass in the anterior mediastinum. The mass was previously seen in 2017 and grew in size to 4.3 cm. He was referred to Dr. Valeta Harms and proceeded with recommended PET scan on 07/12/21. The PET scan showed: 4.2 cm anterior mediastinal mass with mildly abnormal hypermetabolism, mildly enlarged right hilar lymph node with mildly accentuated metabolic activity and faintly accentuated right infrahilar activity. He also met with Dr. Kipp Brood to discuss his surgical options. The recommendation was to proceed with bronchoscopy and biopsy, but he failed to follow up. He later returned to Dr. Valeta Harms in 02/2022. A repeat chest CT on 03/23/22 showed no change in the anterior mediastinal mass as compared to the prior PET imaging so he met back with Dr. Kipp Brood in 04/2022 and they agreed to forego biopsy and move forward with resection of the mass which was performed on 06/09/22. Final surgical pathology confirmed a 4.2 cm thymoma, type B2, pT1a, with negative margins. He has been kindly referred to [place] today to discuss the potential role for radiotherapy in the management of his disease.  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   Vertebral basilar insufficiency    Vertigo       PAST SURGICAL HISTORY: Past Surgical History:  Procedure Laterality Date   KNEE SURGERY     x2  in high school   TONSILLECTOMY     as a child    FAMILY HISTORY:  Family History  Problem Relation Age of Onset   Heart attack Mother    Alzheimer's disease Father    Pancreatic cancer Brother     SOCIAL HISTORY:  Social History   Socioeconomic History   Marital status: Divorced    Spouse name: Not on file   Number of children: 1   Years of education: Not on file   Highest education level: Some college, no degree  Occupational History    Comment: house flipping, painting  Tobacco Use   Smoking status: Every Day    Packs/day: 1.00    Types: Cigarettes    Passive exposure: Never   Smokeless tobacco: Never   Tobacco comments:    Smoking 15-20 cigs a day. 03/14/2022 [place]  Vaping Use   Vaping Use: Never used  Substance and Sexual Activity   Alcohol use: Yes    Comment: occ   Drug use: No   Sexual activity: Yes  Other Topics Concern   Not on file  Social History Narrative   Lives alone   Caffeine-  sodas 2-3 a day   Social Determinants of Radio broadcast assistant Strain: Not on file  Food Insecurity: Not on file  Transportation Needs: Not on file  Physical Activity: Not on file  Stress: Not on file  Social Connections: Not on file  Intimate Partner Violence: Not on file    ALLERGIES: Patient has no known allergies.  MEDICATIONS:  Current Outpatient Medications  Medication Sig Dispense Refill   amLODipine (NORVASC) 10 MG tablet Take 1 tablet (10 mg total) by mouth daily. 30 tablet 1   atorvastatin (LIPITOR) 80 MG tablet TAKE 1 TABLET BY MOUTH EVERYDAY AT BEDTIME 60 tablet 0   clopidogrel (PLAVIX) 75 MG tablet Take 1 tablet (75 mg total) by mouth daily. 90 tablet 4   metoprolol succinate  (TOPROL-XL) 25 MG 24 hr tablet TAKE 1 TABLET (25 MG TOTAL) BY MOUTH DAILY. 90 tablet 2   Multiple Vitamin (MULTIVITAMIN ADULT) TABS Take 1 tablet by mouth 3 (three) times a week.     Omega 3 1000 MG CAPS Take 1,000 mg by mouth once a week.     No current facility-administered medications for this encounter.    REVIEW OF SYSTEMS:  On review of systems, the patient reports that he is doing well overall. He denies any chest pain, shortness of breath, cough, fevers, chills, night sweats. He notes a 10 lbs weight loss over the past 6 months-1 year, due to poor appetite. He denies any bowel or bladder disturbances, and denies abdominal pain, nausea or vomiting. He denies any new musculoskeletal or joint aches or pains, only some soreness at the site of his surgery. A complete review of systems is obtained and is otherwise negative.    PHYSICAL EXAM:  Wt Readings from Last 3 Encounters:  06/28/22 162 lb 9.6 oz (73.8 kg)  06/17/22 165 lb (74.8 kg)  06/09/22 163 lb 2.3 oz (74 kg)   Temp Readings from Last 3 Encounters:  06/28/22 97.6 F (36.4 C)  06/11/22 (!) 97.4 F (36.3 C) (Oral)  06/07/22 98.6 F (37 C) (Oral)   BP Readings from Last 3 Encounters:  06/28/22 (!) 139/59  06/17/22 130/65  06/11/22 (!) 175/80   Pulse Readings from Last 3 Encounters:  06/28/22 (!) 54  06/17/22 65  06/11/22 (!) 57   Pain Assessment Pain Score: 0-No pain/10  In general this is a well appearing Caucasian male in no acute distress. He's alert and oriented x4 and appropriate throughout the examination. Cardiopulmonary assessment is negative for acute distress and he exhibits normal effort.    KPS = 100  100 - Normal; no complaints; no evidence of disease. 90   - Able to carry on normal activity; minor signs or symptoms of disease. 80   - Normal activity with effort; some signs or symptoms of disease. 77   - Cares for self; unable to carry on normal activity or to do active work. 60   - Requires  occasional assistance, but is able to care for most of his personal needs. 50   - Requires considerable assistance and frequent medical care. 62   - Disabled; requires special care and assistance. 20   - Severely disabled; hospital admission is indicated although death not imminent. 12   - Very sick; hospital admission necessary; active supportive treatment necessary. 10   - Moribund; fatal processes progressing rapidly. 0     - Dead  Karnofsky DA, Abelmann WH, Craver LS and Burchenal [facility] [phone]) The use of the nitrogen  mustards in the palliative treatment of carcinoma: with particular reference to bronchogenic carcinoma Cancer 1 634-56  LABORATORY DATA:  Lab Results  Component Value Date   WBC 9.0 06/11/2022   HGB 11.6 (L) 06/11/2022   HCT 33.9 (L) 06/11/2022   MCV 89.2 06/11/2022   PLT 144 (L) 06/11/2022   Lab Results  Component Value Date   NA 137 06/11/2022   K 3.5 06/11/2022   CL 109 06/11/2022   CO2 21 (L) 06/11/2022   Lab Results  Component Value Date   ALT 8 06/11/2022   AST 28 06/11/2022   ALKPHOS 73 06/11/2022   BILITOT 0.7 06/11/2022     RADIOGRAPHY: CUP PACEART REMOTE DEVICE CHECK  Result Date: 06/15/2022 ILR summary report received. Battery status OK. Normal device function. No new symptom, tachy, brady, or pause episodes. No new AF episodes. AF burden is 0% of the time.  Monthly summary reports and ROV/PRN Kathy Breach, RN, CCDS, [employer]  DG Chest 2 View  Result Date: 06/11/2022 CLINICAL DATA:  [id number] Pneumothorax [id number] EXAM: CHEST - 2 VIEW COMPARISON:  06/10/2022 FINDINGS: Left-sided chest tube is removed. There is linear subsegmental atelectasis identified medial left base. No definite recurrent pneumothorax identified. There is mild pulmonary vascular congestion without focal consolidation. Aorta calcified. Implanted cardiac monitor noted. IMPRESSION: Left base subsegmental atelectasis. Pulmonary vascular congestion. No pneumothorax identified  after chest tube removal. Electronically Signed   By: Sammie Bench M.D.   On: 06/11/2022 11:00   DG CHEST PORT 1 VIEW  Result Date: 06/10/2022 CLINICAL DATA:  Pneumothorax. EXAM: PORTABLE CHEST 1 VIEW COMPARISON:  Same day. FINDINGS: No definite pneumothorax is noted status post left-sided chest tube removal. IMPRESSION: No definite pneumothorax status post left-sided chest tube removal. Electronically Signed   By: Marijo Conception M.D.   On: 06/10/2022 14:07   DG Chest Port 1 View  Result Date: 06/10/2022 CLINICAL DATA:  Pneumothorax. EXAM: PORTABLE CHEST 1 VIEW COMPARISON:  June 09, 2022. FINDINGS: Stable cardiomegaly. Minimal left midlung subsegmental atelectasis or scarring is noted. Right lung is clear. Stable left-sided chest tube is noted. No definite pneumothorax is noted. Bony thorax is unremarkable. IMPRESSION: Minimal left midlung subsegmental atelectasis or scarring. Stable left-sided chest tube without definite pneumothorax. Electronically Signed   By: Marijo Conception M.D.   On: 06/10/2022 14:04   DG Chest Port 1 View  Result Date: 06/09/2022 CLINICAL DATA:  Pneumothorax EXAM: PORTABLE CHEST 1 VIEW COMPARISON:  CXR 06/07/22 FINDINGS: Postsurgical changes from interval reported cardiothoracic surgery. Left-sided wireless cardiac device in place. Interval placement of a left-sided thoracostomy tube. No pleural effusion. Possible small left apical pneumothorax. Low lung volumes. Cardiac and mediastinal contours are likely unchanged when accounting for differences in lung volumes. No displaced rib fractures. Visualized upper abdomen is unremarkable. IMPRESSION: Interval placement of a left-sided thoracostomy tube. Possible small left apical pneumothorax. Electronically Signed   By: Marin Roberts M.D.   On: 06/09/2022 10:58   DG Chest 2 View  Result Date: 06/09/2022 CLINICAL DATA:  [id number] Pre-op exam [id number] EXAM: CHEST - 2 VIEW.  Patient is rotated. COMPARISON:  Chest x-ray  10/06/2015 FINDINGS: Wireless cardiac device overlies left chest. The heart and mediastinal contours are within normal limits. Prominent right hilar vasculature due to patient rotation No focal consolidation. No pulmonary edema. No pleural effusion. No pneumothorax. No acute osseous abnormality. IMPRESSION: No active cardiopulmonary disease. Electronically Signed   By: Iven Finn M.D.   On: 06/09/2022 03:47  IMPRESSION/PLAN: 1. 67 y.o. 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 We discussed the natural history of thymoma and general treatment, highlighting the role of radiotherapy in the management. We reviewed the NCCN guidelines for pT1a Thymoma which recommends post-operative observation only, with CT Chest imaging every 6 months for the first 2 years and then annually until 10 years disease free.  Should he have any disease recurrence noted on follow up imaging, we discussed the recommended treatment at that time would be for a 5.5 week course of daily radiotherapy. We briefly reviewed the acute and late side effects that could be expected with this treatment and he was encouraged to ask questions that were answered to his stated satisfaction.  At the end of our conversation, the patient is comfortable and in agreement with the recommendation for observation, stating that he will be diligent in follow up. We will share our discussion with Dr. Kipp Brood and would be more than happy to continue to follow the patient with serial CT Chest scans as per NCCN guidelines or, if Dr. Kipp Brood prefers to continue to follow the patient himself, we are happy to follow in correspondence and see him back as needed should there be any clinical indication for treatment in the future. We enjoyed meeting him today and look forward to continuing to participate in his care.  We personally spent 60 minutes in  this encounter including chart review, reviewing radiological studies, meeting face-to-face with the patient, entering orders and completing documentation.    Nicholos Johns, PA-C    Tyler Pita, MD  [facility] Direct Dial: [phone]  Fax: [phone] [email or web address]  Skype  [email or web address]   This document serves as a record of services personally performed by Tyler Pita, MD and Freeman Caldron, PA-C. It was created on their behalf by Wilburn Mylar, a trained medical scribe. The creation of this record is based on the scribe's personal observations and the provider's statements to them. This document has been checked and approved by the attending provider.

## 2022-07-03 ENCOUNTER — Other Ambulatory Visit: Payer: Self-pay | Admitting: Physician Assistant

## 2022-07-05 NOTE — Progress Notes (Signed)
[facility] Summary Report / Loop Recorder

## 2022-07-12 NOTE — Progress Notes (Signed)
[address]       [place] [address]             [phone]        Almalik L Riling [place] Medical Record #[id number] Date of Birth: 1955/10/26  Referring: [facility] Primary [facility]: [facility] [place] Primary Cardiologist:None  Reason for visit:   follow-up  History of Present Illness:     67 year old male presents in follow-up.  Overall he is doing well.  He does have some tightness along the access incision.  Physical Exam: BP 136/73 (BP Location: Right Arm, Patient Position: Sitting)   Pulse (!) 58   Resp 20   Ht '5\' 10"'$  (1.778 m)   Wt 163 lb (73.9 kg)   SpO2 97% Comment: RA  BMI 23.39 kg/m   Alert NAD Incision clean.   Abdomen, ND No peripheral edema   Diagnostic Studies & Laboratory data: CXR: Small left effusion     Assessment / Plan:   67 year old male status postresection of type B2 thymoma. Referred him to radiation oncology. Surveillance for now.  He will follow-up as needed.   Lajuana Matte 07/15/2022 1:17 PM

## 2022-07-13 ENCOUNTER — Other Ambulatory Visit: Payer: Self-pay | Admitting: Urology

## 2022-07-13 DIAGNOSIS — C37 Malignant neoplasm of thymus: Secondary | ICD-10-CM

## 2022-07-14 ENCOUNTER — Other Ambulatory Visit: Payer: Self-pay | Admitting: Thoracic Surgery (Cardiothoracic Vascular Surgery)

## 2022-07-14 DIAGNOSIS — D4989 Neoplasm of unspecified behavior of other specified sites: Secondary | ICD-10-CM

## 2022-07-15 ENCOUNTER — Ambulatory Visit
Admission: RE | Admit: 2022-07-15 | Discharge: 2022-07-15 | Disposition: A | Discharge status: Home / Self Care | Payer: Medicare HMO | Source: Ambulatory Visit | Attending: Thoracic Surgery (Cardiothoracic Vascular Surgery) | Admitting: Thoracic Surgery (Cardiothoracic Vascular Surgery)

## 2022-07-15 ENCOUNTER — Ambulatory Visit (INDEPENDENT_AMBULATORY_CARE_PROVIDER_SITE_OTHER): Payer: Self-pay | Admitting: Thoracic Surgery (Cardiothoracic Vascular Surgery)

## 2022-07-15 VITALS — BP 136/73 | HR 58 | Resp 20 | Ht 70.0 in | Wt 163.0 lb

## 2022-07-15 DIAGNOSIS — D4989 Neoplasm of unspecified behavior of other specified sites: Secondary | ICD-10-CM

## 2022-07-15 DIAGNOSIS — Z09 Encounter for follow-up examination after completed treatment for conditions other than malignant neoplasm: Secondary | ICD-10-CM

## 2022-07-15 DIAGNOSIS — J9859 Other diseases of mediastinum, not elsewhere classified: Secondary | ICD-10-CM

## 2022-07-18 ENCOUNTER — Ambulatory Visit: Payer: Medicare HMO

## 2022-07-18 DIAGNOSIS — I639 Cerebral infarction, unspecified: Secondary | ICD-10-CM | POA: Diagnosis not present

## 2022-07-19 LAB — CUP PACEART REMOTE DEVICE CHECK
Date Time Interrogation Session: 20240128230625
Implantable Pulse Generator Implant Date: 20230911

## 2022-07-26 ENCOUNTER — Other Ambulatory Visit: Payer: Self-pay | Admitting: Diagnostic Neuroimaging

## 2022-08-19 ENCOUNTER — Other Ambulatory Visit: Payer: Self-pay | Admitting: Diagnostic Neuroimaging

## 2022-08-22 ENCOUNTER — Ambulatory Visit (INDEPENDENT_AMBULATORY_CARE_PROVIDER_SITE_OTHER): Payer: Medicare HMO

## 2022-08-22 DIAGNOSIS — I639 Cerebral infarction, unspecified: Secondary | ICD-10-CM | POA: Diagnosis not present

## 2022-08-23 ENCOUNTER — Other Ambulatory Visit: Payer: Self-pay | Admitting: Diagnostic Neuroimaging

## 2022-08-23 LAB — CUP PACEART REMOTE DEVICE CHECK
Date Time Interrogation Session: 20240301230427
Implantable Pulse Generator Implant Date: 20230911

## 2022-09-01 NOTE — Progress Notes (Signed)
Carelink Summary Report / Loop Recorder 

## 2022-09-04 ENCOUNTER — Telehealth: Payer: Self-pay | Admitting: Diagnostic Neuroimaging

## 2022-09-19 NOTE — Progress Notes (Unsigned)
[facility]  PATIENT: Caleb Cardenas DOB: 1955-07-01  REFERRING CLINICIAN: [facility] HISTORY FROM: patient  REASON FOR VISIT: follow up   HISTORICAL  CHIEF COMPLAINT:  No chief complaint on file.   HISTORY OF PRESENT ILLNESS:   Update 09/20/2022 JM: Returns for stroke follow-up visit after prior visit over 1 year ago.  Stable from stroke standpoint without new or reoccurring stroke/TIA symptoms.  Remains on Plavix and atorvastatin, requesting refills today.      UPDATE (05/25/21, VRP): Since last visit, went to ER later on 04/19/21; now on aspirin and plavix and off xarelto. No recurrent symptoms. Still smoking, but cutting down. Tolerating meds.   UPDATE (04/19/21, VRP): Since last visit, doing poorly. More dizziness, double vision, lightheaded, euphoria, fog spells (1-2 per week; few minutes; intermittent).     PRIOR HPI: (04/21/20; VRP): 67 year old male with cigarette smoking, hypertension, here for evaluation of stroke.  Patient has had intermittent episodes of dizziness since September 2021.  He went to urgent care several times for these symptoms.  Symptoms suddenly worsened on 03/31/2020 when he became very dizzy and had blurred vision.  Patient went to the hospital on 04/01/2020 for evaluation.  MRI of the brain showed acute stroke in the left cerebellum, slightly in the right cerebellum and midline cerebellum.  Significant intracranial atherosclerosis also noted the bilateral vertebral arteries.  Patient was discharged home with follow-up in outpatient neurology.  Since that time symptoms have improved.  He has continued on aspirin and Plavix.  He still smoking half pack cigarettes per day but try to cut down.  He drinks 3 Mountain Dew's per day.  He has not followed up with PCP yet.   REVIEW OF SYSTEMS: Full 14 system review of systems performed and negative with exception of: As per HPI.  ALLERGIES: No Known Allergies  HOME  MEDICATIONS: Outpatient Medications Prior to Visit  Medication Sig Dispense Refill   amLODipine (NORVASC) 10 MG tablet TAKE 1 TABLET BY MOUTH EVERY DAY 90 tablet 1   atorvastatin (LIPITOR) 80 MG tablet TAKE 1 TABLET BY MOUTH EVERYDAY AT BEDTIME 15 tablet 0   clopidogrel (PLAVIX) 75 MG tablet TAKE 1 TABLET BY MOUTH EVERY DAY 15 tablet 0   metoprolol succinate (TOPROL-XL) 25 MG 24 hr tablet TAKE 1 TABLET (25 MG TOTAL) BY MOUTH DAILY. 90 tablet 2   Multiple Vitamin (MULTIVITAMIN ADULT) TABS Take 1 tablet by mouth 3 (three) times a week.     Omega 3 1000 MG CAPS Take 1,000 mg by mouth once a week.     No facility-administered medications prior to visit.    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   Vertebral basilar insufficiency    Vertigo     PAST SURGICAL HISTORY: Past Surgical History:  Procedure Laterality Date   KNEE SURGERY     x2  in high school   TONSILLECTOMY     as a child    FAMILY HISTORY: Family History  Problem Relation Age of Onset   Heart attack Mother    Alzheimer's disease Father    Pancreatic cancer Brother     SOCIAL HISTORY: Social History   Socioeconomic History   Marital status: Divorced    Spouse name: Not on file   Number of children: 1  Years of education: Not on file   Highest education level: Some college, no degree  Occupational History    Comment: house flipping, painting  Tobacco Use   Smoking status: Every Day    Packs/day: 1    Types: Cigarettes    Passive exposure: Never   Smokeless tobacco: Never   Tobacco comments:    Smoking 15-20 cigs a day. 03/14/2022 Tay  Vaping Use   Vaping Use: Never used  Substance and Sexual Activity   Alcohol use: Yes    Comment: occ   Drug use: No   Sexual activity: Yes  Other Topics Concern   Not on file  Social History Narrative    Lives alone   Caffeine- sodas 2-3 a day   Social Determinants of Health   Financial Resource Strain: Not on file  Food Insecurity: Not on file  Transportation Needs: Not on file  Physical Activity: Not on file  Stress: Not on file  Social Connections: Not on file  Intimate Partner Violence: Not on file     PHYSICAL EXAM  GENERAL EXAM/CONSTITUTIONAL: Vitals:  There were no vitals filed for this visit.  There is no height or weight on file to calculate BMI. Wt Readings from Last 3 Encounters:  07/15/22 163 lb (73.9 kg)  06/28/22 162 lb 9.6 oz (73.8 kg)  06/17/22 165 lb (74.8 kg)   Patient is in no distress; well developed, nourished and groomed; neck is supple  CARDIOVASCULAR: Examination of carotid arteries is normal; no carotid bruits Regular rate and rhythm, no murmurs Examination of peripheral vascular system by observation and palpation is normal  EYES: Ophthalmoscopic exam of optic discs and posterior segments is normal; no papilledema or hemorrhages No results found.  MUSCULOSKELETAL: Gait, strength, tone, movements noted in Neurologic exam below  NEUROLOGIC: MENTAL STATUS:      No data to display         awake, alert, oriented to person, place and time recent and remote memory intact normal attention and concentration language fluent, comprehension intact, naming intact fund of knowledge appropriate  CRANIAL NERVE:  2nd - no papilledema on fundoscopic exam 2nd, 3rd, 4th, 6th - pupils equal and reactive to light, visual fields full to confrontation, extraocular muscles intact, no nystagmus 5th - facial sensation symmetric 7th - facial strength symmetric 8th - hearing intact 9th - palate elevates symmetrically, uvula midline 11th - shoulder shrug symmetric 12th - tongue protrusion midline  MOTOR:  normal bulk and tone, full strength in the BUE, BLE  SENSORY:  normal and symmetric to light touch, temperature, vibration  COORDINATION:   finger-nose-finger, fine finger movements normal; EXCEPT SLIGHTLY SLOWER ON LEFT SIDE   REFLEXES:  deep tendon reflexes present and symmetric  GAIT/STATION:  narrow based gait     DIAGNOSTIC DATA (LABS, IMAGING, TESTING) - I reviewed patient records, labs, notes, testing and imaging myself where available.  Lab Results  Component Value Date   WBC 9.0 06/11/2022   HGB 11.6 (L) 06/11/2022   HCT 33.9 (L) 06/11/2022   MCV 89.2 06/11/2022   PLT 144 (L) 06/11/2022      Component Value Date/Time   NA 137 06/11/2022 0125   K 3.5 06/11/2022 0125   CL 109 06/11/2022 0125   CO2 21 (L) 06/11/2022 0125   GLUCOSE 91 06/11/2022 0125   BUN 47 (H) 06/11/2022 0125   CREATININE 2.11 (H) 06/11/2022 0125   CALCIUM 8.0 (L) 06/11/2022 0125   PROT 5.9 (L) 06/11/2022 0125  ALBUMIN 2.9 (L) 06/11/2022 0125   AST 28 06/11/2022 0125   ALT 8 06/11/2022 0125   ALKPHOS 73 06/11/2022 0125   BILITOT 0.7 06/11/2022 0125   GFRNONAA 34 (L) 06/11/2022 0125   GFRAA 58 (L) 10/06/2015 1408   Lab Results  Component Value Date   CHOL 199 04/21/2020   HDL 25 (L) 04/21/2020   LDLCALC 76 04/21/2020   TRIG 621 (HH) 04/21/2020   CHOLHDL 8.0 (H) 04/21/2020   Lab Results  Component Value Date   HGBA1C 6.0 (H) 04/21/2020   No results found for: "VITAMINB12" No results found for: "TSH"   04/01/20 MRI brain, MRA head / neck [I reviewed images myself and agree with interpretation. -VRP]  1. Bilateral posterior circulation acute ischemia with a relatively large left PICA territory infarct. No hemorrhage or mass effect. 2. Suspect occlusion or stenosis of the distal left P3 segment.  3. 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 4. Short segment moderate stenosis of the proximal left external carotid artery.  04/13/21 MRI brain 1. No acute intracranial abnormality. 2. Remote left PICA territory infarct, with a few additional tiny right cerebellar infarcts. 3. Underlying moderate  chronic microvascular ischemic disease.   04/19/21 CTA head / neck CTA head: 1. 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 2. Bilateral PCAs are small and difficult to follow distally, but appear to be patent proximally with multifocal irregularity/stenosis that is likely severe distally on the left. 3. 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 4. Severe stenosis of the distal right A2 and A3 ACA.   CTA Neck: 1. Severe stenosis of the right vertebral artery origin with poor opacification of the V2 vertebral artery and non opacification of the V3 vertebral artery. Suspected severe stenosis at the dural margin. 2. Severe stenosis of the proximal left vertebral artery. 3. Moderate stenosis of the left common carotid artery origin and proximal right subclavian artery. 4. Bilateral carotid bifurcation atherosclerosis with approximately 40% stenosis of the left proximal ICA. 5. Partially imaged mass in the anterior mediastinum which measures 4.3 x 2.7 cm, increased in size since 2017 CTA chest. This could potentially represent a slowly enlarging malignancy or abnormal lymph node. Recommend CT chest with contrast or PET-CT for further evaluation. 6. Aortic Atherosclerosis (ICD10-I70.0) and Emphysema (ICD10-J43.9).  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 The distal right V4 segment is reconstituted from the basilar, feeding the right PICA. *At least 75% stenosis at the left V4 segment. 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 - Left common carotid origin stenosis measuring 65%. Left ICA origin stenosis measuring 60%. - Technically limited intracranial CTA. No detected change from study approximally 1 month ago.     ASSESSMENT AND PLAN  67 y.o. year old male here with:    Dx:  No diagnosis found.     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; embolic) - continue plavix 75mg  daily and atorvastatin - refills provided but advised to obtain future refills by PCP as these will be life long medications - continue BP control (SBP goal 120-140 due to severe posterior circulation atherosclerosis) - if recurrent symptoms then may need to consider endovascular stenting / revascularization - daily physical activity / exercise (at least 15-30 minutes) - eat more plants / vegetables - increase social activities, brain  stimulation, games, puzzles, hobbies, crafts, arts, music - aim for at least 7-8 hours sleep per night (or more) - avoid smoking and alcohol  MEDIASTINAL MASS - follow up with PCP; refer to pulmonology  DENTAL PROCEDURE CLEARANCE - recommend to wait until August 18, 2021, if able to wait  No orders of the defined types were placed in this encounter.  No orders of the defined types were placed in this encounter.  No follow-ups on file.    I spent *** minutes of face-to-face and non-face-to-face time with patient.  This included previsit chart review, lab review, study review, order entry, electronic health record documentation, patient education and discussion regarding above diagnoses and treatment plan and answered all other questions to patient's satisfaction  Frann Rider, [facility]  [facility] [address] [place], [place] [address]  Phone [phone] Fax [phone] Note: This document was prepared with digital dictation and possible smart phrase technology. Any transcriptional errors that result from this process are unintentional.

## 2022-09-20 ENCOUNTER — Ambulatory Visit: Payer: Medicare HMO | Admitting: Adult Health

## 2022-09-20 ENCOUNTER — Encounter: Payer: Self-pay | Admitting: Adult Health

## 2022-09-20 VITALS — BP 122/62 | HR 51 | Ht 70.0 in | Wt 168.0 lb

## 2022-09-20 DIAGNOSIS — I63112 Cerebral infarction due to embolism of left vertebral artery: Secondary | ICD-10-CM | POA: Diagnosis not present

## 2022-09-20 DIAGNOSIS — I6523 Occlusion and stenosis of bilateral carotid arteries: Secondary | ICD-10-CM | POA: Diagnosis not present

## 2022-09-20 MED ORDER — CLOPIDOGREL BISULFATE 75 MG PO TABS: 75.0000 mg | ORAL_TABLET | Freq: Every day | ORAL | 3 refills | Status: AC

## 2022-09-20 MED ORDER — ATORVASTATIN CALCIUM 80 MG PO TABS: 80.0000 mg | ORAL_TABLET | Freq: Every day | ORAL | 3 refills | Status: DC

## 2022-09-20 NOTE — Patient Instructions (Signed)
Continue clopidogrel 75 mg daily  and atorvastatin  for secondary stroke prevention  You will be called to schedule a carotid ultrasound to monitor carotid stenosis   Please ensure that your cholesterol levels are being monitored routinely by PCP   Continue to follow up with PCP regarding cholesterol and blood pressure management  Maintain strict control of hypertension with blood pressure goal below 130/90 and cholesterol with LDL cholesterol (bad cholesterol) goal below 70 mg/dL.   Signs of a Stroke? Follow the BEFAST method:  Balance Watch for a sudden loss of balance, trouble with coordination or vertigo Eyes Is there a sudden loss of vision in one or both eyes? Or double vision?  Face: Ask the person to smile. Does one side of the face droop or is it numb?  Arms: Ask the person to raise both arms. Does one arm drift downward? Is there weakness or numbness of a leg? Speech: Ask the person to repeat a simple phrase. Does the speech sound slurred/strange? Is the person confused ? Time: If you observe any of these signs, call 911.        Thank you for coming to see [place] at [facility]. I hope we have been able to provide you high quality care today.  You may receive a patient satisfaction survey over the next few weeks. We would appreciate your feedback and comments so that we may continue to improve ourselves and the health of our patients.

## 2022-09-26 ENCOUNTER — Ambulatory Visit (INDEPENDENT_AMBULATORY_CARE_PROVIDER_SITE_OTHER): Payer: Medicare HMO

## 2022-09-26 DIAGNOSIS — I639 Cerebral infarction, unspecified: Secondary | ICD-10-CM

## 2022-09-26 LAB — CUP PACEART REMOTE DEVICE CHECK
Date Time Interrogation Session: 20240407231039
Implantable Pulse Generator Implant Date: 20230911

## 2022-09-30 NOTE — Progress Notes (Signed)
Carelink Summary Report / Loop Recorder 

## 2022-10-31 ENCOUNTER — Ambulatory Visit (INDEPENDENT_AMBULATORY_CARE_PROVIDER_SITE_OTHER): Payer: Medicare HMO

## 2022-10-31 DIAGNOSIS — I639 Cerebral infarction, unspecified: Secondary | ICD-10-CM | POA: Diagnosis not present

## 2022-10-31 LAB — CUP PACEART REMOTE DEVICE CHECK
Date Time Interrogation Session: 20240512230750
Implantable Pulse Generator Implant Date: 20230911

## 2022-11-02 NOTE — Progress Notes (Signed)
Carelink Summary Report / Loop Recorder 

## 2022-11-10 ENCOUNTER — Ambulatory Visit (HOSPITAL_COMMUNITY)
Admission: RE | Admit: 2022-11-10 | Discharge: 2022-11-10 | Disposition: A | Discharge status: Home / Self Care | Payer: Medicare HMO | Source: Ambulatory Visit | Attending: Adult Health | Admitting: Adult Health

## 2022-11-10 DIAGNOSIS — I6523 Occlusion and stenosis of bilateral carotid arteries: Secondary | ICD-10-CM | POA: Insufficient documentation

## 2022-11-21 ENCOUNTER — Telehealth: Payer: Self-pay | Admitting: *Deleted

## 2022-11-21 NOTE — Telephone Encounter (Signed)
Called patient to inform of CT for 12/09/22 - arrival time- 8:15 am @ [facility], no restrictions to test, patient to receive results from Ashlyn Bruning on 12/14/22 @ 10 am via telephone, spoke with patient and he is aware of these appts. and the instructions

## 2022-11-22 ENCOUNTER — Telehealth: Payer: Self-pay | Admitting: *Deleted

## 2022-11-22 NOTE — Telephone Encounter (Signed)
Called patient to inform that CT has been rescheduled for 12-26-22- arrival time- 9:15 am @ [facility], no restrictions to test, patient to receive results from Ashlyn Bruning on 12-28-22 @ 10 am via telephone, spoke with patient and he is aware of these appts. and the instructions

## 2022-11-28 NOTE — Progress Notes (Signed)
Carelink Summary Report / Loop Recorder 

## 2022-12-05 ENCOUNTER — Ambulatory Visit (INDEPENDENT_AMBULATORY_CARE_PROVIDER_SITE_OTHER): Payer: Medicare HMO

## 2022-12-05 DIAGNOSIS — I639 Cerebral infarction, unspecified: Secondary | ICD-10-CM

## 2022-12-05 LAB — CUP PACEART REMOTE DEVICE CHECK
Date Time Interrogation Session: 20240614230103
Implantable Pulse Generator Implant Date: 20230911

## 2022-12-09 ENCOUNTER — Ambulatory Visit (HOSPITAL_COMMUNITY): Payer: Medicare HMO

## 2022-12-14 ENCOUNTER — Ambulatory Visit: Payer: Self-pay | Admitting: Urology

## 2022-12-26 ENCOUNTER — Ambulatory Visit (HOSPITAL_COMMUNITY)
Admission: RE | Admit: 2022-12-26 | Discharge: 2022-12-26 | Disposition: A | Discharge status: Home / Self Care | Payer: Medicare HMO | Source: Ambulatory Visit | Attending: Urology | Admitting: Urology

## 2022-12-26 ENCOUNTER — Other Ambulatory Visit: Payer: Self-pay | Admitting: Adult Health

## 2022-12-26 DIAGNOSIS — C37 Malignant neoplasm of thymus: Secondary | ICD-10-CM

## 2022-12-26 DIAGNOSIS — R748 Abnormal levels of other serum enzymes: Secondary | ICD-10-CM

## 2022-12-26 NOTE — Progress Notes (Signed)
Carelink Summary Report / Loop Recorder 

## 2022-12-27 ENCOUNTER — Telehealth: Payer: Self-pay

## 2022-12-27 NOTE — Telephone Encounter (Signed)
-----   Message from Ihor Austin, NP sent at 12/26/2022  9:23 AM EDT ----- Please advise patient that recent carotid ultrasound showed bilateral ICA stenosis of 1 to 39% although noted left ICA stenosis range may be underestimated due to shadowing.  Vertebral arteries show narrowing with known vertebral artery stenosis.  Recommend completing CTA head and neck for further evaluation but will need updated lab work to check kidney function due to receiving contrast with imaging prior to completing. Order will be placed to complete lab work, once resulted will place order for CTA head/neck. Thank you.

## 2022-12-27 NOTE — Telephone Encounter (Signed)
I called patient to discuss. No answer, left a message asking him to call us back.  

## 2022-12-28 ENCOUNTER — Ambulatory Visit
Admission: RE | Admit: 2022-12-28 | Discharge: 2022-12-28 | Disposition: A | Discharge status: Home / Self Care | Payer: Medicare HMO | Source: Ambulatory Visit | Attending: Urology | Admitting: Urology

## 2022-12-28 ENCOUNTER — Telehealth: Payer: Self-pay | Admitting: Urology

## 2022-12-28 DIAGNOSIS — C37 Malignant neoplasm of thymus: Secondary | ICD-10-CM

## 2022-12-28 NOTE — Telephone Encounter (Signed)
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 This CT chest scan from 12/26/22 does not show any evidence of disease recurrence or progression so I advised that we would proceed as planned with the serial CT Chest scans every 6 months for the first 2 years and then annually until 10 years disease free. I also advised him to feel free to call with any questions or concerns in the interim. I will share this information with his thoracic surgeon, Dr. Cliffton Asters, as well.  Marguarite Arbour, MMS, PA-C [place]  [facility] Oncology Physician Assistant Direct Dial: [phone]  Fax: [phone]

## 2022-12-29 NOTE — Telephone Encounter (Signed)
Returned the call back to the patient and was able to review the results in detail as well as recommendation from [place] NP.  Pt is about to go on vacation and states he will plan to come in upon returning from vacation and get lab work completed. Reviewed the lab hours with him. Pt verbalized understanding.

## 2022-12-29 NOTE — Telephone Encounter (Signed)
Called to review the results with pt as a 2nd attempt. There was no answer. LVM asking for a call back. Will send a mychart message as well.

## 2022-12-29 NOTE — Telephone Encounter (Signed)
Pt returned phone call, would like a call back.  

## 2023-01-05 ENCOUNTER — Ambulatory Visit (INDEPENDENT_AMBULATORY_CARE_PROVIDER_SITE_OTHER): Payer: Medicare HMO

## 2023-01-05 DIAGNOSIS — I639 Cerebral infarction, unspecified: Secondary | ICD-10-CM | POA: Diagnosis not present

## 2023-01-05 LAB — CUP PACEART REMOTE DEVICE CHECK
Date Time Interrogation Session: 20240717230456
Implantable Pulse Generator Implant Date: 20230911

## 2023-01-06 ENCOUNTER — Other Ambulatory Visit (INDEPENDENT_AMBULATORY_CARE_PROVIDER_SITE_OTHER): Payer: Self-pay

## 2023-01-06 DIAGNOSIS — R748 Abnormal levels of other serum enzymes: Secondary | ICD-10-CM

## 2023-01-06 DIAGNOSIS — Z0289 Encounter for other administrative examinations: Secondary | ICD-10-CM

## 2023-01-07 LAB — RENAL FUNCTION PANEL
Albumin: 4.1 g/dL (ref 3.9–4.9)
BUN/Creatinine Ratio: 12 (ref 10–24)
BUN: 21 mg/dL (ref 8–27)
CO2: 21 mmol/L (ref 20–29)
Calcium: 8.5 mg/dL — ABNORMAL LOW (ref 8.6–10.2)
Chloride: 105 mmol/L (ref 96–106)
Creatinine, Ser: 1.74 mg/dL — ABNORMAL HIGH (ref 0.76–1.27)
Glucose: 102 mg/dL — ABNORMAL HIGH (ref 70–99)
Phosphorus: 2.9 mg/dL (ref 2.8–4.1)
Potassium: 3.4 mmol/L — ABNORMAL LOW (ref 3.5–5.2)
Sodium: 140 mmol/L (ref 134–144)
eGFR: 42 mL/min/{1.73_m2} — ABNORMAL LOW (ref >59)

## 2023-01-09 ENCOUNTER — Telehealth: Payer: Self-pay | Admitting: Adult Health

## 2023-01-09 ENCOUNTER — Other Ambulatory Visit: Payer: Self-pay | Admitting: Adult Health

## 2023-01-09 DIAGNOSIS — I6523 Occlusion and stenosis of bilateral carotid arteries: Secondary | ICD-10-CM

## 2023-01-09 NOTE — Telephone Encounter (Signed)
sent to GI they obtain [employer] medicare auth [id number] 

## 2023-01-17 ENCOUNTER — Encounter: Payer: Self-pay | Admitting: Adult Health

## 2023-01-19 NOTE — Progress Notes (Signed)
Carelink Summary Report / Loop Recorder 

## 2023-01-23 ENCOUNTER — Ambulatory Visit
Admission: RE | Admit: 2023-01-23 | Discharge: 2023-01-23 | Disposition: A | Discharge status: Home / Self Care | Payer: Medicare HMO | Source: Ambulatory Visit | Attending: Adult Health | Admitting: Adult Health

## 2023-01-23 DIAGNOSIS — I6523 Occlusion and stenosis of bilateral carotid arteries: Secondary | ICD-10-CM

## 2023-01-23 MED ORDER — IOPAMIDOL (ISOVUE-370) INJECTION 76%
75.0000 mL | Freq: Once | INTRAVENOUS | Status: AC | PRN
  Administered 2023-01-23: 75 mL via INTRAVENOUS

## 2023-02-07 ENCOUNTER — Ambulatory Visit: Payer: Medicare HMO

## 2023-02-07 DIAGNOSIS — I639 Cerebral infarction, unspecified: Secondary | ICD-10-CM

## 2023-02-07 LAB — CUP PACEART REMOTE DEVICE CHECK
Date Time Interrogation Session: 20240819230645
Implantable Pulse Generator Implant Date: 20230911

## 2023-02-17 NOTE — Progress Notes (Signed)
Carelink Summary Report / Loop Recorder 

## 2023-03-13 ENCOUNTER — Ambulatory Visit (INDEPENDENT_AMBULATORY_CARE_PROVIDER_SITE_OTHER): Payer: Medicare HMO

## 2023-03-13 DIAGNOSIS — I639 Cerebral infarction, unspecified: Secondary | ICD-10-CM | POA: Diagnosis not present

## 2023-03-14 LAB — CUP PACEART REMOTE DEVICE CHECK
Date Time Interrogation Session: 20240921230313
Implantable Pulse Generator Implant Date: 20230911

## 2023-03-27 NOTE — Progress Notes (Signed)
Carelink Summary Report / Loop Recorder 

## 2023-04-17 ENCOUNTER — Ambulatory Visit (INDEPENDENT_AMBULATORY_CARE_PROVIDER_SITE_OTHER): Payer: Medicare HMO

## 2023-04-17 DIAGNOSIS — I639 Cerebral infarction, unspecified: Secondary | ICD-10-CM

## 2023-04-18 LAB — CUP PACEART REMOTE DEVICE CHECK
Date Time Interrogation Session: 20241027231350
Implantable Pulse Generator Implant Date: 20230911

## 2023-05-08 NOTE — Progress Notes (Signed)
Carelink Summary Report / Loop Recorder 

## 2023-05-21 LAB — CUP PACEART REMOTE DEVICE CHECK
Date Time Interrogation Session: 20241129230639
Implantable Pulse Generator Implant Date: 20230911

## 2023-05-22 ENCOUNTER — Ambulatory Visit (INDEPENDENT_AMBULATORY_CARE_PROVIDER_SITE_OTHER): Payer: Medicare HMO

## 2023-05-22 DIAGNOSIS — I639 Cerebral infarction, unspecified: Secondary | ICD-10-CM

## 2023-06-15 ENCOUNTER — Telehealth: Payer: Self-pay | Admitting: *Deleted

## 2023-06-15 NOTE — Telephone Encounter (Signed)
   Name: Caleb Cardenas  DOB: May 01, 1956  MRN: [id number]  Primary Cardiologist: None Procedure date: 09/25/23  Chart reviewed as part of pre-operative protocol coverage. Because of [name] past medical history and time since last visit, he will require a follow-up in-office visit in order to better assess preoperative cardiovascular risk.  Pre-op covering staff: - Please schedule appointment and call patient to inform them. If patient already had an upcoming appointment within acceptable timeframe, please add "pre-op clearance" to the appointment notes so provider is aware. - Please contact requesting surgeon's office via preferred method (i.e, phone, fax) to inform them of need for appointment prior to surgery.  Please also notify surgeons office that patient is on Plavix for a history of CVA, recommendations regarding holding of Plavix should come from his neurologist or prescribing office.  Rip Harbour, NP  06/15/2023, 11:51 AM

## 2023-06-15 NOTE — Telephone Encounter (Signed)
   Pre-operative Risk Assessment    Patient Name: Caleb Cardenas  DOB: Jun 17, 1956 MRN: [id number]  DATE OF LAST VISIT: 02/28/22 DR. LAMBERT DATE OF NEXT VISIT: NONE    Request for Surgical Clearance    Procedure:   RIGHT TOTAL KNEE ARTHROPLASTY  Date of Surgery:  Clearance 09/25/23                                 Surgeon:  DR. Ollen Gross Surgeon's Group or Practice Name:  [facility] Phone number:  [phone] Aida Raider Fax number:  [phone]   Type of Clearance Requested:   - Medical  - Pharmacy:  Hold Clopidogrel (Plavix)     Type of Anesthesia:   CHOICE   Additional requests/questions:    Elpidio Anis   06/15/2023, 11:09 AM

## 2023-06-16 NOTE — Telephone Encounter (Signed)
Per our scheduling team pt has been scheduled NEW PT APPT PREOP CLEARANCE 08/21/23 with Dr. Odis Hollingshead.

## 2023-06-19 NOTE — Telephone Encounter (Signed)
Per preop protocol the appt cannot be more than 2 months ahead of the surgery date. Pt has appt 08/21/23 with Dr. Odis Hollingshead.

## 2023-06-19 NOTE — Telephone Encounter (Signed)
That's fine - you can arrange a sooner appt if needed.   Caleb Cardenas [place], DO, [facility]

## 2023-06-26 ENCOUNTER — Ambulatory Visit (INDEPENDENT_AMBULATORY_CARE_PROVIDER_SITE_OTHER): Payer: Medicare HMO

## 2023-06-26 DIAGNOSIS — I639 Cerebral infarction, unspecified: Secondary | ICD-10-CM | POA: Diagnosis not present

## 2023-06-27 ENCOUNTER — Telehealth: Payer: Self-pay | Admitting: *Deleted

## 2023-06-27 LAB — CUP PACEART REMOTE DEVICE CHECK
Date Time Interrogation Session: 20250105231339
Implantable Pulse Generator Implant Date: 20230911

## 2023-06-27 NOTE — Telephone Encounter (Signed)
 CALLED PATIENT TO INFORM OF CT FOR 06-30-23- ARRIVAL TIME- 5:15 PM  @ [facility], NO RESTRICTIONS TO SCAN, PATIENT TO RECEIVE RESULTS FROM ASHLYN BRUNING VIA TELEPHONE ON 07-05-23 @ 11:30 AM, LVM FOR A RETURN CALL

## 2023-06-30 ENCOUNTER — Ambulatory Visit (HOSPITAL_COMMUNITY): Payer: Medicare HMO

## 2023-07-05 ENCOUNTER — Ambulatory Visit: Payer: Medicare HMO | Admitting: Urology

## 2023-07-07 ENCOUNTER — Ambulatory Visit (HOSPITAL_COMMUNITY)
Admission: RE | Admit: 2023-07-07 | Discharge: 2023-07-07 | Disposition: A | Discharge status: Home / Self Care | Payer: Medicare HMO | Source: Ambulatory Visit | Attending: Urology | Admitting: Urology

## 2023-07-07 DIAGNOSIS — C37 Malignant neoplasm of thymus: Secondary | ICD-10-CM | POA: Insufficient documentation

## 2023-07-12 ENCOUNTER — Ambulatory Visit
Admission: RE | Admit: 2023-07-12 | Discharge: 2023-07-12 | Disposition: A | Discharge status: Home / Self Care | Payer: Medicare HMO | Source: Ambulatory Visit | Attending: Urology | Admitting: Urology

## 2023-07-12 ENCOUNTER — Encounter: Payer: Self-pay | Admitting: Urology

## 2023-07-12 VITALS — Ht 70.0 in | Wt 170.0 lb

## 2023-07-12 DIAGNOSIS — C37 Malignant neoplasm of thymus: Secondary | ICD-10-CM

## 2023-07-12 NOTE — Progress Notes (Signed)
Telephone nursing appointment for review of most recent CT-CHEST results. I verified patient's identity x2 and began nursing interview.   Patient reports doing well. Patient denies any related issues at this time.   Meaningful use complete.   Patient aware of their 10:30am-07/12/2023 telephone appointment w/ Ashlyn Bruning PA-C. I left my extension [phone] in case patient needs anything. Patient verbalized understanding. This concludes the nursing interview.   Patient contact [phone]     Ruel Favors, LPN

## 2023-07-12 NOTE — Progress Notes (Signed)
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 male seen at the request of Dr. Cliffton Asters. He was initially seen in the ED on 04/19/21 with stroke-like symptoms. While undergoing angio head/neck CT, he was found to have a partially-imaged mass in the anterior mediastinum. The mass was previously seen in 2017 and grew in size to 4.3 cm. He was referred to Dr. Tonia Brooms and proceeded with recommended PET scan on 07/12/21. The PET scan showed: 4.2 cm anterior mediastinal mass with mildly abnormal hypermetabolism, mildly enlarged right hilar lymph node with mildly accentuated metabolic activity and faintly accentuated right infrahilar activity. He also met with Dr. Cliffton Asters to discuss his surgical options. The recommendation was to proceed with bronchoscopy and biopsy, but he failed to follow up. He later returned to Dr. Tonia Brooms in 02/2022. A repeat chest CT on 03/23/22 showed no change in the anterior mediastinal mass as compared to the prior PET imaging so he met back with Dr. Cliffton Asters in 04/2022 and they agreed to forego biopsy and move forward with resection of the mass which was performed on 06/09/22. Final surgical pathology confirmed a 4.2 cm thymoma, type B2, pT1a, with negative margins. He was kindly referred to [place] in 06/28/2022, to discuss the potential role for post-operative radiotherapy in the management of his disease. We reviewed the NCCN guidelines for pT1a Thymoma which recommends post-operative observation only, with CT Chest imaging every 6 months for the first 2 years and then annually until 10 years disease free.  He was comfortable and in agreement with that plan and has been diligent  in his surveillance follow up without any evidence of disease recurrence or progression of follow up imaging. His most recent CT Chest was performed 07/07/23 and remains stable. We reviewed these results by telephone today.   PREVIOUS RADIATION THERAPY: No                            On review of systems, the patient states hat he is doing well in general and is without complaints. He specifically denies any chest pain, shortness of breath, productive cough or hemoptysis. He has not had recent fevers, chills or night sweats. His energy level remains good and he is staying active. He denies nausea, vomiting or diarrhea. He has a healthy appetite and is maintaining his weight. Overall, he is pleased with his progress to date.  ALLERGIES:  has no known allergies.  Meds: Current Outpatient Medications  Medication Sig Dispense Refill   amLODipine (NORVASC) 10 MG tablet TAKE 1 TABLET BY MOUTH EVERY DAY 90 tablet 1   atorvastatin (LIPITOR) 80 MG tablet Take 1 tablet (80 mg total) by mouth daily. 90 tablet 3   clopidogrel (PLAVIX) 75 MG tablet Take 1 tablet (75 mg total) by mouth daily. 90 tablet 3   metoprolol succinate (TOPROL-XL) 25 MG 24 hr tablet TAKE 1 TABLET (25 MG TOTAL) BY MOUTH DAILY. 90 tablet 2   Multiple Vitamin (MULTIVITAMIN ADULT) TABS Take 1 tablet by mouth 3 (three) times a week.     Omega 3 1000 MG CAPS Take 1,000 mg  by mouth once a week.     No current facility-administered medications for this encounter.    Physical Findings:  height is 5\' 10"  (1.778 m) and weight is 170 lb (77.1 kg).  Pain Assessment Pain Score: 0-No pain/10 Unable to assess due to telephone follow up visit format.  Lab Findings: Lab Results  Component Value Date   WBC 9.0 06/11/2022   HGB 11.6 (L) 06/11/2022   HCT 33.9 (L) 06/11/2022   MCV 89.2 06/11/2022   PLT 144 (L) 06/11/2022     Radiographic Findings: CT Chest Wo Contrast Result Date: 07/11/2023 CLINICAL DATA:  Restaging for type B2 thymoma  status post surgical resection 06/09/2022. * Tracking Code: BO * EXAM: CT CHEST WITHOUT CONTRAST TECHNIQUE: Multidetector CT imaging of the chest was performed following the standard protocol without IV contrast. RADIATION DOSE REDUCTION: This exam was performed according to the departmental dose-optimization program which includes automated exposure control, adjustment of the mA and/or kV according to patient size and/or use of iterative reconstruction technique. COMPARISON:  Chest CT 03/23/2022. Chest MRI 08/08/2021 and PET-CT 07/12/2021. FINDINGS: Cardiovascular: Atherosclerosis of the aorta, great vessels and coronary arteries. Probable left anterior descending coronary artery stent. The heart size is normal. There is no pericardial effusion. Mediastinum/Nodes: There are no enlarged mediastinal, hilar or axillary lymph nodes.Multiple chronically calcified mediastinal and hilar lymph nodes bilaterally are grossly unchanged. Stable mild soft tissue thickening in the prevascular space at the site of the previously resected thymoma. No recurrent soft tissue mass. The thyroid gland, trachea and esophagus demonstrate no significant findings. Lungs/Pleura: No pleural effusion or pneumothorax. Mild centrilobular and paraseptal emphysema with stable linear scarring in the lingula. No airspace disease or suspicious pulmonary nodularity. Upper abdomen: The visualized upper abdomen appears stable, without acute findings. There are multiple small calcified gallstones. Musculoskeletal/Chest wall: There is no chest wall mass or suspicious osseous finding. Mild thoracic spondylosis. Loop recorder noted within the left anterior chest wall. IMPRESSION: 1. Stable postoperative changes in the mediastinum without evidence of recurrent or metastatic disease. 2. Stable chronically calcified mediastinal and hilar lymph nodes, likely sequelae of prior granulomatous disease. 3. Cholelithiasis. 4. Aortic Atherosclerosis (ICD10-I70.0) and  Emphysema (ICD10-J43.9). Electronically Signed   By: Carey Bullocks M.D.   On: 07/11/2023 16:57   CUP PACEART REMOTE DEVICE CHECK Result Date: 06/27/2023 ILR summary report received. Battery status OK. Normal device function. No new symptom, tachy, brady, or pause episodes. No new AF episodes. Monthly summary reports and ROV/PRN LA, CVRS   Impression/Plan: 1.  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 His follow up post-operative imaging continues to show no evidence of disease recurrence or progression so we discussed the plan to continue with the serial CT Chest imaging every 6 months for the first 2 years and then annually until 10 years disease free, as per NCCN guidelines. He understands that should he have any disease recurrence noted on follow up imaging in the future, we would proceed with a 5.5 week course of daily radiotherapy at that time.  He is comfortable and in agreement with the plan to continue with surveillance and knows that he is welcome to call at any time with questions or concerns that come up in the interim.    Marguarite Arbour, PA-C

## 2023-07-31 ENCOUNTER — Ambulatory Visit: Payer: Medicare HMO

## 2023-07-31 DIAGNOSIS — I639 Cerebral infarction, unspecified: Secondary | ICD-10-CM | POA: Diagnosis not present

## 2023-08-01 LAB — CUP PACEART REMOTE DEVICE CHECK
Date Time Interrogation Session: 20250209231636
Implantable Pulse Generator Implant Date: 20230911

## 2023-08-07 NOTE — Addendum Note (Signed)
Addended by: Geralyn Flash D on: 08/07/2023 10:48 AM   Modules accepted: Orders

## 2023-08-07 NOTE — Progress Notes (Signed)
 Carelink Summary Report / Loop Recorder

## 2023-08-21 ENCOUNTER — Ambulatory Visit: Payer: Medicare HMO | Attending: Cardiology | Admitting: Cardiology

## 2023-08-21 ENCOUNTER — Encounter: Payer: Self-pay | Admitting: Cardiology

## 2023-08-21 VITALS — BP 182/80 | HR 56 | Resp 16 | Ht 70.0 in | Wt 173.8 lb

## 2023-08-21 DIAGNOSIS — E785 Hyperlipidemia, unspecified: Secondary | ICD-10-CM

## 2023-08-21 DIAGNOSIS — Z01810 Encounter for preprocedural cardiovascular examination: Secondary | ICD-10-CM | POA: Diagnosis not present

## 2023-08-21 DIAGNOSIS — I700 Atherosclerosis of aorta: Secondary | ICD-10-CM

## 2023-08-21 DIAGNOSIS — I2510 Atherosclerotic heart disease of native coronary artery without angina pectoris: Secondary | ICD-10-CM

## 2023-08-21 MED ORDER — AMLODIPINE BESYLATE 10 MG PO TABS: 10.0000 mg | ORAL_TABLET | Freq: Every day | ORAL | 1 refills | Status: AC

## 2023-08-21 NOTE — Patient Instructions (Addendum)
 Medication Instructions:  Your physician recommends that you continue on your current medications as directed. Please refer to the Current Medication list given to you today.  A one-time refill for your Amlodipine 10 mg has been sent to your pharmacy.   Take your Lisinopril in the morning.   *If you need a refill on your cardiac medications before your next appointment, please call your pharmacy*  Lab Work: None ordered today. If you have labs (blood work) drawn today and your tests are completely normal, you will receive your results only by: MyChart Message (if you have MyChart) OR A paper copy in the mail If you have any lab test that is abnormal or we need to change your treatment, we will call you to review the results.  Testing/Procedures: Your physician has requested that you have an echocardiogram. Echocardiography is a painless test that uses sound waves to create images of your heart. It provides your doctor with information about the size and shape of your heart and how well your heart's chambers and valves are working. This procedure takes approximately one hour. There are no restrictions for this procedure. Please do NOT wear cologne, perfume, aftershave, or lotions (deodorant is allowed). Please arrive 15 minutes prior to your appointment time.  Please note: We ask at that you not bring children with you during ultrasound (echo/ vascular) testing. Due to room size and safety concerns, children are not allowed in the ultrasound rooms during exams. Our front office staff cannot provide observation of children in our lobby area while testing is being conducted. An adult accompanying a patient to their appointment will only be allowed in the ultrasound room at the discretion of the ultrasound technician under special circumstances. We apologize for any inconvenience.   ---------------------------------------------------------------------------------- Your physician has requested for  you to have cardiac PET/CT stress test. Someone will call you to schedule this at a later date.   Follow-Up: At [facility], you and your health needs are our priority.  As part of our continuing mission to provide you with exceptional heart care, we have created designated Provider Care Teams.  These Care Teams include your primary Cardiologist (physician) and Advanced Practice Providers (APPs -  Physician Assistants and Nurse Practitioners) who all work together to provide you with the care you need, when you need it.  We recommend signing up for the patient portal called "MyChart".  Sign up information is provided on this After Visit Summary.  MyChart is used to connect with patients for Virtual Visits (Telemedicine).  Patients are able to view lab/test results, encounter notes, upcoming appointments, etc.  Non-urgent messages can be sent to your provider as well.   To learn more about what you can do with MyChart, go to [email or web address].    Your next appointment:   3 month(s)  The format for your next appointment:   In Person  Provider:   Tessa Lerner, DO {  Other Instructions How to Prepare for Your Cardiac PET/CT Stress Test:  1. Please do not take these medications before your test:  ~Medications that may interfere with the cardiac pharmacological stress agent (ex. nitrates - including erectile dysfunction medications, isosorbide mononitrate, tamulosin or beta-blockers) the day of the exam. (Erectile dysfunction medication should be held for at least 72 hrs prior to test)  *HOLD Metoprolol the day of the scan*  ~Theophylline containing medications for 12 hours. ~Dipyridamole 48 hours prior to the test. ~Your remaining medications may be taken with water.  2. Nothing to  eat or drink, except water, 3 hours prior to arrival time.   ~ NO caffeine/decaffeinated products, or chocolate 12 hours prior to arrival.  3. NO perfume, cologne or lotion on chest or abdomen area.          4. Total time is 1 to 2 hours; you may want to bring reading material for the waiting time.  Please report to Radiology at the [facility] Main Entrance 30 minutes early for your test. [address] [place], [place] [address]  In preparation for your appointment, medication and supplies will be purchased.  Appointment availability is limited, so if you need to cancel or reschedule, please call the Radiology Department at [phone] [facility])  24 hours in advance to avoid a cancellation fee of $100.00  What to Expect After you Arrive:  Once you arrive and check in for your appointment, you will be taken to a preparation room within the Radiology Department.  A technologist or Nurse will obtain your medical history, verify that you are correctly prepped for the exam, and explain the procedure.  Afterwards,  an IV will be started in your arm and electrodes will be placed on your skin for EKG monitoring during the stress portion of the exam. Then you will be escorted to the PET/CT scanner.  There, staff will get you positioned on the scanner and obtain a blood pressure and EKG.  During the exam, you will continue to be connected to the EKG and blood pressure machines.  A small, safe amount of a radioactive tracer will be injected in your IV to obtain a series of pictures of your heart along with an injection of a stress agent.    After your Exam:  It is recommended that you eat a meal and drink a caffeinated beverage to counter act any effects of the stress agent.  Drink plenty of fluids for the remainder of the day and urinate frequently for the first couple of hours after the exam.  Your doctor will inform you of your test results within 7-10 business days.  For more information and frequently asked questions, please visit our website : [email or web address]  For questions about your test or how to prepare for your test, please call: Cardiac Imaging Nurse  Navigators Office: [phone]

## 2023-08-21 NOTE — Progress Notes (Signed)
 Cardiology Office Note:     NAME:  Caleb Cardenas    MRN: [id number] DOB:  1956/02/15   PCP:  Salli Real, MD  Former Cardiology Providers: N/A Primary Cardiologist:  Tessa Lerner, DO, [facility] (established care 08/21/2023) Electrophysiologist:  Lanier Prude, MD   Referring MD: Salli Real, MD  Reason of Consult: Preoperative risk stratification.  Chief Complaint  Patient presents with   Pre-op Exam    Establish care    History of Present Illness:    Caleb Cardenas is a 68 y.o.  male whose past medical history and cardiovascular risk factors includes: Hypertension, cryptogenic stroke, thymoma, aortic atherosclerosis, emphysema. He is being seen today for the evaluation of Preoperative risk stratification prior to right knee replacement at the request of Salli Real, MD.  He is scheduled for knee replacement surgery on April 7th 2025 and requires a pre-operative cardiac evaluation.  He has not been evaluated by general cardiology and I am seeing him for the first time.  He has a long history of knee issues, dating back to the 1970s when he had cartilage removed from his knees. He experiences significant limitations in physical activity due to knee pain, avoiding activities like grocery shopping because of the hard floors. He has not participated in sports for the last 20 years. Both knees have been operated on previously but has not undergone knee replacement as of yet.  He has a history of a stroke, discovered incidentally on a CT scan. He does not recall experiencing any symptoms at the time of the stroke and was not hospitalized for it. The stroke was discovered when he went to the hospital due to dizziness. He is on Plavix for secondary prevention.  No current chest pain, shortness of breath, lightheadedness, dizziness, or blood in urine or stool. He also denies any trouble laying flat at night or waking up gasping for air.  He has a history of high cholesterol and is currently on  atorvastatin. He is also on lisinopril and metoprolol for blood pressure management. His blood pressure is usually around 140/90 when checked at the doctor's office, but it was high during today's visit. He missed a dose of his blood pressure medication recently. He has not been checking his blood pressure at home and is unsure about his current cholesterol levels. His last triglyceride level in 2021 was 621 mg/dL, which is significantly elevated.  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 He has not had any stents placed in the arteries.  He quit smoking in December 2023 after smoking for many years.  Current Medications: Current Meds  Medication Sig   atorvastatin (LIPITOR) 80 MG tablet Take 1 tablet (80 mg total) by mouth daily.   clopidogrel (PLAVIX) 75 MG tablet Take 1 tablet (75 mg total) by mouth daily.   lisinopril (ZESTRIL) 10 MG tablet Take 10 mg by mouth daily.   metoprolol succinate (TOPROL-XL) 25 MG 24 hr tablet TAKE 1 TABLET (25 MG TOTAL) BY MOUTH DAILY.   Multiple Vitamin (MULTIVITAMIN ADULT) TABS Take 1 tablet by mouth 3 (three) times a week.   Omega 3 1000 MG CAPS Take 1,000 mg by mouth once a week.     Allergies:    Patient has no known allergies.   Past Medical History: Past Medical History:  Diagnosis Date   Blurred vision    Carotid artery disease (HCC)    Dizziness    Hypertension  Mediastinal mass    Stroke (cerebrum) (HCC) 03/2020   Vertebral basilar insufficiency    Vertigo     Past Surgical History: Past Surgical History:  Procedure Laterality Date   KNEE SURGERY     x2  in high school   TONSILLECTOMY     as a child    Social History: Social History   Tobacco Use   Smoking status: Every Day    Current packs/day: 1.00    Types: Cigarettes    Passive exposure: Never   Smokeless tobacco: Never   Tobacco comments:    Smoking 15-20 cigs a day. 03/14/2022 Tay  Vaping Use   Vaping status: Never Used   Substance Use Topics   Alcohol use: Yes    Comment: occ   Drug use: No    Family History: Family History  Problem Relation Age of Onset   Heart attack Mother    Alzheimer's disease Father    Pancreatic cancer Brother     ROS:   Review of Systems  Cardiovascular:  Negative for chest pain, claudication, irregular heartbeat, leg swelling, near-syncope, orthopnea, palpitations, paroxysmal nocturnal dyspnea and syncope.  Respiratory:  Negative for shortness of breath.   Hematologic/Lymphatic: Negative for bleeding problem.    EKGs/Labs/Other Studies Reviewed:   EKG: EKG Interpretation Date/Time:  Monday August 21 2023 15:56:56 EST Ventricular Rate:  52 PR Interval:  194 QRS Duration:  104 QT Interval:  466 QTC Calculation: 433 R Axis:   30  Text Interpretation: Sinus bradycardia When compared with ECG of 07-Jun-2022 13:39, Premature ventricular complexes are no longer Present QT has shortened Confirmed by Tessa Lerner [phone]) on 08/21/2023 4:01:29 PM  Echocardiogram: 04/2021:  1. Hypokinesis of the inferior wall (base, mid). Left ventricular  ejection fraction, by estimation, is 55%. The left ventricle has normal  function. There is mild concentric left ventricular hypertrophy. Left  ventricular diastolic parameters are  consistent with Grade I diastolic dysfunction (impaired relaxation).  Elevated left atrial pressure.   2. Right ventricular systolic function is normal. The right ventricular  size is normal.   3. Multiple jets of MR. The mitral valve is myxomatous. Mild mitral valve  regurgitation.   4. The aortic valve is tricuspid. Aortic valve regurgitation is mild to  moderate. Mild to moderate aortic valve sclerosis/calcification is  present, without any evidence of aortic stenosis.   5. The inferior vena cava is normal in size with greater than 50%  respiratory variability, suggesting right atrial pressure of 3 mmHg.   Labs:    Latest Ref Rng & Units 06/11/2022    12:27 AM 06/10/2022   12:18 AM 06/07/2022    2:00 PM  CBC  WBC 4.0 - 10.5 K/uL 9.0  8.5  7.3   Hemoglobin 13.0 - 17.0 g/dL 60.4  54.0  98.1   Hematocrit 39.0 - 52.0 % 33.9  35.3  46.5   Platelets 150 - 400 K/uL 144  132  149        Latest Ref Rng & Units 01/06/2023   10:15 AM 06/11/2022    1:25 AM 06/10/2022   12:18 AM  BMP  Glucose 70 - 99 mg/dL 191  91  478   BUN 8 - 27 mg/dL 21  47  44   Creatinine 0.76 - 1.27 mg/dL 2.95  6.21  3.08   BUN/Creat Ratio 10 - 24 12     Sodium 134 - 144 mmol/L 140  137  136   Potassium 3.5 - 5.2  mmol/L 3.4  3.5  3.6   Chloride 96 - 106 mmol/L 105  109  104   CO2 20 - 29 mmol/L 21  21  22    Calcium 8.6 - 10.2 mg/dL 8.5  8.0  7.8       Latest Ref Rng & Units 01/06/2023   10:15 AM 06/11/2022    1:25 AM 06/10/2022   12:18 AM  CMP  Glucose 70 - 99 mg/dL 621  91  308   BUN 8 - 27 mg/dL 21  47  44   Creatinine 0.76 - 1.27 mg/dL 6.57  8.46  9.62   Sodium 134 - 144 mmol/L 140  137  136   Potassium 3.5 - 5.2 mmol/L 3.4  3.5  3.6   Chloride 96 - 106 mmol/L 105  109  104   CO2 20 - 29 mmol/L 21  21  22    Calcium 8.6 - 10.2 mg/dL 8.5  8.0  7.8   Total Protein 6.5 - 8.1 g/dL  5.9    Total Bilirubin 0.3 - 1.2 mg/dL  0.7    Alkaline Phos 38 - 126 U/L  73    AST 15 - 41 U/L  28    ALT 0 - 44 U/L  8      Lab Results  Component Value Date   CHOL 199 04/21/2020   HDL 25 (L) 04/21/2020   LDLCALC 76 04/21/2020   TRIG 621 (HH) 04/21/2020   CHOLHDL 8.0 (H) 04/21/2020   No results for input(s): "LIPOA" in the last 8760 hours. No components found for: "NTPROBNP" No results for input(s): "PROBNP" in the last 8760 hours. No results for input(s): "TSH" in the last 8760 hours.  Physical Exam:    Today's Vitals   08/21/23 1555  BP: (!) 182/80  Pulse: (!) 56  Resp: 16  SpO2: 97%  Weight: 173 lb 12.8 oz (78.8 kg)  Height: 5\' 10"  (1.778 m)   Body mass index is 24.94 kg/m. Wt Readings from Last 3 Encounters:  08/21/23 173 lb 12.8 oz (78.8 kg)   07/12/23 170 lb (77.1 kg)  09/20/22 168 lb (76.2 kg)    Physical Exam  Constitutional: No distress.  hemodynamically stable  Neck: No JVD present.  Cardiovascular: Normal rate, regular rhythm, S1 normal and S2 normal. Exam reveals no gallop, no S3 and no S4.  No murmur heard. Pulmonary/Chest: Effort normal and breath sounds normal. No stridor. He has no wheezes. He has no rales.  Musculoskeletal:        General: No edema.     Cervical back: Neck supple.  Skin: Skin is warm.     Impression & Recommendation(s):  Impression:   ICD-10-CM   1. Coronary artery calcification  I25.10 ECHOCARDIOGRAM COMPLETE    NM PET CT CARDIAC PERFUSION MULTI W/ABSOLUTE BLOODFLOW    2. Atherosclerosis of aorta (HCC)  I70.0 NM PET CT CARDIAC PERFUSION MULTI W/ABSOLUTE BLOODFLOW    3. Preop cardiovascular exam  Z01.810 EKG 12-Lead    ECHOCARDIOGRAM COMPLETE    NM PET CT CARDIAC PERFUSION MULTI W/ABSOLUTE BLOODFLOW    4. Hyperlipidemia, unspecified hyperlipidemia type  E78.5 ECHOCARDIOGRAM COMPLETE    NM PET CT CARDIAC PERFUSION MULTI W/ABSOLUTE BLOODFLOW       Recommendation(s):  Preop cardiovascular exam Preoperative Evaluation for Right Knee Replacement No current cardiac symptoms. History of stroke and carotid artery stenosis. Hypertension not well controlled. High cholesterol with elevated triglycerides in 2021. Limited physical activity due to knee pain. Surgery  scheduled for 09/25/2023. -Order cardiac PET/CT to assess cardiac risk prior to surgery. -Echo will be ordered to evaluate for structural heart disease and left ventricular systolic function. -Add Amlodipine 10mg  daily for blood pressure control until primary care appointment. -Advise patient to monitor blood pressure at home and discuss with primary care provider. -Recommend follow-up with primary care provider for management of hypertension and hyperlipidemia. -Plan to see patient in 3 months or sooner if stress test is  abnormal.  Hyperlipidemia, unspecified hyperlipidemia type Lipids were uncontrolled back in 2021. I do not have any recent labs for review. Advised him to have it rechecked with PCP and initiate pharmacological therapy based on results.  Of note he has documented history of coronary calcification and aortic atherosclerosis. Patient states that he will follow-up with PCP and have his lipids rechecked  Coronary artery calcification Atherosclerosis of aorta (HCC) Currently on Plavix 75 mg p.o. daily. Currently on atorvastatin 80 mg p.o. nightly. Cardiovascular workup as discussed above Reemphasized the importance of secondary prevention with focus on improving her modifiable cardiovascular risk factors such as glycemic control, lipid management, blood pressure control, weight loss.  Remote loop transmission from February 2025 results reviewed-battery status is okay, normal device function, no new A-fib episodes.  Patient has known history of right vertebral artery stenosis, left vertebral artery stenosis as well as left ICA disease (see CT results).  Given his history of stroke based on imaging study recommended that he follows up with at least neurology or neuroradiology.  Patient states that he will discuss with PCP at the next office visit.  From a cardiology standpoint we will provide preoperative risk assessment based on results of stress test and echocardiogram.  Remainder of risk stratification/clearance will be deferred to PCP  Orders Placed:  Orders Placed This Encounter  Procedures   NM PET CT CARDIAC PERFUSION MULTI W/ABSOLUTE BLOODFLOW    Standing Status:   Future    Expiration Date:   08/20/2024    If indicated for the ordered procedure, I authorize the administration of a radiopharmaceutical per Radiology protocol:   Yes   EKG 12-Lead   ECHOCARDIOGRAM COMPLETE    Standing Status:   Future    Expected Date:   08/28/2023    Expiration Date:   08/20/2024    Where should this test  be performed:   [facility])    Does the patient weigh less than or greater than 250 lbs?:   Patient weighs less than 250 lbs    Perflutren DEFINITY (image enhancing agent) should be administered unless hypersensitivity or allergy exist:   Administer Perflutren    Reason for exam-Echo:   Other-Full Diagnosis List    Full ICD-10/Reason for Exam:   Coronary artery calcification [1610960]    Full ICD-10/Reason for Exam:   Pre-operative clearance [287138]    Full ICD-10/Reason for Exam:   HLD (hyperlipidemia) [454098]    Final Medication List:    Meds ordered this encounter  Medications   amLODipine (NORVASC) 10 MG tablet    Sig: Take 1 tablet (10 mg total) by mouth daily.    Dispense:  90 tablet    Refill:  1    Medications Discontinued During This Encounter  Medication Reason   amLODipine (NORVASC) 10 MG tablet Reorder     Current Outpatient Medications:    atorvastatin (LIPITOR) 80 MG tablet, Take 1 tablet (80 mg total) by mouth daily., Disp: 90 tablet, Rfl: 3   clopidogrel (PLAVIX) 75 MG  tablet, Take 1 tablet (75 mg total) by mouth daily., Disp: 90 tablet, Rfl: 3   lisinopril (ZESTRIL) 10 MG tablet, Take 10 mg by mouth daily., Disp: , Rfl:    metoprolol succinate (TOPROL-XL) 25 MG 24 hr tablet, TAKE 1 TABLET (25 MG TOTAL) BY MOUTH DAILY., Disp: 90 tablet, Rfl: 2   Multiple Vitamin (MULTIVITAMIN ADULT) TABS, Take 1 tablet by mouth 3 (three) times a week., Disp: , Rfl:    Omega 3 1000 MG CAPS, Take 1,000 mg by mouth once a week., Disp: , Rfl:    amLODipine (NORVASC) 10 MG tablet, Take 1 tablet (10 mg total) by mouth daily., Disp: 90 tablet, Rfl: 1  Consent:   Informed Consent   Shared Decision Making/Informed Consent The risks [chest pain, shortness of breath, cardiac arrhythmias, dizziness, blood pressure fluctuations, myocardial infarction, stroke/transient ischemic attack, nausea, vomiting, allergic reaction, radiation exposure, metallic taste sensation and  life-threatening complications (estimated to be 1 in 10,000)], benefits (risk stratification, diagnosing coronary artery disease, treatment guidance) and alternatives of a cardiac PET stress test were discussed in detail with Mr. Brazel and he agrees to proceed.      Disposition:   3 months sooner if needed.   Patient may be asked to follow-up sooner based on the results of the above-mentioned testing.  His questions and concerns were addressed to his satisfaction. He voices understanding of the recommendations provided during this encounter.    Signed, Tessa Lerner, DO, [facility] [place]  [facility]  [address] [place], [place] [address]

## 2023-08-29 NOTE — H&P (Addendum)
 TOTAL KNEE ADMISSION H&P  Patient is being admitted for right total knee arthroplasty.  Subjective:  Chief Complaint: Right knee pain.  HPI: Caleb Cardenas, 68 y.o. male has a history of pain and functional disability in the right knee due to arthritis and has failed non-surgical conservative treatments for greater than 12 weeks to include NSAID's and/or analgesics, corticosteriod injections, and activity modification. Onset of symptoms was gradual, starting  several  years ago with gradually worsening course since that time. The patient noted no past surgery on the right knee.  Patient currently rates pain in the right knee at 8 out of 10 with activity. Patient has night pain, worsening of pain with activity and weight bearing, pain with passive range of motion, and crepitus. Patient has evidence of  end-stage bone-on-bone arthritis in the lateral and patellofemoral compartments, accompanied by a large valgus deformity  by imaging studies. There is no active infection.  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   Mediastinal mass 06/09/2022   Vertebrobasilar insufficiency    Hypertensive urgency 04/19/2021   Dizziness 04/19/2021   CKD (chronic kidney disease) stage 3, GFR 30-59 ml/min (HCC) 04/19/2021   Mass of mediastinum     Past Medical History:  Diagnosis Date   Blurred vision    Carotid artery disease (HCC)    Dizziness    Hypertension    Mediastinal mass    Stroke (cerebrum) (HCC) 03/2020   Vertebral basilar insufficiency    Vertigo     Past Surgical History:  Procedure Laterality Date   KNEE SURGERY     x2  in high school   TONSILLECTOMY     as a child    Prior to Admission medications   Medication Sig Start Date End Date Taking? Authorizing Provider  amLODipine (NORVASC) 10 MG tablet Take 1 tablet (10 mg total) by mouth daily. 08/21/23   Tolia, Sunit, DO  atorvastatin (LIPITOR) 80 MG tablet Take 1 tablet (80 mg total) by  mouth daily. 09/20/22   Ihor Austin, NP  clopidogrel (PLAVIX) 75 MG tablet Take 1 tablet (75 mg total) by mouth daily. 09/20/22   Ihor Austin, NP  lisinopril (ZESTRIL) 10 MG tablet Take 10 mg by mouth daily.    [provider]  metoprolol succinate (TOPROL-XL) 25 MG 24 hr tablet TAKE 1 TABLET (25 MG TOTAL) BY MOUTH DAILY. 06/22/22   Lanier Prude, MD  Multiple Vitamin (MULTIVITAMIN ADULT) TABS Take 1 tablet by mouth 3 (three) times a week.    [provider]  Omega 3 1000 MG CAPS Take 1,000 mg by mouth once a week.    [provider]    No Known Allergies  Social History   Socioeconomic History   Marital status: Divorced    Spouse name: Not on file   Number of children: 1   Years of education: Not on file   Highest education level: Some college, no degree  Occupational History    Comment: house flipping, painting  Tobacco Use   Smoking status: Every Day    Current packs/day: 1.00    Types: Cigarettes    Passive exposure: Never   Smokeless tobacco: Never   Tobacco comments:    Smoking 15-20 cigs a day. 03/14/2022 Tay  Vaping Use   Vaping status: Never Used  Substance and Sexual Activity   Alcohol use: Yes    Comment: occ   Drug use: No   Sexual  activity: Yes  Other Topics Concern   Not on file  Social History Narrative   Lives alone   Caffeine- sodas 2-3 a day   Social Drivers of Health   Financial Resource Strain: Not on file  Food Insecurity: No Food Insecurity (07/12/2023)   Hunger Vital Sign    Worried About Running Out of Food in the Last Year: Never true    Ran Out of Food in the Last Year: Never true  Transportation Needs: No Transportation Needs (07/12/2023)   PRAPARE - Administrator, Civil Service (Medical): No    Lack of Transportation (Non-Medical): No  Physical Activity: Not on file  Stress: Not on file  Social Connections: Not on file  Intimate Partner Violence: Not At Risk (07/12/2023)   Humiliation, Afraid,  Rape, and Kick questionnaire    Fear of Current or Ex-Partner: No    Emotionally Abused: No    Physically Abused: No    Sexually Abused: No    Tobacco Use: High Risk (08/21/2023)   Patient History    Smoking Tobacco Use: Every Day    Smokeless Tobacco Use: Never    Passive Exposure: Never   Social History   Substance and Sexual Activity  Alcohol Use Yes   Comment: occ    Family History  Problem Relation Age of Onset   Heart attack Mother    Alzheimer's disease Father    Pancreatic cancer Brother     Review of Systems  Constitutional:  Negative for chills and fever.  HENT:  Negative for congestion, sore throat and tinnitus.   Eyes:  Negative for double vision, photophobia and pain.  Respiratory:  Negative for cough, shortness of breath and wheezing.   Cardiovascular:  Negative for chest pain, palpitations and orthopnea.  Gastrointestinal:  Negative for heartburn, nausea and vomiting.  Genitourinary:  Negative for dysuria, frequency and urgency.  Musculoskeletal:  Positive for joint pain.  Neurological:  Negative for dizziness, weakness and headaches.    Objective:  Physical Exam: Well nourished and well developed.  General: Alert and oriented x3, cooperative and pleasant, no acute distress.  Head: normocephalic, atraumatic, neck supple.  Eyes: EOMI.  Musculoskeletal:  Right knee shows a significant valgus deformity. Range of motion right knee is 0-130. Tender laterally. No medial tenderness. Stressing the knee Corrects his valgus deformity from about 20 to 10 degrees, but there is no gross laxity noted.     Calves soft and nontender. Motor function intact in LE. Strength 5/5 LE bilaterally. Neuro: Distal pulses 2+. Sensation to light touch intact in LE.   Imaging Review Plain radiographs demonstrate severe degenerative joint disease of the right knee. The overall alignment is significant valgus. The bone quality appears to be adequate for age and reported activity  level.  Assessment/Plan:  End stage arthritis, right knee   The patient history, physical examination, clinical judgment of the provider and imaging studies are consistent with end stage degenerative joint disease of the right knee and total knee arthroplasty is deemed medically necessary. The treatment options including medical management, injection therapy arthroscopy and arthroplasty were discussed at length. The risks and benefits of total knee arthroplasty were presented and reviewed. The risks due to aseptic loosening, infection, stiffness, patella tracking problems, thromboembolic complications and other imponderables were discussed. The patient acknowledged the explanation, agreed to proceed with the plan and consent was signed. Patient is being admitted for inpatient treatment for surgery, pain control, PT, OT, prophylactic antibiotics, VTE prophylaxis, progressive ambulation  and ADLs and discharge planning. The patient is planning to be discharged  home .   Patient's anticipated LOS is less than 2 midnights, meeting these requirements: - Lives within 1 hour of care - Has a competent adult at home to recover with post-op recover - NO history of  - Chronic pain requiring opiods  - Diabetes  - Heart failure  - Heart attack  - DVT/VTE  - Cardiac arrhythmia  - Respiratory Failure/COPD  - Renal failure  - Anemia  - Advanced Liver disease  Therapy Plans: Outpatient therapy at [facility] Disposition: Home with daughter Planned DVT Prophylaxis: Plavix + ASA 325 mg QD DME Needed: None PCP: Dr. Salli Real (clearance received) Cardiologist: Tessa Lerner, MD (LOV 08/21/23 - lexiscan scheduled 3/20) TXA: IV Allergies: NKDA Metal Allergy: None Anesthesia Concerns: None BMI: 23 Last HgbA1c: Not diabetic Pain Regimen: Oxycodone, tramadol Pharmacy: [facility] ([address])   - Patient was instructed on what medications to stop prior to surgery. - Follow-up visit in 2 weeks with Dr.  Lequita Halt - Begin physical therapy following surgery - Pre-operative lab work as pre-surgical testing - Prescriptions will be provided in hospital at time of discharge  Arther Abbott, PA-C Orthopedic Surgery [facility] [place]

## 2023-08-30 ENCOUNTER — Encounter: Payer: Self-pay | Admitting: Cardiology

## 2023-09-01 ENCOUNTER — Telehealth: Payer: Self-pay | Admitting: Cardiology

## 2023-09-01 DIAGNOSIS — Z01810 Encounter for preprocedural cardiovascular examination: Secondary | ICD-10-CM

## 2023-09-01 DIAGNOSIS — I700 Atherosclerosis of aorta: Secondary | ICD-10-CM

## 2023-09-01 DIAGNOSIS — I2510 Atherosclerotic heart disease of native coronary artery without angina pectoris: Secondary | ICD-10-CM

## 2023-09-01 NOTE — Telephone Encounter (Signed)
-----   Message from Donnie Coffin sent at 09/01/2023 10:23 AM EDT ----- Regarding: RE: Lexiscan for procedure clearance I will need an order for Myoview. I can accommodate patient easily. :) ----- Message ----- From: Cherylann Banas, RN Sent: 09/01/2023   9:16 AM EDT To: Minette Brine Subject: Eugenie Birks for procedure clearance               [place]! We have this pt that was denied for a PET/CT and was needing it for clearance for a procedure that is on 09/25/23. Would we be able to get him scheduled for a Lexiscan in time to be cleared? We were discussing this vs stress echo but the pt stated he can "try" to do the treadmill but didn't sound to confident so I figured we would go with the safer option of lexi.  Harlow Ohms, RN

## 2023-09-01 NOTE — Telephone Encounter (Signed)
 Pt made aware that this will sent to provider and nurse to address.  Aware it may be next week before someone can follow up on this.   Pt had a procedure on 4/7 and needs this prior to.  Informed that they will do there best to do a STAT appeal on this matter. He appreciates the update and agreeable to plan.

## 2023-09-01 NOTE — Telephone Encounter (Signed)
 Spoke with pt over the phone and explained that due to the PET/CT being denied by insurance, we could try a nuclear stress test or stress echo. Pt stated he could try to walk on the treadmill but didn't seem to have much confidence he would be able to complete the stress echo. Message sent to Thea Alken to determine wait time on getting pt in for Lexiscan to allow enough time for clearance to be determined for procedure on 4/7. Order for a Eugenie Birks has been placed and attestation has been pended and sent to Dr. Odis Hollingshead to sign.

## 2023-09-01 NOTE — Telephone Encounter (Signed)
 Pt called in about his PET scan being cancelled. Insurance denied. Please advise on next steps for pt.

## 2023-09-04 ENCOUNTER — Ambulatory Visit (INDEPENDENT_AMBULATORY_CARE_PROVIDER_SITE_OTHER): Payer: Medicare HMO

## 2023-09-04 ENCOUNTER — Encounter (HOSPITAL_COMMUNITY): Payer: Self-pay

## 2023-09-04 ENCOUNTER — Encounter (HOSPITAL_COMMUNITY): Payer: Self-pay | Admitting: Cardiology

## 2023-09-04 DIAGNOSIS — I639 Cerebral infarction, unspecified: Secondary | ICD-10-CM

## 2023-09-05 LAB — CUP PACEART REMOTE DEVICE CHECK
Date Time Interrogation Session: 20250316231335
Implantable Pulse Generator Implant Date: 20230911

## 2023-09-06 ENCOUNTER — Ambulatory Visit (HOSPITAL_COMMUNITY): Attending: Cardiology

## 2023-09-06 DIAGNOSIS — I700 Atherosclerosis of aorta: Secondary | ICD-10-CM | POA: Diagnosis present

## 2023-09-06 DIAGNOSIS — I2510 Atherosclerotic heart disease of native coronary artery without angina pectoris: Secondary | ICD-10-CM | POA: Diagnosis not present

## 2023-09-06 DIAGNOSIS — Z01810 Encounter for preprocedural cardiovascular examination: Secondary | ICD-10-CM | POA: Diagnosis not present

## 2023-09-06 LAB — MYOCARDIAL PERFUSION IMAGING
Base ST Depression (mm): 0 mm
LV dias vol: 177 mL (ref 62–150)
LV sys vol: 95 mL
Nuc Stress EF: 47 %
Peak HR: 65 {beats}/min
Rest HR: 53 {beats}/min
Rest Nuclear Isotope Dose: 8.2 mCi
SDS: 2
SRS: 1
SSS: 3
ST Depression (mm): 0 mm
Stress Nuclear Isotope Dose: 28.2 mCi
TID: 0.98

## 2023-09-06 MED ORDER — TECHNETIUM TC 99M TETROFOSMIN IV KIT
28.2000 | PACK | Freq: Once | INTRAVENOUS | Status: AC | PRN
  Administered 2023-09-06: 28.2 via INTRAVENOUS

## 2023-09-06 MED ORDER — TECHNETIUM TC 99M TETROFOSMIN IV KIT
8.2000 | PACK | Freq: Once | INTRAVENOUS | Status: AC | PRN
  Administered 2023-09-06: 8.2 via INTRAVENOUS

## 2023-09-06 MED ORDER — REGADENOSON 0.4 MG/5ML IV SOLN
0.4000 mg | Freq: Once | INTRAVENOUS | Status: AC
  Administered 2023-09-06: 0.4 mg via INTRAVENOUS

## 2023-09-07 ENCOUNTER — Ambulatory Visit

## 2023-09-07 NOTE — Addendum Note (Signed)
 Addended by: Geralyn Flash D on: 09/07/2023 02:41 PM   Modules accepted: Orders

## 2023-09-07 NOTE — Progress Notes (Signed)
 Carelink Summary Report / Loop Recorder

## 2023-09-11 ENCOUNTER — Ambulatory Visit (HOSPITAL_COMMUNITY): Attending: Cardiology

## 2023-09-11 DIAGNOSIS — I2510 Atherosclerotic heart disease of native coronary artery without angina pectoris: Secondary | ICD-10-CM

## 2023-09-11 DIAGNOSIS — E785 Hyperlipidemia, unspecified: Secondary | ICD-10-CM

## 2023-09-11 DIAGNOSIS — Z01810 Encounter for preprocedural cardiovascular examination: Secondary | ICD-10-CM

## 2023-09-11 LAB — ECHOCARDIOGRAM COMPLETE
Area-P 1/2: 2.97 cm2
P 1/2 time: 3103 ms
S' Lateral: 3.6 cm

## 2023-09-13 ENCOUNTER — Encounter: Payer: Self-pay | Admitting: Cardiology

## 2023-09-15 NOTE — Progress Notes (Addendum)
 COVID Vaccine received:  []  No [x]  Yes Date of any COVID positive Test in last 90 days: no PCP - Salli Real MD Cardiologist - Sunit Tolia DO Electrophys.Steffanie Dunn MD  Chest x-ray - 07/07/23 Epic EKG -08/21/23 Epic   Stress Test - 09/06/23 Epic ECHO - 09/11/23 Epic Cardiac Cath -   Bowel Prep - [x]  No  []   Yes ______  Pacemaker / ICD device [x]  No []  Yes   Spinal Cord Stimulator:[x]  No []  Yes       History of Sleep Apnea? [x]  No []  Yes   CPAP used?- [x]  No []  Yes    Does the patient monitor blood sugar?          [x]  No []  Yes  []  N/A  Patient has: []  NO Hx DM   []  Pre-DM                 []  DM1  []   DM2 Does patient have a [employer] or Dexacom? []  No []  Yes   Fasting Blood Sugar Ranges-  Checks Blood Sugar _____ times a day  GLP1 agonist / usual dose - no GLP1 instructions:  SGLT-2 inhibitors / usual dose - no SGLT-2 instructions:   Blood Thinner / Instructions: Aspirin Instructions:Plavix- Stop 5 days. Last dose April 2 per MD office  Comments:   Activity level: Patient is able to climb a flight of stairs without difficulty; [x]  No CP  [x]  No SOB,   Patient can /  perform ADLs without assistance.   Anesthesia review: HTN, CKD,Aortic atherosclerosis, stroke, loop recorder, Elevated s. Creat.  Patient denies shortness of breath, fever, cough and chest pain at PAT appointment.  Patient verbalized understanding and agreement to the Pre-Surgical Instructions that were given to them at this PAT appointment. Patient was also educated of the need to review these PAT instructions again prior to his/her surgery.I reviewed the appropriate phone numbers to call if they have any and questions or concerns.

## 2023-09-15 NOTE — Patient Instructions (Signed)
 SURGICAL WAITING ROOM VISITATION  Patients having surgery or a procedure may have no more than 2 support people in the waiting area - these visitors may rotate.    Children under the age of 74 must have an adult with them who is not the patient.  Due to an increase in RSV and influenza rates and associated hospitalizations, children ages 70 and under may not visit patients in [facility] hospitals.  Visitors with respiratory illnesses are discouraged from visiting and should remain at home.  If the patient needs to stay at the hospital during part of their recovery, the visitor guidelines for inpatient rooms apply. Pre-op nurse will coordinate an appropriate time for 1 support person to accompany patient in pre-op.  This support person may not rotate.    Please refer to the [facility] website for the visitor guidelines for Inpatients (after your surgery is over and you are in a regular room).       Your procedure is scheduled on: 09/25/23   Report to [facility] Main Entrance    Report to admitting at 9:15 AM   Call this number if you have problems the morning of surgery [phone]   Do not eat food :After Midnight.   After Midnight you may have the following liquids until 8:45 AM DAY OF SURGERY  Water Non-Citrus Juices (without pulp, NO RED-Apple, White grape, White cranberry) Black Coffee (NO MILK/CREAM OR CREAMERS, sugar ok)  Clear Tea (NO MILK/CREAM OR CREAMERS, sugar ok) regular and decaf                             Plain Jell-O (NO RED)                                           Fruit ices (not with fruit pulp, NO RED)                                     Popsicles (NO RED)                                                               Sports drinks like Gatorade (NO RED)                  The day of surgery:  Drink ONE (1) Pre-Surgery Clear Ensure at 8:45 AM the morning of surgery. Drink in one sitting. Do not sip.  This drink was given to you during your  hospital  pre-op appointment visit. Nothing else to drink after completing the  Pre-Surgery Clear Ensure .    Oral Hygiene is also important to reduce your risk of infection.                                    Remember - BRUSH YOUR TEETH THE MORNING OF SURGERY WITH YOUR REGULAR TOOTHPASTE  DENTURES WILL BE REMOVED PRIOR TO SURGERY PLEASE DO NOT APPLY "Poly grip" OR ADHESIVES!!!   Do NOT  smoke after Midnight   Stop all vitamins and herbal supplements 7 days before surgery.   Take these medicines the morning of surgery with WATER: Amlodipine, Atorvastatin, Metoprolol.             You may not have any metal on your body including hair pins, jewelry, and body piercing             Do not wear make-up, lotions, powders, perfumes/cologne, or deodorant              Men may shave face and neck.   Do not bring valuables to the hospital. [facility] IS NOT             RESPONSIBLE   FOR VALUABLES.   Contacts, glasses, dentures or bridgework may not be worn into surgery.   Bring small overnight bag day of surgery.   DO NOT BRING YOUR HOME MEDICATIONS TO THE HOSPITAL. PHARMACY WILL DISPENSE MEDICATIONS LISTED ON YOUR MEDICATION LIST TO YOU DURING YOUR ADMISSION IN THE HOSPITAL!    Patients discharged on the day of surgery will not be allowed to drive home.  Someone NEEDS to stay with you for the first 24 hours after anesthesia.   Special Instructions: Bring a copy of your healthcare power of attorney and living will documents the day of surgery if you haven't scanned them before.              Please read over the following fact sheets you were given: IF YOU HAVE QUESTIONS ABOUT YOUR PRE-OP INSTRUCTIONS PLEASE CALL [phone] [facility]   If you received a COVID test during your pre-op visit  it is requested that you wear a mask when out in public, stay away from anyone that may not be feeling well and notify your surgeon if you develop symptoms. If you test positive for Covid or have been in  contact with anyone that has tested positive in the last 10 days please notify you surgeon.      Pre-operative 5 CHG Bath Instructions   You can play a key role in reducing the risk of infection after surgery. Your skin needs to be as free of germs as possible. You can reduce the number of germs on your skin by washing with CHG (chlorhexidine gluconate) soap before surgery. CHG is an antiseptic soap that kills germs and continues to kill germs even after washing.   DO NOT use if you have an allergy to chlorhexidine/CHG or antibacterial soaps. If your skin becomes reddened or irritated, stop using the CHG and notify one of our RNs at [phone].   Please shower with the CHG soap starting 4 days before surgery using the following schedule:     Please keep in mind the following:  DO NOT shave, including legs and underarms, starting the day of your first shower.   You may shave your face at any point before/day of surgery.  Place clean sheets on your bed the day you start using CHG soap. Use a clean washcloth (not used since being washed) for each shower. DO NOT sleep with pets once you start using the CHG.   CHG Shower Instructions:  If you choose to wash your hair and private area, wash first with your normal shampoo/soap.  After you use shampoo/soap, rinse your hair and body thoroughly to remove shampoo/soap residue.  Turn the water OFF and apply about 3 tablespoons (45 ml) of CHG soap to a CLEAN washcloth.  Apply CHG soap ONLY FROM  YOUR NECK DOWN TO YOUR TOES (washing for 3-5 minutes)  DO NOT use CHG soap on face, private areas, open wounds, or sores.  Pay special attention to the area where your surgery is being performed.  If you are having back surgery, having someone wash your back for you may be helpful. Wait 2 minutes after CHG soap is applied, then you may rinse off the CHG soap.  Pat dry with a clean towel  Put on clean clothes/pajamas   If you choose to wear lotion, please  use ONLY the CHG-compatible lotions on the back of this paper.     Additional instructions for the day of surgery: DO NOT APPLY any lotions, deodorants, cologne, or perfumes.   Put on clean/comfortable clothes.  Brush your teeth.  Ask your nurse before applying any prescription medications to the skin.      CHG Compatible Lotions   Aveeno Moisturizing lotion  Cetaphil Moisturizing Cream  Cetaphil Moisturizing Lotion  Clairol Herbal Essence Moisturizing Lotion, Dry Skin  Clairol Herbal Essence Moisturizing Lotion, Extra Dry Skin  Clairol Herbal Essence Moisturizing Lotion, Normal Skin  Curel Age Defying Therapeutic Moisturizing Lotion with Alpha Hydroxy  Curel Extreme Care Body Lotion  Curel Soothing Hands Moisturizing Hand Lotion  Curel Therapeutic Moisturizing Cream, Fragrance-Free  Curel Therapeutic Moisturizing Lotion, Fragrance-Free  Curel Therapeutic Moisturizing Lotion, Original Formula  Eucerin Daily Replenishing Lotion  Eucerin Dry Skin Therapy Plus Alpha Hydroxy Crme  Eucerin Dry Skin Therapy Plus Alpha Hydroxy Lotion  Eucerin Original Crme  Eucerin Original Lotion  Eucerin Plus Crme Eucerin Plus Lotion  Eucerin TriLipid Replenishing Lotion  Keri Anti-Bacterial Hand Lotion  Keri Deep Conditioning Original Lotion Dry Skin Formula Softly Scented  Keri Deep Conditioning Original Lotion, Fragrance Free Sensitive Skin Formula  Keri Lotion Fast Absorbing Fragrance Free Sensitive Skin Formula  Keri Lotion Fast Absorbing Softly Scented Dry Skin Formula  Keri Original Lotion  Keri Skin Renewal Lotion Keri Silky Smooth Lotion  Keri Silky Smooth Sensitive Skin Lotion  Nivea Body Creamy Conditioning Oil  Nivea Body Extra Enriched Lotion  Nivea Body Original Lotion  Nivea Body Sheer Moisturizing Lotion Nivea Crme  Nivea Skin Firming Lotion  NutraDerm 30 Skin Lotion  NutraDerm Skin Lotion  NutraDerm Therapeutic Skin Cream  NutraDerm Therapeutic Skin Lotion  ProShield  Protective Hand Cream   Incentive Spirometer  An incentive spirometer is a tool that can help keep your lungs clear and active. This tool measures how well you are filling your lungs with each breath. Taking long deep breaths may help reverse or decrease the chance of developing breathing (pulmonary) problems (especially infection) following: A long period of time when you are unable to move or be active. BEFORE THE PROCEDURE  If the spirometer includes an indicator to show your best effort, your nurse or respiratory therapist will set it to a desired goal. If possible, sit up straight or lean slightly forward. Try not to slouch. Hold the incentive spirometer in an upright position. INSTRUCTIONS FOR USE  Sit on the edge of your bed if possible, or sit up as far as you can in bed or on a chair. Hold the incentive spirometer in an upright position. Breathe out normally. Place the mouthpiece in your mouth and seal your lips tightly around it. Breathe in slowly and as deeply as possible, raising the piston or the ball toward the top of the column. Hold your breath for 3-5 seconds or for as long as possible. Allow the piston or ball  to fall to the bottom of the column. Remove the mouthpiece from your mouth and breathe out normally. Rest for a few seconds and repeat Steps 1 through 7 at least 10 times every 1-2 hours when you are awake. Take your time and take a few normal breaths between deep breaths. The spirometer may include an indicator to show your best effort. Use the indicator as a goal to work toward during each repetition. After each set of 10 deep breaths, practice coughing to be sure your lungs are clear. If you have an incision (the cut made at the time of surgery), support your incision when coughing by placing a pillow or rolled up towels firmly against it. Once you are able to get out of bed, walk around indoors and cough well. You may stop using the incentive spirometer when  instructed by your caregiver.  RISKS AND COMPLICATIONS Take your time so you do not get dizzy or light-headed. If you are in pain, you may need to take or ask for pain medication before doing incentive spirometry. It is harder to take a deep breath if you are having pain. AFTER USE Rest and breathe slowly and easily. It can be helpful to keep track of a log of your progress. Your caregiver can provide you with a simple table to help with this. If you are using the spirometer at home, follow these instructions: SEEK MEDICAL CARE IF:  You are having difficultly using the spirometer. You have trouble using the spirometer as often as instructed. Your pain medication is not giving enough relief while using the spirometer. You develop fever of 100.5 F (38.1 C) or higher. SEEK IMMEDIATE MEDICAL CARE IF:  You cough up bloody sputum that had not been present before. You develop fever of 102 F (38.9 C) or greater. You develop worsening pain at or near the incision site. MAKE SURE YOU:  Understand these instructions. Will watch your condition. Will get help right away if you are not doing well or get worse.   Re .

## 2023-09-18 ENCOUNTER — Encounter (HOSPITAL_COMMUNITY)
Admission: RE | Admit: 2023-09-18 | Discharge: 2023-09-18 | Disposition: A | Discharge status: Home / Self Care | Source: Ambulatory Visit | Attending: Orthopedic Surgery | Admitting: Orthopedic Surgery

## 2023-09-18 ENCOUNTER — Encounter (HOSPITAL_COMMUNITY): Payer: Self-pay

## 2023-09-18 ENCOUNTER — Other Ambulatory Visit: Payer: Self-pay

## 2023-09-18 VITALS — BP 126/71 | HR 50 | Temp 98.0°F | Resp 16 | Ht 70.0 in | Wt 169.0 lb

## 2023-09-18 DIAGNOSIS — Z7902 Long term (current) use of antithrombotics/antiplatelets: Secondary | ICD-10-CM | POA: Diagnosis not present

## 2023-09-18 DIAGNOSIS — N189 Chronic kidney disease, unspecified: Secondary | ICD-10-CM | POA: Insufficient documentation

## 2023-09-18 DIAGNOSIS — I129 Hypertensive chronic kidney disease with stage 1 through stage 4 chronic kidney disease, or unspecified chronic kidney disease: Secondary | ICD-10-CM | POA: Insufficient documentation

## 2023-09-18 DIAGNOSIS — Z01812 Encounter for preprocedural laboratory examination: Secondary | ICD-10-CM | POA: Insufficient documentation

## 2023-09-18 DIAGNOSIS — Z87891 Personal history of nicotine dependence: Secondary | ICD-10-CM | POA: Diagnosis not present

## 2023-09-18 DIAGNOSIS — Z8673 Personal history of transient ischemic attack (TIA), and cerebral infarction without residual deficits: Secondary | ICD-10-CM | POA: Diagnosis not present

## 2023-09-18 DIAGNOSIS — I10 Essential (primary) hypertension: Secondary | ICD-10-CM

## 2023-09-18 DIAGNOSIS — Z01818 Encounter for other preprocedural examination: Secondary | ICD-10-CM

## 2023-09-18 DIAGNOSIS — M1711 Unilateral primary osteoarthritis, right knee: Secondary | ICD-10-CM | POA: Diagnosis not present

## 2023-09-18 HISTORY — DX: Unspecified osteoarthritis, unspecified site: M19.90

## 2023-09-18 LAB — SURGICAL PCR SCREEN
MRSA, PCR: NEGATIVE
Staphylococcus aureus: NEGATIVE

## 2023-09-20 NOTE — Progress Notes (Signed)
 Anesthesia Chart Review   Case: [id number] Date/Time: 09/25/23 1130   Procedure: ARTHROPLASTY, KNEE, TOTAL (Right: Knee)   Anesthesia type: Choice   Pre-op diagnosis: right knee osteoarthritis   Location: [facility] ROOM 09 / [facility]   Surgeons: Ollen Gross, MD       DISCUSSION:68 y.o. former smoker with h/o HTN, stroke, right knee OA scheduled for above procedure 09/25/2023 with Dr. Ollen Gross.   Pt seen by cardiology 08/21/2023 for preoperative evaluation.  Per OV note, "Preoperative Evaluation for Right Knee Replacement No current cardiac symptoms. History of stroke and carotid artery stenosis. Hypertension not well controlled. High cholesterol with elevated triglycerides in 2021. Limited physical activity due to knee pain. Surgery scheduled for 09/25/2023. -Order cardiac PET/CT to assess cardiac risk prior to surgery. -Echo will be ordered to evaluate for structural heart disease and left ventricular systolic function. -Add Amlodipine 10mg  daily for blood pressure control until primary care appointment. -Advise patient to monitor blood pressure at home and discuss with primary care provider."  Per note from Dr. Odis Hollingshead, "Results of the echo and stress test reviewed with the patient over the phone. No chest pain. From a cardiac standpoint intermediate risk for upcoming noncardiac surgery.  Patient finds his risk acceptable. Recommend that he still gets medical clearance from his primary care doctor given his comorbid conditions. He is on Plavix from a noncardiac standpoint, likely secondary to his stroke.  From a cardiovascular standpoint he can hold it prior to the surgery and restart when it is safe.  However final recommendations deferred to PCP."  Pt advised by PCP to hold Plavix 5 days prior to procedure, reports last dose 09/20/23.  VS: BP 126/71   Pulse (!) 50   Temp 36.7 C (Oral)   Resp 16   Ht 5\' 10"  (1.778 m)   Wt 76.7 kg   SpO2 100%   BMI 24.25 kg/m   PROVIDERS: Salli Real, MD  is PCP   Cardiologist - Sunit Tolia DO  Electrophys.Steffanie Dunn MD LABS: Labs reviewed: Acceptable for surgery. (all labs ordered are listed, but only abnormal results are displayed)  Labs Reviewed  SURGICAL PCR SCREEN     IMAGES:   EKG:   CV: Echo 09/11/2023 1. Left ventricular ejection fraction, by estimation, is 50 to 55%. The  left ventricle has low normal function. The left ventricle demonstrates  regional wall motion abnormalities (see scoring diagram/findings for  description). There is mild left  ventricular hypertrophy. Left ventricular diastolic parameters are  indeterminate. There is mild hypokinesis of the left ventricular,  basal-mid inferior wall.   2. Right ventricular systolic function is normal. The right ventricular  size is normal.   3. The mitral valve is normal in structure. Mild mitral valve  regurgitation.   4. Eccentric jets/splay of aortic regurgitation. The aortic valve is  tricuspid. There is mild calcification of the aortic valve. There is mild  thickening of the aortic valve. Aortic valve regurgitation is mild to  moderate. Aortic valve  sclerosis/calcification is present, without any evidence of aortic  stenosis.   5. The inferior vena cava is normal in size with greater than 50%  respiratory variability, suggesting right atrial pressure of 3 mmHg.    Myocardial Perfusion 09/06/2023   A pharmacological stress test was performed using IV Lexiscan 0.4mg  over 10 seconds performed without concurrent submaximal exercise. Normal blood pressure and normal heart rate response noted during stress. Heart rate recovery was normal.   No ST deviation was  noted. Arrhythmias during stress: occasional PVCs. Arrhythmias during recovery: occasional PVCs. ECG was interpretable and without significant changes. The ECG was not diagnostic due to pharmacologic protocol.   LV perfusion is abnormal. There is no evidence of ischemia. There is evidence of  infarction. Defect 1: There is a medium defect with moderate reduction in uptake present in the mid to basal inferior and inferolateral location(s) that is fixed. There is abnormal wall motion in the defect area with hypokinesis of the mid inferolateral wall. Consistent with infarction. Defect 2: There is a small defect with moderate reduction in uptake present in the apical location(s) that is fixed. There is normal wall motion in the defect area. Consistent with artifact caused by diaphragmatic attenuation.   Left ventricular function is abnormal. Nuclear stress EF: 47%. The left ventricular ejection fraction is mildly decreased (45-54%). End diastolic cavity size is severely enlarged. End systolic cavity size is moderately enlarged. No evidence of transient ischemic dilation (TID) noted.   Prior study not available for comparison.   Findings are consistent with infarction. The study is intermediate risk. 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   Jodell Cipro Ward, PA-C [facility] Pre-Surgical Testing [phone]

## 2023-09-24 ENCOUNTER — Encounter: Payer: Self-pay | Admitting: Cardiology

## 2023-09-25 ENCOUNTER — Other Ambulatory Visit: Payer: Self-pay

## 2023-09-25 ENCOUNTER — Encounter (HOSPITAL_COMMUNITY)
Admission: RE | Disposition: A | Discharge status: Home / Self Care | Payer: Self-pay | Source: Home / Self Care | Attending: Orthopedic Surgery

## 2023-09-25 ENCOUNTER — Ambulatory Visit (HOSPITAL_COMMUNITY): Admitting: Anesthesiology

## 2023-09-25 ENCOUNTER — Observation Stay (HOSPITAL_COMMUNITY)
Admission: RE | Admit: 2023-09-25 | Discharge: 2023-09-26 | Disposition: A | Discharge status: Home / Self Care | Payer: Medicare HMO | Attending: Orthopedic Surgery | Admitting: Orthopedic Surgery

## 2023-09-25 ENCOUNTER — Encounter (HOSPITAL_COMMUNITY): Payer: Self-pay | Admitting: Orthopedic Surgery

## 2023-09-25 ENCOUNTER — Ambulatory Visit (HOSPITAL_COMMUNITY): Payer: Self-pay | Admitting: Physician Assistant

## 2023-09-25 DIAGNOSIS — F17210 Nicotine dependence, cigarettes, uncomplicated: Secondary | ICD-10-CM | POA: Diagnosis not present

## 2023-09-25 DIAGNOSIS — N183 Chronic kidney disease, stage 3 unspecified: Secondary | ICD-10-CM | POA: Diagnosis not present

## 2023-09-25 DIAGNOSIS — I639 Cerebral infarction, unspecified: Secondary | ICD-10-CM | POA: Diagnosis not present

## 2023-09-25 DIAGNOSIS — Z79899 Other long term (current) drug therapy: Secondary | ICD-10-CM | POA: Diagnosis not present

## 2023-09-25 DIAGNOSIS — I10 Essential (primary) hypertension: Secondary | ICD-10-CM | POA: Diagnosis not present

## 2023-09-25 DIAGNOSIS — M179 Osteoarthritis of knee, unspecified: Principal | ICD-10-CM | POA: Diagnosis present

## 2023-09-25 DIAGNOSIS — I2510 Atherosclerotic heart disease of native coronary artery without angina pectoris: Secondary | ICD-10-CM

## 2023-09-25 DIAGNOSIS — Z8673 Personal history of transient ischemic attack (TIA), and cerebral infarction without residual deficits: Secondary | ICD-10-CM | POA: Insufficient documentation

## 2023-09-25 DIAGNOSIS — I129 Hypertensive chronic kidney disease with stage 1 through stage 4 chronic kidney disease, or unspecified chronic kidney disease: Secondary | ICD-10-CM | POA: Diagnosis not present

## 2023-09-25 DIAGNOSIS — M1711 Unilateral primary osteoarthritis, right knee: Secondary | ICD-10-CM | POA: Diagnosis not present

## 2023-09-25 DIAGNOSIS — E1122 Type 2 diabetes mellitus with diabetic chronic kidney disease: Secondary | ICD-10-CM | POA: Diagnosis not present

## 2023-09-25 DIAGNOSIS — Z7902 Long term (current) use of antithrombotics/antiplatelets: Secondary | ICD-10-CM | POA: Diagnosis not present

## 2023-09-25 HISTORY — PX: TOTAL KNEE ARTHROPLASTY: SHX125

## 2023-09-25 SURGERY — ARTHROPLASTY, KNEE, TOTAL
Anesthesia: Spinal | Site: Knee | Laterality: Right

## 2023-09-25 MED ORDER — FENTANYL CITRATE PF 50 MCG/ML IJ SOSY: 25.0000 ug | PREFILLED_SYRINGE | INTRAMUSCULAR | Status: DC | PRN

## 2023-09-25 MED ORDER — PROPOFOL 10 MG/ML IV BOLUS
INTRAVENOUS | Status: DC | PRN
  Administered 2023-09-25 (×2): 20 mg via INTRAVENOUS

## 2023-09-25 MED ORDER — ROPIVACAINE HCL 5 MG/ML IJ SOLN
INTRAMUSCULAR | Status: DC | PRN
  Administered 2023-09-25: 20 mL via PERINEURAL

## 2023-09-25 MED ORDER — ACETAMINOPHEN 500 MG PO TABS
1000.0000 mg | ORAL_TABLET | Freq: Four times a day (QID) | ORAL | Status: AC
  Administered 2023-09-25 – 2023-09-26 (×4): 1000 mg via ORAL
  Filled 2023-09-25 (×4): qty 2

## 2023-09-25 MED ORDER — ASPIRIN 325 MG PO TBEC
325.0000 mg | DELAYED_RELEASE_TABLET | Freq: Every day | ORAL | Status: DC
  Administered 2023-09-26: 325 mg via ORAL
  Filled 2023-09-25: qty 1

## 2023-09-25 MED ORDER — MENTHOL 3 MG MT LOZG: 1.0000 | LOZENGE | OROMUCOSAL | Status: DC | PRN

## 2023-09-25 MED ORDER — ONDANSETRON HCL 4 MG/2ML IJ SOLN: 4.0000 mg | Freq: Once | INTRAMUSCULAR | Status: DC | PRN

## 2023-09-25 MED ORDER — SODIUM CHLORIDE (PF) 0.9 % IJ SOLN
INTRAMUSCULAR | Status: AC
  Filled 2023-09-25: qty 50

## 2023-09-25 MED ORDER — DOCUSATE SODIUM 100 MG PO CAPS
100.0000 mg | ORAL_CAPSULE | Freq: Two times a day (BID) | ORAL | Status: DC
  Administered 2023-09-25 – 2023-09-26 (×2): 100 mg via ORAL
  Filled 2023-09-25 (×2): qty 1

## 2023-09-25 MED ORDER — ONDANSETRON HCL 4 MG/2ML IJ SOLN
INTRAMUSCULAR | Status: AC
  Filled 2023-09-25: qty 2

## 2023-09-25 MED ORDER — METHOCARBAMOL 1000 MG/10ML IJ SOLN: 500.0000 mg | Freq: Four times a day (QID) | INTRAMUSCULAR | Status: DC | PRN

## 2023-09-25 MED ORDER — SODIUM CHLORIDE 0.9 % IR SOLN
Status: DC | PRN
  Administered 2023-09-25: 1000 mL

## 2023-09-25 MED ORDER — PROPOFOL 10 MG/ML IV BOLUS
INTRAVENOUS | Status: AC
  Filled 2023-09-25: qty 20

## 2023-09-25 MED ORDER — DIPHENHYDRAMINE HCL 12.5 MG/5ML PO ELIX: 12.5000 mg | ORAL_SOLUTION | ORAL | Status: DC | PRN

## 2023-09-25 MED ORDER — LIDOCAINE HCL (PF) 2 % IJ SOLN
INTRAMUSCULAR | Status: AC
  Filled 2023-09-25: qty 5

## 2023-09-25 MED ORDER — BUPIVACAINE LIPOSOME 1.3 % IJ SUSP
INTRAMUSCULAR | Status: AC
  Filled 2023-09-25: qty 20

## 2023-09-25 MED ORDER — CHLORHEXIDINE GLUCONATE 0.12 % MT SOLN
15.0000 mL | Freq: Once | OROMUCOSAL | Status: AC
  Administered 2023-09-25: 15 mL via OROMUCOSAL

## 2023-09-25 MED ORDER — METOPROLOL SUCCINATE ER 25 MG PO TB24
25.0000 mg | ORAL_TABLET | Freq: Every day | ORAL | Status: DC
  Administered 2023-09-26: 25 mg via ORAL
  Filled 2023-09-25: qty 1

## 2023-09-25 MED ORDER — HYDROMORPHONE HCL 1 MG/ML IJ SOLN: 0.5000 mg | INTRAMUSCULAR | Status: DC | PRN

## 2023-09-25 MED ORDER — DEXAMETHASONE SODIUM PHOSPHATE 10 MG/ML IJ SOLN
8.0000 mg | Freq: Once | INTRAMUSCULAR | Status: AC
Start: 2023-09-25 — End: 2023-09-25
  Administered 2023-09-25: 8 mg via INTRAVENOUS

## 2023-09-25 MED ORDER — OXYCODONE HCL 5 MG/5ML PO SOLN: 5.0000 mg | Freq: Once | ORAL | Status: DC | PRN

## 2023-09-25 MED ORDER — TRANEXAMIC ACID-NACL 1000-0.7 MG/100ML-% IV SOLN
1000.0000 mg | INTRAVENOUS | Status: AC
Start: 2023-09-25 — End: 2023-09-25
  Administered 2023-09-25: 1000 mg via INTRAVENOUS
  Filled 2023-09-25: qty 100

## 2023-09-25 MED ORDER — ACETAMINOPHEN 10 MG/ML IV SOLN
1000.0000 mg | Freq: Four times a day (QID) | INTRAVENOUS | Status: DC
Start: 2023-09-25 — End: 2023-09-25
  Administered 2023-09-25: 1000 mg via INTRAVENOUS
  Filled 2023-09-25: qty 100

## 2023-09-25 MED ORDER — SODIUM CHLORIDE 0.9 % IV SOLN
INTRAVENOUS | Status: DC | PRN
  Administered 2023-09-25: 80 mL

## 2023-09-25 MED ORDER — PROPOFOL 500 MG/50ML IV EMUL
INTRAVENOUS | Status: DC | PRN
  Administered 2023-09-25: 75 ug/kg/min via INTRAVENOUS

## 2023-09-25 MED ORDER — GABAPENTIN 300 MG PO CAPS
300.0000 mg | ORAL_CAPSULE | Freq: Three times a day (TID) | ORAL | Status: DC
  Administered 2023-09-25 – 2023-09-26 (×3): 300 mg via ORAL
  Filled 2023-09-25 (×3): qty 1

## 2023-09-25 MED ORDER — 0.9 % SODIUM CHLORIDE (POUR BTL) OPTIME
TOPICAL | Status: DC | PRN
  Administered 2023-09-25: 1000 mL

## 2023-09-25 MED ORDER — EPHEDRINE SULFATE (PRESSORS) 50 MG/ML IJ SOLN
INTRAMUSCULAR | Status: DC | PRN
  Administered 2023-09-25: 10 mg via INTRAVENOUS

## 2023-09-25 MED ORDER — MIDAZOLAM HCL 2 MG/2ML IJ SOLN
1.0000 mg | INTRAMUSCULAR | Status: AC
  Administered 2023-09-25: 1 mg via INTRAVENOUS
  Filled 2023-09-25: qty 2

## 2023-09-25 MED ORDER — HYDROCHLOROTHIAZIDE 12.5 MG PO TABS
12.5000 mg | ORAL_TABLET | Freq: Every day | ORAL | Status: DC
  Administered 2023-09-26: 12.5 mg via ORAL
  Filled 2023-09-25: qty 1

## 2023-09-25 MED ORDER — METHOCARBAMOL 500 MG PO TABS
500.0000 mg | ORAL_TABLET | Freq: Four times a day (QID) | ORAL | Status: DC | PRN
  Administered 2023-09-25 – 2023-09-26 (×2): 500 mg via ORAL
  Filled 2023-09-25 (×2): qty 1

## 2023-09-25 MED ORDER — PROPOFOL 1000 MG/100ML IV EMUL
INTRAVENOUS | Status: AC
  Filled 2023-09-25: qty 100

## 2023-09-25 MED ORDER — ONDANSETRON HCL 4 MG/2ML IJ SOLN: 4.0000 mg | Freq: Four times a day (QID) | INTRAMUSCULAR | Status: DC | PRN

## 2023-09-25 MED ORDER — ONDANSETRON HCL 4 MG/2ML IJ SOLN
INTRAMUSCULAR | Status: DC | PRN
  Administered 2023-09-25: 4 mg via INTRAVENOUS

## 2023-09-25 MED ORDER — LACTATED RINGERS IV SOLN: INTRAVENOUS | Status: DC

## 2023-09-25 MED ORDER — METOCLOPRAMIDE HCL 5 MG PO TABS: 5.0000 mg | ORAL_TABLET | Freq: Three times a day (TID) | ORAL | Status: DC | PRN

## 2023-09-25 MED ORDER — CLOPIDOGREL BISULFATE 75 MG PO TABS
75.0000 mg | ORAL_TABLET | Freq: Every day | ORAL | Status: DC
  Administered 2023-09-26: 75 mg via ORAL
  Filled 2023-09-25: qty 1

## 2023-09-25 MED ORDER — DEXAMETHASONE SODIUM PHOSPHATE 10 MG/ML IJ SOLN
10.0000 mg | Freq: Once | INTRAMUSCULAR | Status: AC
  Administered 2023-09-26: 10 mg via INTRAVENOUS
  Filled 2023-09-25: qty 1

## 2023-09-25 MED ORDER — LIDOCAINE HCL (CARDIAC) PF 100 MG/5ML IV SOSY
PREFILLED_SYRINGE | INTRAVENOUS | Status: DC | PRN
  Administered 2023-09-25: 40 mg via INTRAVENOUS

## 2023-09-25 MED ORDER — ATORVASTATIN CALCIUM 40 MG PO TABS
80.0000 mg | ORAL_TABLET | Freq: Every day | ORAL | Status: DC
  Administered 2023-09-26: 80 mg via ORAL
  Filled 2023-09-25: qty 2

## 2023-09-25 MED ORDER — PHENYLEPHRINE HCL-NACL 20-0.9 MG/250ML-% IV SOLN
INTRAVENOUS | Status: DC | PRN
  Administered 2023-09-25: 25 ug/min via INTRAVENOUS

## 2023-09-25 MED ORDER — CEFAZOLIN SODIUM-DEXTROSE 2-4 GM/100ML-% IV SOLN
2.0000 g | INTRAVENOUS | Status: AC
Start: 2023-09-25 — End: 2023-09-25
  Administered 2023-09-25: 2 g via INTRAVENOUS
  Filled 2023-09-25: qty 100

## 2023-09-25 MED ORDER — ONDANSETRON HCL 4 MG PO TABS: 4.0000 mg | ORAL_TABLET | Freq: Four times a day (QID) | ORAL | Status: DC | PRN

## 2023-09-25 MED ORDER — TRAMADOL HCL 50 MG PO TABS
50.0000 mg | ORAL_TABLET | Freq: Four times a day (QID) | ORAL | Status: DC | PRN
  Administered 2023-09-26: 50 mg via ORAL
  Filled 2023-09-25: qty 1

## 2023-09-25 MED ORDER — BUPIVACAINE LIPOSOME 1.3 % IJ SUSP: 20.0000 mL | Freq: Once | INTRAMUSCULAR | Status: DC

## 2023-09-25 MED ORDER — DEXAMETHASONE SODIUM PHOSPHATE 10 MG/ML IJ SOLN
INTRAMUSCULAR | Status: DC | PRN
  Administered 2023-09-25: 10 mg

## 2023-09-25 MED ORDER — BUPIVACAINE IN DEXTROSE 0.75-8.25 % IT SOLN
INTRATHECAL | Status: DC | PRN
  Administered 2023-09-25: 1.8 mL via INTRATHECAL

## 2023-09-25 MED ORDER — OXYCODONE HCL 5 MG PO TABS
5.0000 mg | ORAL_TABLET | ORAL | Status: DC | PRN
  Administered 2023-09-25 (×2): 5 mg via ORAL
  Administered 2023-09-26: 10 mg via ORAL
  Administered 2023-09-26: 5 mg via ORAL
  Filled 2023-09-25: qty 2
  Filled 2023-09-25 (×3): qty 1

## 2023-09-25 MED ORDER — BISACODYL 10 MG RE SUPP: 10.0000 mg | Freq: Every day | RECTAL | Status: DC | PRN

## 2023-09-25 MED ORDER — CLONIDINE HCL (ANALGESIA) 100 MCG/ML EP SOLN
EPIDURAL | Status: DC | PRN
  Administered 2023-09-25: 100 ug

## 2023-09-25 MED ORDER — ACETAMINOPHEN 325 MG PO TABS: 325.0000 mg | ORAL_TABLET | Freq: Four times a day (QID) | ORAL | Status: DC | PRN

## 2023-09-25 MED ORDER — ORAL CARE MOUTH RINSE: 15.0000 mL | Freq: Once | OROMUCOSAL | Status: AC

## 2023-09-25 MED ORDER — OXYCODONE HCL 5 MG PO TABS: 5.0000 mg | ORAL_TABLET | Freq: Once | ORAL | Status: DC | PRN

## 2023-09-25 MED ORDER — CEFAZOLIN SODIUM-DEXTROSE 2-4 GM/100ML-% IV SOLN
2.0000 g | Freq: Four times a day (QID) | INTRAVENOUS | Status: AC
  Administered 2023-09-25 – 2023-09-26 (×2): 2 g via INTRAVENOUS
  Filled 2023-09-25 (×2): qty 100

## 2023-09-25 MED ORDER — POLYETHYLENE GLYCOL 3350 17 G PO PACK: 17.0000 g | PACK | Freq: Every day | ORAL | Status: DC | PRN

## 2023-09-25 MED ORDER — ACETAMINOPHEN 10 MG/ML IV SOLN: 1000.0000 mg | Freq: Once | INTRAVENOUS | Status: DC | PRN

## 2023-09-25 MED ORDER — FENTANYL CITRATE PF 50 MCG/ML IJ SOSY
50.0000 ug | PREFILLED_SYRINGE | INTRAMUSCULAR | Status: AC
  Administered 2023-09-25: 50 ug via INTRAVENOUS
  Filled 2023-09-25: qty 2

## 2023-09-25 MED ORDER — AMLODIPINE BESYLATE 10 MG PO TABS
10.0000 mg | ORAL_TABLET | Freq: Every day | ORAL | Status: DC
  Administered 2023-09-26: 10 mg via ORAL
  Filled 2023-09-25: qty 1

## 2023-09-25 MED ORDER — METOCLOPRAMIDE HCL 5 MG/ML IJ SOLN: 5.0000 mg | Freq: Three times a day (TID) | INTRAMUSCULAR | Status: DC | PRN

## 2023-09-25 MED ORDER — FLEET ENEMA RE ENEM: 1.0000 | ENEMA | Freq: Once | RECTAL | Status: DC | PRN

## 2023-09-25 MED ORDER — PHENOL 1.4 % MT LIQD: 1.0000 | OROMUCOSAL | Status: DC | PRN

## 2023-09-25 MED ORDER — SODIUM CHLORIDE 0.9 % IV SOLN: INTRAVENOUS | Status: DC

## 2023-09-25 MED ORDER — SODIUM CHLORIDE (PF) 0.9 % IJ SOLN
INTRAMUSCULAR | Status: AC
  Filled 2023-09-25: qty 10

## 2023-09-25 MED ORDER — POVIDONE-IODINE 10 % EX SWAB: 2.0000 | Freq: Once | CUTANEOUS | Status: DC

## 2023-09-25 SURGICAL SUPPLY — 47 items
ATTUNE MED DOME PAT 41 KNEE (Knees) IMPLANT
ATTUNE PS FEM RT SZ 6 CEM KNEE (Femur) IMPLANT
ATTUNE PSRP INSR SZ6 10 KNEE (Insert) IMPLANT
BAG COUNTER SPONGE SURGICOUNT (BAG) IMPLANT
BAG ZIPLOCK 12X15 (MISCELLANEOUS) ×2 IMPLANT
BASE TIBIAL ROT PLAT SZ 7 KNEE (Knees) IMPLANT
BLADE SAG 18X100X1.27 (BLADE) ×2 IMPLANT
BLADE SAW SGTL 11.0X1.19X90.0M (BLADE) ×2 IMPLANT
BNDG ELASTIC 6INX 5YD STR LF (GAUZE/BANDAGES/DRESSINGS) ×2 IMPLANT
BOWL SMART MIX CTS (DISPOSABLE) ×2 IMPLANT
CEMENT HV SMART SET (Cement) ×4 IMPLANT
COVER SURGICAL LIGHT HANDLE (MISCELLANEOUS) ×2 IMPLANT
CUFF TRNQT CYL 34X4.125X (TOURNIQUET CUFF) ×2 IMPLANT
DERMABOND ADVANCED .7 DNX12 (GAUZE/BANDAGES/DRESSINGS) ×2 IMPLANT
DRAPE U-SHAPE 47X51 STRL (DRAPES) ×2 IMPLANT
DRSG AQUACEL AG ADV 3.5X10 (GAUZE/BANDAGES/DRESSINGS) ×2 IMPLANT
DURAPREP 26ML APPLICATOR (WOUND CARE) ×2 IMPLANT
ELECT PENCIL ROCKER SW 15FT (MISCELLANEOUS) ×2 IMPLANT
ELECT REM PT RETURN 15FT ADLT (MISCELLANEOUS) ×2 IMPLANT
GLOVE BIO SURGEON STRL SZ 6.5 (GLOVE) IMPLANT
GLOVE BIO SURGEON STRL SZ7 (GLOVE) IMPLANT
GLOVE BIO SURGEON STRL SZ8 (GLOVE) ×2 IMPLANT
GLOVE BIOGEL PI IND STRL 7.0 (GLOVE) IMPLANT
GLOVE BIOGEL PI IND STRL 8 (GLOVE) ×2 IMPLANT
GOWN STRL REUS W/ TWL LRG LVL3 (GOWN DISPOSABLE) ×2 IMPLANT
HOLDER FOLEY CATH W/STRAP (MISCELLANEOUS) IMPLANT
IMMOBILIZER KNEE 20 (SOFTGOODS) ×1 IMPLANT
IMMOBILIZER KNEE 20 THIGH 36 (SOFTGOODS) ×2 IMPLANT
KIT TURNOVER KIT A (KITS) IMPLANT
MANIFOLD NEPTUNE II (INSTRUMENTS) ×2 IMPLANT
NS IRRIG 1000ML POUR BTL (IV SOLUTION) ×2 IMPLANT
PACK TOTAL KNEE CUSTOM (KITS) ×2 IMPLANT
PADDING CAST COTTON 6X4 STRL (CAST SUPPLIES) ×4 IMPLANT
PIN STEINMAN FIXATION KNEE (PIN) IMPLANT
PROTECTOR NERVE ULNAR (MISCELLANEOUS) ×2 IMPLANT
SET HNDPC FAN SPRY TIP SCT (DISPOSABLE) ×2 IMPLANT
SUT MNCRL AB 4-0 PS2 18 (SUTURE) ×2 IMPLANT
SUT STRATAFIX 0 PDS 27 VIOLET (SUTURE) ×1 IMPLANT
SUT VIC AB 0 CT1 36 (SUTURE) ×2 IMPLANT
SUT VIC AB 2-0 CT1 TAPERPNT 27 (SUTURE) ×6 IMPLANT
SUTURE STRATFX 0 PDS 27 VIOLET (SUTURE) ×2 IMPLANT
TIBIAL BASE ROT PLAT SZ 7 KNEE (Knees) ×1 IMPLANT
TOWEL GREEN STERILE FF (TOWEL DISPOSABLE) ×2 IMPLANT
TRAY FOLEY MTR SLVR 16FR STAT (SET/KITS/TRAYS/PACK) IMPLANT
TUBE SUCTION HIGH CAP CLEAR NV (SUCTIONS) ×2 IMPLANT
WATER STERILE IRR 1000ML POUR (IV SOLUTION) ×4 IMPLANT
WRAP KNEE MAXI GEL POST OP (GAUZE/BANDAGES/DRESSINGS) ×2 IMPLANT

## 2023-09-25 NOTE — Anesthesia Procedure Notes (Signed)
 Spinal  Patient location during procedure: OR Start time: 09/25/2023 11:15 AM End time: 09/25/2023 11:17 AM Reason for block: surgical anesthesia Staffing Performed: anesthesiologist  Anesthesiologist: Mariann Barter, MD Performed by: Mariann Barter, MD Authorized by: Mariann Barter, MD   Preanesthetic Checklist Completed: patient identified, IV checked, site marked, risks and benefits discussed, surgical consent, monitors and equipment checked, pre-op evaluation and timeout performed Spinal Block Patient position: sitting Prep: DuraPrep Patient monitoring: heart rate, cardiac monitor, continuous pulse ox and blood pressure Approach: midline Location: L3-4 Injection technique: single-shot Needle Needle type: Sprotte  Needle gauge: 24 G Needle length: 9 cm Assessment Sensory level: T4 Events: CSF return

## 2023-09-25 NOTE — Anesthesia Postprocedure Evaluation (Signed)
 Anesthesia Post Note  Patient: TORAN MURCH  Procedure(s) Performed: ARTHROPLASTY, KNEE, TOTAL (Right: Knee)     Patient location during evaluation: PACU Anesthesia Type: Spinal Level of consciousness: awake and alert Pain management: pain level controlled Vital Signs Assessment: post-procedure vital signs reviewed and stable Respiratory status: spontaneous breathing, nonlabored ventilation, respiratory function stable and patient connected to nasal cannula oxygen Cardiovascular status: blood pressure returned to baseline and stable Postop Assessment: no apparent nausea or vomiting Anesthetic complications: no   No notable events documented.  Last Vitals:  Vitals:   09/25/23 1345 09/25/23 1400  BP: 134/79 122/64  Pulse: (!) 53 (!) 57  Resp: 10 14  Temp:    SpO2: 99% 100%    Last Pain:  Vitals:   09/25/23 1400  TempSrc:   PainSc: 0-No pain                 Mariann Barter

## 2023-09-25 NOTE — Evaluation (Signed)
 Physical Therapy Evaluation Patient Details Name: Caleb Cardenas MRN: [id number] DOB: 1955-07-11 Today's Date: 09/25/2023  History of Present Illness  68 yo male s/p R TKA 09/25/23. Hx of CVA, vertigo  Clinical Impression  On eval POD 0, pt was CGA for mobility. He walked ~50 feet with a RW. Moderate pain with activity. Will follow and progress activity as tolerated. Plan is for OP PT f/u.        If plan is discharge home, recommend the following: A little help with walking and/or transfers;A little help with bathing/dressing/bathroom;Assistance with cooking/housework;Assist for transportation;Help with stairs or ramp for entrance   Can travel by private vehicle        Equipment Recommendations Rolling walker (2 wheels)  Recommendations for Other Services       Functional Status Assessment Patient has had a recent decline in their functional status and demonstrates the ability to make significant improvements in function in a reasonable and predictable amount of time.     Precautions / Restrictions Precautions Precautions: Fall;Knee Restrictions Weight Bearing Restrictions Per Provider Order: No Other Position/Activity Restrictions: WBAT      Mobility  Bed Mobility Overal bed mobility: Needs Assistance Bed Mobility: Supine to Sit     Supine to sit: Supervision, HOB elevated     General bed mobility comments: Cues for safety.    Transfers Overall transfer level: Needs assistance Equipment used: Rolling walker (2 wheels) Transfers: Sit to/from Stand Sit to Stand: Contact guard assist           General transfer comment: Cues for safety, hand/LE placement.    Ambulation/Gait Ambulation/Gait assistance: Contact guard assist Gait Distance (Feet): 50 Feet Assistive device: Rolling walker (2 wheels) Gait Pattern/deviations: Step-to pattern       General Gait Details: Cues for safety, sequencing. Pt denied dizziness. Tolerated distance well.  Stairs             Wheelchair Mobility     Tilt Bed    Modified Rankin (Stroke Patients Only)       Balance Overall balance assessment: Needs assistance         Standing balance support: Bilateral upper extremity supported, During functional activity, Reliant on assistive device for balance Standing balance-Leahy Scale: Poor                               Pertinent Vitals/Pain Pain Assessment Pain Assessment: 0-10 Pain Score: 6  Pain Location: R knee Pain Intervention(s): Limited activity within patient's tolerance, Monitored during session, Ice applied    Home Living Family/patient expects to be discharged to:: Private residence Living Arrangements: Alone Available Help at Discharge: Family;Available PRN/intermittently Type of Home: Apartment Home Access: Level entry       Home Layout: One level Home Equipment: None      Prior Function Prior Level of Function : Independent/Modified Independent                     Extremity/Trunk Assessment   Upper Extremity Assessment Upper Extremity Assessment: Overall WFL for tasks assessed    Lower Extremity Assessment Lower Extremity Assessment: Generalized weakness    Cervical / Trunk Assessment Cervical / Trunk Assessment: Normal  Communication   Communication Communication: No apparent difficulties    Cognition Arousal: Alert Behavior During Therapy: WFL for tasks assessed/performed   PT - Cognitive impairments: No apparent impairments  Following commands: Intact       Cueing Cueing Techniques: Verbal cues     General Comments      Exercises     Assessment/Plan    PT Assessment Patient needs continued PT services  PT Problem List Decreased strength;Decreased range of motion;Decreased activity tolerance;Decreased balance;Decreased mobility;Decreased knowledge of use of DME;Pain       PT Treatment Interventions DME instruction;Gait training;Stair  training;Functional mobility training;Therapeutic activities;Therapeutic exercise;Patient/family education;Balance training    PT Goals (Current goals can be found in the Care Plan section)  Acute Rehab PT Goals Patient Stated Goal: regain plof/independence PT Goal Formulation: With patient Time For Goal Achievement: 10/09/23 Potential to Achieve Goals: Good    Frequency 7X/week     Co-evaluation               AM-PAC PT "6 Clicks" Mobility  Outcome Measure Help needed turning from your back to your side while in a flat bed without using bedrails?: A Little Help needed moving from lying on your back to sitting on the side of a flat bed without using bedrails?: A Little Help needed moving to and from a bed to a chair (including a wheelchair)?: A Little Help needed standing up from a chair using your arms (e.g., wheelchair or bedside chair)?: A Little Help needed to walk in hospital room?: A Little Help needed climbing 3-5 steps with a railing? : A Little 6 Click Score: 18    End of Session Equipment Utilized During Treatment: Gait belt Activity Tolerance: Patient tolerated treatment well Patient left: with family/visitor present;with call bell/phone within reach;in chair   PT Visit Diagnosis: Other abnormalities of gait and mobility (R26.89);Pain Pain - Right/Left: Right Pain - part of body: Knee    Time: 1625-1650 PT Time Calculation (min) (ACUTE ONLY): 25 min   Charges:   PT Evaluation $PT Eval Low Complexity: 1 Low PT Treatments $Gait Training: 8-22 mins PT General Charges $$ ACUTE PT VISIT: 1 Visit            Faye Ramsay, PT Acute Rehabilitation  Office: [phone]

## 2023-09-25 NOTE — Progress Notes (Signed)
 Orthopedic Tech Progress Note Patient Details:  Caleb Cardenas 15-Oct-1955 [id number]  CPM Right Knee CPM Right Knee: On Right Knee Flexion (Degrees): 40 Right Knee Extension (Degrees): 10  Post Interventions Patient Tolerated: Well  Darleen Crocker 09/25/2023, 1:00 PM

## 2023-09-25 NOTE — Discharge Instructions (Signed)
 Ollen Gross, MD Total Joint Specialist [facility] [place] [address], [address] [place], [place] [address] [phone]  TOTAL KNEE REPLACEMENT POSTOPERATIVE DIRECTIONS    Knee Rehabilitation, Guidelines Following Surgery  Results after knee surgery are often greatly improved when you follow the exercise, range of motion and muscle strengthening exercises prescribed by your doctor. Safety measures are also important to protect the knee from further injury. If any of these exercises cause you to have increased pain or swelling in your knee joint, decrease the amount until you are comfortable again and slowly increase them. If you have problems or questions, call your caregiver or physical therapist for advice.   BLOOD CLOT PREVENTION Take a 325 mg Aspirin once a day for three weeks following surgery with your usual Plavix dose. Then take an 81 mg Aspirin once a day for three weeks with your usual Plavix. Then discontinue Aspirin. You may resume your vitamins/supplements upon discharge from the hospital.   HOME CARE INSTRUCTIONS  Remove items at home which could result in a fall. This includes throw rugs or furniture in walking pathways.  ICE to the affected knee as much as tolerated. Icing helps control swelling. If the swelling is well controlled you will be more comfortable and rehab easier. Continue to use ice on the knee for pain and swelling from surgery. You may notice swelling that will progress down to the foot and ankle. This is normal after surgery. Elevate the leg when you are not up walking on it.    Continue to use the breathing machine which will help keep your temperature down. It is common for your temperature to cycle up and down following surgery, especially at night when you are not up moving around and exerting yourself. The breathing machine keeps your lungs expanded and your temperature down. Do not place pillow under the operative knee, focus on keeping  the knee straight while resting  DIET You may resume your previous home diet once you are discharged from the hospital.  DRESSING / WOUND CARE / SHOWERING Keep your bulky bandage on for 2 days. On the third post-operative day you may remove the Ace bandage and gauze. There is a waterproof adhesive bandage on your skin which will stay in place until your first follow-up appointment. Once you remove this you will not need to place another bandage You may begin showering 3 days following surgery, but do not submerge the incision under water.  ACTIVITY For the first 5 days, the key is rest and control of pain and swelling Do your home exercises twice a day starting on post-operative day 3. On the days you go to physical therapy, just do the home exercises once that day. You should rest, ice and elevate the leg for 50 minutes out of every hour. Get up and walk/stretch for 10 minutes per hour. After 5 days you can increase your activity slowly as tolerated. Walk with your walker as instructed. Use the walker until you are comfortable transitioning to a cane. Walk with the cane in the opposite hand of the operative leg. You may discontinue the cane once you are comfortable and walking steadily. Avoid periods of inactivity such as sitting longer than an hour when not asleep. This helps prevent blood clots.  You may discontinue the knee immobilizer once you are able to perform a straight leg raise while lying down. You may resume a sexual relationship in one month or when given the OK by your doctor.  You may return to work once you are cleared by your doctor.  Do not drive a car for 6 weeks or until released by your surgeon.  Do not drive while taking narcotics.  TED HOSE STOCKINGS Wear the elastic stockings on both legs for three weeks following surgery during the day. You may remove them at night for sleeping.  WEIGHT BEARING Weight bearing as tolerated with assist device (walker, cane, etc) as  directed, use it as long as suggested by your surgeon or therapist, typically at least 4-6 weeks.  POSTOPERATIVE CONSTIPATION PROTOCOL Constipation - defined medically as fewer than three stools per week and severe constipation as less than one stool per week.  One of the most common issues patients have following surgery is constipation.  Even if you have a regular bowel pattern at home, your normal regimen is likely to be disrupted due to multiple reasons following surgery.  Combination of anesthesia, postoperative narcotics, change in appetite and fluid intake all can affect your bowels.  In order to avoid complications following surgery, here are some recommendations in order to help you during your recovery period.  Colace (docusate) - Pick up an over-the-counter form of Colace or another stool softener and take twice a day as long as you are requiring postoperative pain medications.  Take with a full glass of water daily.  If you experience loose stools or diarrhea, hold the colace until you stool forms back up. If your symptoms do not get better within 1 week or if they get worse, check with your doctor. Dulcolax (bisacodyl) - Pick up over-the-counter and take as directed by the product packaging as needed to assist with the movement of your bowels.  Take with a full glass of water.  Use this product as needed if not relieved by Colace only.  MiraLax (polyethylene glycol) - Pick up over-the-counter to have on hand. MiraLax is a solution that will increase the amount of water in your bowels to assist with bowel movements.  Take as directed and can mix with a glass of water, juice, soda, coffee, or tea. Take if you go more than two days without a movement. Do not use MiraLax more than once per day. Call your doctor if you are still constipated or irregular after using this medication for 7 days in a row.  If you continue to have problems with postoperative constipation, please contact the office for  further assistance and recommendations.  If you experience "the worst abdominal pain ever" or develop nausea or vomiting, please contact the office immediatly for further recommendations for treatment.  ITCHING If you experience itching with your medications, try taking only a single pain pill, or even half a pain pill at a time.  You can also use Benadryl over the counter for itching or also to help with sleep.   MEDICATIONS See your medication summary on the "After Visit Summary" that the nursing staff will review with you prior to discharge.  You may have some home medications which will be placed on hold until you complete the course of blood thinner medication.  It is important for you to complete the blood thinner medication as prescribed by your surgeon.  Continue your approved medications as instructed at time of discharge.  PRECAUTIONS If you experience chest pain or shortness of breath - call 911 immediately for transfer to the hospital emergency department.  If you develop a fever greater that 101 F, purulent drainage from wound, increased redness or drainage from  wound, foul odor from the wound/dressing, or calf pain - CONTACT YOUR SURGEON.                                                   FOLLOW-UP APPOINTMENTS Make sure you keep all of your appointments after your operation with your surgeon and caregivers. You should call the office at the above phone number and make an appointment for approximately two weeks after the date of your surgery or on the date instructed by your surgeon outlined in the "After Visit Summary".  RANGE OF MOTION AND STRENGTHENING EXERCISES  Rehabilitation of the knee is important following a knee injury or an operation. After just a few days of immobilization, the muscles of the thigh which control the knee become weakened and shrink (atrophy). Knee exercises are designed to build up the tone and strength of the thigh muscles and to improve knee motion. Often  times heat used for twenty to thirty minutes before working out will loosen up your tissues and help with improving the range of motion but do not use heat for the first two weeks following surgery. These exercises can be done on a training (exercise) mat, on the floor, on a table or on a bed. Use what ever works the best and is most comfortable for you Knee exercises include:  Leg Lifts - While your knee is still immobilized in a splint or cast, you can do straight leg raises. Lift the leg to 60 degrees, hold for 3 sec, and slowly lower the leg. Repeat 10-20 times 2-3 times daily. Perform this exercise against resistance later as your knee gets better.  Quad and Hamstring Sets - Tighten up the muscle on the front of the thigh (Quad) and hold for 5-10 sec. Repeat this 10-20 times hourly. Hamstring sets are done by pushing the foot backward against an object and holding for 5-10 sec. Repeat as with quad sets.  Leg Slides: Lying on your back, slowly slide your foot toward your buttocks, bending your knee up off the floor (only go as far as is comfortable). Then slowly slide your foot back down until your leg is flat on the floor again. Angel Wings: Lying on your back spread your legs to the side as far apart as you can without causing discomfort.  A rehabilitation program following serious knee injuries can speed recovery and prevent re-injury in the future due to weakened muscles. Contact your doctor or a physical therapist for more information on knee rehabilitation.   POST-OPERATIVE OPIOID TAPER INSTRUCTIONS: It is important to wean off of your opioid medication as soon as possible. If you do not need pain medication after your surgery it is ok to stop day one. Opioids include: Codeine, Hydrocodone(Norco, Vicodin), Oxycodone(Percocet, oxycontin) and hydromorphone amongst others.  Long term and even short term use of opiods can cause: Increased pain  response Dependence Constipation Depression Respiratory depression And more.  Withdrawal symptoms can include Flu like symptoms Nausea, vomiting And more Techniques to manage these symptoms Hydrate well Eat regular healthy meals Stay active Use relaxation techniques(deep breathing, meditating, yoga) Do Not substitute Alcohol to help with tapering If you have been on opioids for less than two weeks and do not have pain than it is ok to stop all together.  Plan to wean off of opioids This plan should start  within one week post op of your joint replacement. Maintain the same interval or time between taking each dose and first decrease the dose.  Cut the total daily intake of opioids by one tablet each day Next start to increase the time between doses. The last dose that should be eliminated is the evening dose.   IF YOU ARE TRANSFERRED TO A SKILLED REHAB FACILITY If the patient is transferred to a skilled rehab facility following release from the hospital, a list of the current medications will be sent to the facility for the patient to continue.  When discharged from the skilled rehab facility, please have the facility set up the patient's Home Health Physical Therapy prior to being released. Also, the skilled facility will be responsible for providing the patient with their medications at time of release from the facility to include their pain medication, the muscle relaxants, and their blood thinner medication. If the patient is still at the rehab facility at time of the two week follow up appointment, the skilled rehab facility will also need to assist the patient in arranging follow up appointment in our office and any transportation needs.  MAKE SURE YOU:  Understand these instructions.  Get help right away if you are not doing well or get worse.   DENTAL ANTIBIOTICS:  In most cases prophylactic antibiotics for Dental procdeures after total joint surgery are not  necessary.  Exceptions are as follows:  1. History of prior total joint infection  2. Severely immunocompromised (Organ Transplant, cancer chemotherapy, Rheumatoid biologic meds such as Humera)  3. Poorly controlled diabetes (A1C &gt; 8.0, blood glucose over 200)  If you have one of these conditions, contact your surgeon for an antibiotic prescription, prior to your dental procedure.    Pick up stool softner and laxative for home use following surgery while on pain medications. Do not submerge incision under water. Please use good hand washing techniques while changing dressing each day. May shower starting three days after surgery. Please use a clean towel to pat the incision dry following showers. Continue to use ice for pain and swelling after surgery. Do not use any lotions or creams on the incision until instructed by your surgeon.

## 2023-09-25 NOTE — Interval H&P Note (Signed)
 History and Physical Interval Note:  09/25/2023 9:31 AM  Caleb Cardenas  has presented today for surgery, with the diagnosis of right knee osteoarthritis.  The various methods of treatment have been discussed with the patient and family. After consideration of risks, benefits and other options for treatment, the patient has consented to  Procedure(s): ARTHROPLASTY, KNEE, TOTAL (Right) as a surgical intervention.  The patient's history has been reviewed, patient examined, no change in status, stable for surgery.  I have reviewed the patient's chart and labs.  Questions were answered to the patient's satisfaction.     Homero Fellers Nycole Kawahara

## 2023-09-25 NOTE — Anesthesia Preprocedure Evaluation (Addendum)
 Anesthesia Evaluation  Patient identified by MRN, date of birth, ID band Patient awake    Reviewed: Allergy & Precautions, NPO status , Patient's Chart, lab work & pertinent test results, reviewed documented beta blocker date and time   Airway Mallampati: II  TM Distance: >3 FB     Dental no notable dental hx.    Pulmonary Patient abstained from smoking., former smoker   breath sounds clear to auscultation       Cardiovascular Exercise Tolerance: Poor hypertension, (-) angina + CAD and + Past MI  (-) Cardiac Stents, (-) CABG and (-) Peripheral Vascular Disease + Valvular Problems/Murmurs AI  Rhythm:Regular Rate:Normal  S/p mediastinal mass resection  Last TTE with low normal LV function, normal RV, mild-mod AI   Neuro/Psych CVA, No Residual Symptoms    GI/Hepatic ,neg GERD  ,,(+) neg Cirrhosis        Endo/Other    Renal/GU CRFRenal disease     Musculoskeletal  (+) Arthritis , Osteoarthritis,    Abdominal   Peds  Hematology   Anesthesia Other Findings   Reproductive/Obstetrics                             Anesthesia Physical Anesthesia Plan  ASA: 3  Anesthesia Plan: Spinal   Post-op Pain Management: Regional block*   Induction:   PONV Risk Score and Plan: 2 and Ondansetron and Dexamethasone  Airway Management Planned: Natural Airway and Simple Face Mask  Additional Equipment:   Intra-op Plan:   Post-operative Plan: Extubation in OR  Informed Consent: I have reviewed the patients History and Physical, chart, labs and discussed the procedure including the risks, benefits and alternatives for the proposed anesthesia with the patient or authorized representative who has indicated his/her understanding and acceptance.     Dental advisory given  Plan Discussed with: CRNA  Anesthesia Plan Comments:        Anesthesia Quick Evaluation

## 2023-09-25 NOTE — Transfer of Care (Signed)
 Immediate Anesthesia Transfer of Care Note  Patient: Caleb Cardenas  Procedure(s) Performed: Procedure(s): ARTHROPLASTY, KNEE, TOTAL (Right)  Patient Location: PACU  Anesthesia Type:General  Level of Consciousness:  sedated, patient cooperative and responds to stimulation  Airway & Oxygen Therapy:Patient Spontanous Breathing and Patient connected to face mask oxgen  Post-op Assessment:  Report given to PACU RN and Post -op Vital signs reviewed and stable  Post vital signs:  Reviewed and stable  Last Vitals:  Vitals:   09/25/23 1046 09/25/23 1051  BP: (!) 156/66 (!) 151/77  Pulse: (!) 54 65  Resp: 15 13  Temp:    SpO2: 99% 100%    Complications: No apparent anesthesia complications

## 2023-09-25 NOTE — Progress Notes (Signed)
 Orthopedic Tech Progress Note Patient Details:  [name] February 10, 1956 [id number]  Patient ID: Caleb Cardenas, male   DOB: Jun 16, 1956, 68 y.o.   MRN: [id number] CPM removed by 3W staff. Darleen Crocker 09/25/2023, 5:27 PM

## 2023-09-25 NOTE — Anesthesia Procedure Notes (Signed)
 Anesthesia Regional Block: Adductor canal block   Pre-Anesthetic Checklist: , timeout performed,  Correct Patient, Correct Site, Correct Laterality,  Correct Procedure, Correct Position, site marked,  Risks and benefits discussed,  Surgical consent,  Pre-op evaluation,  At surgeon's request and post-op pain management  Laterality: Right  Prep: Maximum Sterile Barrier Precautions used, chloraprep       Needles:  Injection technique: Single-shot  Needle Type: Echogenic Needle      Needle Gauge: 20     Additional Needles:   Procedures:,,,, ultrasound used (permanent image in chart),,    Narrative:  Start time: 09/25/2023 10:58 AM End time: 09/25/2023 11:03 AM Injection made incrementally with aspirations every 5 mL.  Performed by: Personally  Anesthesiologist: Mariann Barter, MD

## 2023-09-25 NOTE — Op Note (Signed)
 OPERATIVE REPORT-TOTAL KNEE ARTHROPLASTY   Pre-operative diagnosis- Osteoarthritis  Right knee(s)  Post-operative diagnosis- Osteoarthritis Right knee(s)  Procedure-  Right  Total Knee Arthroplasty  Surgeon- Caleb Rankin. Shaivi Rothschild, MD  Assistant- Arther Abbott, PA-C   Anesthesia-   Adductor canal block and spinal  EBL-50 mL   Drains None  Tourniquet time-  Total Tourniquet Time Documented: Thigh (Right) - 36 minutes Total: Thigh (Right) - 36 minutes     Complications- None  Condition-PACU - hemodynamically stable.   Brief Clinical Note  Caleb Cardenas is a 68 y.o. year old male with end stage OA of his right knee with progressively worsening pain and dysfunction. He has constant pain, with activity and at rest and significant functional deficits with difficulties even with ADLs. He has had extensive non-op management including analgesics, injections of cortisone and viscosupplements, and home exercise program, but remains in significant pain with significant dysfunction. Radiographs show bone on bone arthritis lateral and patellofemoral with valgus deformity. He presents now for right Total Knee Arthroplasty.     Procedure in detail---   The patient is brought into the operating room and positioned supine on the operating table. After successful administration of  Adductor canal block and spinal,   a tourniquet is placed high on the  Right thigh(s) and the lower extremity is prepped and draped in the usual sterile fashion. Time out is performed by the operating team and then the  Right lower extremity is wrapped in Esmarch, knee flexed and the tourniquet inflated to 300 mmHg.       A midline incision is made with a ten blade through the subcutaneous tissue to the level of the extensor mechanism. A fresh blade is used to make a medial parapatellar arthrotomy. Soft tissue over the proximal medial tibia is subperiosteally elevated to the joint line with a knife and into the  semimembranosus bursa with a Cobb elevator. Soft tissue over the proximal lateral tibia is elevated with attention being paid to avoiding the patellar tendon on the tibial tubercle. The patella is everted, knee flexed 90 degrees and the ACL and PCL are removed. Findings are bone on bone lateral and patellofemoral        The drill is used to create a starting hole in the distal femur and the canal is thoroughly irrigated with sterile saline to remove the fatty contents. The 5 degree Right  valgus alignment guide is placed into the femoral canal and the distal femoral cutting block is pinned to remove 10 mm off the distal femur. Resection is made with an oscillating saw.      The tibia is subluxed forward and the menisci are removed. The extramedullary alignment guide is placed referencing proximally at the medial aspect of the tibial tubercle and distally along the second metatarsal axis and tibial crest. The block is pinned to remove 2mm off the more deficient lateral  side. Resection is made with an oscillating saw. Size 7is the most appropriate size for the tibia and the proximal tibia is prepared with the modular drill and keel punch for that size.      The femoral sizing guide is placed and size 6 is most appropriate. Rotation is marked off the epicondylar axis and confirmed by creating a rectangular flexion gap at 90 degrees. The size 6 cutting block is pinned in this rotation and the anterior, posterior and chamfer cuts are made with the oscillating saw. The intercondylar block is then placed and that cut is made.  Trial size 7 tibial component, trial size 6 posterior stabilized femur and a 10  mm posterior stabilized rotating platform insert trial is placed. Full extension is achieved with excellent varus/valgus and anterior/posterior balance throughout full range of motion. The patella is everted and thickness measured to be 27  mm. Free hand resection is taken to 15 mm, a 41 template is placed, lug  holes are drilled, trial patella is placed, and it tracks normally. Osteophytes are removed off the posterior femur with the trial in place. All trials are removed and the cut bone surfaces prepared with pulsatile lavage. Cement is mixed and once ready for implantation, the size 7 tibial implant, size  6 posterior stabilized femoral component, and the size 41 patella are cemented in place and the patella is held with the clamp. The trial insert is placed and the knee held in full extension. The Exparel (20 ml mixed with 60 ml saline) is injected into the extensor mechanism, posterior capsule, medial and lateral gutters and subcutaneous tissues.  All extruded cement is removed and once the cement is hard the permanent 10 mm posterior stabilized rotating platform insert is placed into the tibial tray.      The wound is copiously irrigated with saline solution and the extensor mechanism closed with # 0 Stratofix suture. The tourniquet is released for a total tourniquet time of 36  minutes. Flexion against gravity is 140 degrees and the patella tracks normally. Subcutaneous tissue is closed with 2.0 vicryl and subcuticular with running 4.0 Monocryl. The incision is cleaned and dried and steri-strips and a bulky sterile dressing are applied. The limb is placed into a knee immobilizer and the patient is awakened and transported to recovery in stable condition.      Please note that a surgical assistant was a medical necessity for this procedure in order to perform it in a safe and expeditious manner. Surgical assistant was necessary to retract the ligaments and vital neurovascular structures to prevent injury to them and also necessary for proper positioning of the limb to allow for anatomic placement of the prosthesis.   Caleb Rankin Jalan Bodi, MD    09/25/2023, 12:23 PM

## 2023-09-26 ENCOUNTER — Encounter (HOSPITAL_COMMUNITY): Payer: Self-pay | Admitting: Orthopedic Surgery

## 2023-09-26 DIAGNOSIS — M1711 Unilateral primary osteoarthritis, right knee: Secondary | ICD-10-CM | POA: Diagnosis not present

## 2023-09-26 LAB — CBC
HCT: 36.1 % — ABNORMAL LOW (ref 39.0–52.0)
Hemoglobin: 11.7 g/dL — ABNORMAL LOW (ref 13.0–17.0)
MCH: 30 pg (ref 26.0–34.0)
MCHC: 32.4 g/dL (ref 30.0–36.0)
MCV: 92.6 fL (ref 80.0–100.0)
Platelets: 147 10*3/uL — ABNORMAL LOW (ref 150–400)
RBC: 3.9 MIL/uL — ABNORMAL LOW (ref 4.22–5.81)
RDW: 13.8 % (ref 11.5–15.5)
WBC: 11.2 10*3/uL — ABNORMAL HIGH (ref 4.0–10.5)
nRBC: 0 % (ref 0.0–0.2)

## 2023-09-26 LAB — BASIC METABOLIC PANEL WITH GFR
Anion gap: 6 (ref 5–15)
BUN: 30 mg/dL — ABNORMAL HIGH (ref 8–23)
CO2: 22 mmol/L (ref 22–32)
Calcium: 8 mg/dL — ABNORMAL LOW (ref 8.9–10.3)
Chloride: 110 mmol/L (ref 98–111)
Creatinine, Ser: 1.78 mg/dL — ABNORMAL HIGH (ref 0.61–1.24)
GFR, Estimated: 41 mL/min — ABNORMAL LOW (ref >60)
Glucose, Bld: 145 mg/dL — ABNORMAL HIGH (ref 70–99)
Potassium: 4.7 mmol/L (ref 3.5–5.1)
Sodium: 138 mmol/L (ref 135–145)

## 2023-09-26 MED ORDER — METHOCARBAMOL 500 MG PO TABS: 500.0000 mg | ORAL_TABLET | Freq: Four times a day (QID) | ORAL | 0 refills | Status: AC | PRN

## 2023-09-26 MED ORDER — GABAPENTIN 300 MG PO CAPS: ORAL_CAPSULE | ORAL | 0 refills | Status: AC

## 2023-09-26 MED ORDER — ASPIRIN 325 MG PO TBEC
325.0000 mg | DELAYED_RELEASE_TABLET | Freq: Every day | ORAL | 0 refills | Status: AC
Start: 2023-09-26 — End: 2023-10-16

## 2023-09-26 MED ORDER — OXYCODONE HCL 5 MG PO TABS
5.0000 mg | ORAL_TABLET | Freq: Four times a day (QID) | ORAL | 0 refills | Status: AC | PRN
Start: 2023-09-26 — End: ?

## 2023-09-26 MED ORDER — ONDANSETRON HCL 4 MG PO TABS: 4.0000 mg | ORAL_TABLET | Freq: Four times a day (QID) | ORAL | 0 refills | Status: AC | PRN

## 2023-09-26 MED ORDER — TRAMADOL HCL 50 MG PO TABS: 50.0000 mg | ORAL_TABLET | Freq: Four times a day (QID) | ORAL | 0 refills | Status: AC | PRN

## 2023-09-26 NOTE — Progress Notes (Signed)
 Physical Therapy Treatment Patient Details Name: Caleb Cardenas MRN: [id number] DOB: 08-08-1955 Today's Date: 09/26/2023   History of Present Illness 68 yo male s/p R TKA 09/25/23. Hx of CVA, vertigo    PT Comments  POD # 1 am session Pt AxO x 3 pleasant and motivated.  Daughter present.  Pt was able to get self OOB and amb a functional distance.  Then returned to room to perform some TE's following HEP handout.  Instructed on proper tech, freq as well as use of ICE.   Addressed all mobility questions, discussed appropriate activity, educated on use of ICE.  Pt ready for D/C to home. Pt has yet to void.  Reported to RN.    If plan is discharge home, recommend the following: A little help with walking and/or transfers;A little help with bathing/dressing/bathroom;Assistance with cooking/housework;Assist for transportation;Help with stairs or ramp for entrance   Can travel by private vehicle        Equipment Recommendations  Rolling walker (2 wheels)    Recommendations for Other Services       Precautions / Restrictions Precautions Precautions: Fall;Knee Precaution/Restrictions Comments: no pillow under knee Restrictions Weight Bearing Restrictions Per Provider Order: No Other Position/Activity Restrictions: WBAT     Mobility  Bed Mobility Overal bed mobility: Needs Assistance Bed Mobility: Supine to Sit, Sit to Supine     Supine to sit: Supervision Sit to supine: Supervision   General bed mobility comments: Cues for safety and educated on use of belt to self assist LE    Transfers Overall transfer level: Needs assistance   Transfers: Sit to/from Stand Sit to Stand: Supervision           General transfer comment: Cues for safety, hand/LE placement.    Ambulation/Gait Ambulation/Gait assistance: Supervision Gait Distance (Feet): 75 Feet Assistive device: Rolling walker (2 wheels) Gait Pattern/deviations: Step-to pattern Gait velocity: decreased     General  Gait Details: Cues for safety, sequencing. Tolerated distance well. Daughter present.   Stairs Stairs:  (NO stairs to enter home)           Wheelchair Mobility     Tilt Bed    Modified Rankin (Stroke Patients Only)       Balance                                            Communication Communication Communication: No apparent difficulties  Cognition Arousal: Alert Behavior During Therapy: WFL for tasks assessed/performed   PT - Cognitive impairments: No apparent impairments                         Following commands: Intact      Cueing Cueing Techniques: Verbal cues  Exercises  Total Knee Replacement TE's following HEP handout 10 reps B LE ankle pumps 05 reps towel squeezes 05 reps knee presses 05 reps heel slides  05 reps SAQ's 05 reps SLR's 05 reps ABD Educated on use of gait belt to assist with TE's Followed by ICE     General Comments        Pertinent Vitals/Pain Pain Assessment Pain Assessment: Faces Faces Pain Scale: Hurts little more Pain Location: R knee Pain Descriptors / Indicators: Tender, Tightness, Operative site guarding Pain Intervention(s): Monitored during session, Premedicated before session, Repositioned, Ice applied    Home Living  Prior Function            PT Goals (current goals can now be found in the care plan section) Progress towards PT goals: Progressing toward goals    Frequency    7X/week      PT Plan      Co-evaluation              AM-PAC PT "6 Clicks" Mobility   Outcome Measure  Help needed turning from your back to your side while in a flat bed without using bedrails?: None Help needed moving from lying on your back to sitting on the side of a flat bed without using bedrails?: None Help needed moving to and from a bed to a chair (including a wheelchair)?: None Help needed standing up from a chair using your arms (e.g., wheelchair  or bedside chair)?: None Help needed to walk in hospital room?: A Little Help needed climbing 3-5 steps with a railing? : A Little 6 Click Score: 22    End of Session Equipment Utilized During Treatment: Gait belt Activity Tolerance: Patient tolerated treatment well Patient left: with family/visitor present;with call bell/phone within reach;in chair Nurse Communication: Mobility status (has yet to void) PT Visit Diagnosis: Other abnormalities of gait and mobility (R26.89);Pain Pain - Right/Left: Right Pain - part of body: Knee     Time: 1000-1045 PT Time Calculation (min) (ACUTE ONLY): 45 min  Charges:    $Gait Training: 8-22 mins $Therapeutic Exercise: 8-22 mins $Therapeutic Activity: 8-22 mins PT General Charges $$ ACUTE PT VISIT: 1 Visit                     Felecia Shelling  PTA Acute  Rehabilitation Services Office M-F          [phone]

## 2023-09-26 NOTE — Plan of Care (Signed)
  Problem: Safety: Goal: Ability to remain free from injury will improve Outcome: Progressing   Problem: Activity: Goal: Range of joint motion will improve Outcome: Progressing   Problem: Clinical Measurements: Goal: Postoperative complications will be avoided or minimized Outcome: Progressing   Problem: Pain Management: Goal: Pain level will decrease with appropriate interventions Outcome: Progressing

## 2023-09-26 NOTE — Plan of Care (Signed)
  Problem: Education: Goal: Knowledge of General Education information will improve Description: Including pain rating scale, medication(s)/side effects and non-pharmacologic comfort measures Outcome: Adequate for Discharge   Problem: Health Behavior/Discharge Planning: Goal: Ability to manage health-related needs will improve Outcome: Adequate for Discharge   Problem: Clinical Measurements: Goal: Ability to maintain clinical measurements within normal limits will improve Outcome: Adequate for Discharge Goal: Will remain free from infection Outcome: Adequate for Discharge Goal: Diagnostic test results will improve Outcome: Adequate for Discharge Goal: Respiratory complications will improve Outcome: Adequate for Discharge Goal: Cardiovascular complication will be avoided Outcome: Adequate for Discharge   Problem: Activity: Goal: Risk for activity intolerance will decrease Outcome: Adequate for Discharge   Problem: Nutrition: Goal: Adequate nutrition will be maintained Outcome: Adequate for Discharge   Problem: Coping: Goal: Level of anxiety will decrease Outcome: Adequate for Discharge   Problem: Elimination: Goal: Will not experience complications related to bowel motility Outcome: Adequate for Discharge Goal: Will not experience complications related to urinary retention Outcome: Adequate for Discharge   Problem: Pain Managment: Goal: General experience of comfort will improve and/or be controlled Outcome: Adequate for Discharge   Problem: Safety: Goal: Ability to remain free from injury will improve Outcome: Adequate for Discharge   Problem: Skin Integrity: Goal: Risk for impaired skin integrity will decrease Outcome: Adequate for Discharge   Problem: Education: Goal: Knowledge of the prescribed therapeutic regimen will improve Outcome: Adequate for Discharge Goal: Individualized Educational Video(s) Outcome: Adequate for Discharge   Problem:  Activity: Goal: Ability to avoid complications of mobility impairment will improve Outcome: Adequate for Discharge Goal: Range of joint motion will improve Outcome: Adequate for Discharge   Problem: Clinical Measurements: Goal: Postoperative complications will be avoided or minimized Outcome: Adequate for Discharge   Problem: Skin Integrity: Goal: Will show signs of wound healing Outcome: Adequate for Discharge   Problem: Pain Management: Goal: Pain level will decrease with appropriate interventions Outcome: Adequate for Discharge

## 2023-09-26 NOTE — Progress Notes (Signed)
 Subjective: 1 Day Post-Op Procedure(s) (LRB): ARTHROPLASTY, KNEE, TOTAL (Right) Patient reports pain as mild.   Patient seen in rounds by Dr. Lequita Halt. Patient is well, and has had no acute complaints or problems No issues overnight. Denies chest pain, SOB, or calf pain. Foley catheter to be removed this AM.  We will continue therapy today, ambulated 50' yesterday.   Objective: Vital signs in last 24 hours: Temp:  [96.6 F (35.9 C)-97.8 F (36.6 C)] 97.8 F (36.6 C) (04/08 0551) Pulse Rate:  [51-66] 65 (04/08 0551) Resp:  [10-18] 18 (04/08 0551) BP: (112-156)/(56-79) 137/62 (04/08 0551) SpO2:  [92 %-100 %] 97 % (04/08 0551) Weight:  [79.4 kg] 79.4 kg (04/07 0944)  Intake/Output from previous day:  Intake/Output Summary (Last 24 hours) at 09/26/2023 0823 Last data filed at 09/26/2023 0640 Gross per 24 hour  Intake 2822.31 ml  Output 2350 ml  Net 472.31 ml     Intake/Output this shift: No intake/output data recorded.  Labs: Recent Labs    09/26/23 0340  HGB 11.7*   Recent Labs    09/26/23 0340  WBC 11.2*  RBC 3.90*  HCT 36.1*  PLT 147*   Recent Labs    09/26/23 0340  NA 138  K 4.7  CL 110  CO2 22  BUN 30*  CREATININE 1.78*  GLUCOSE 145*  CALCIUM 8.0*   No results for input(s): "LABPT", "INR" in the last 72 hours.  Exam: General - Patient is Alert and Oriented Extremity - Neurologically intact Neurovascular intact Sensation intact distally Dorsiflexion/Plantar flexion intact Dressing - dressing C/D/I Motor Function - intact, moving foot and toes well on exam.   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   Weight as of this encounter: 79.4 kg. Advance diet Up with therapy D/C IV fluids   Patient's anticipated LOS is less than 2 midnights, meeting these requirements: - Lives within 1 hour of care - Has a competent adult at home to recover with post-op recover - NO history of             - Chronic pain requiring opiods             - Diabetes             - Heart failure             - Heart attack             - DVT/VTE             - Cardiac arrhythmia             - Respiratory Failure/COPD             - Renal failure             - Anemia             - Advanced Liver disease     DVT Prophylaxis -  Aspirin and Plavix Weight bearing as tolerated. Continue therapy.  Plan is to go Home after hospital stay. Plan for discharge later today if progresses with therapy and meeting goals. Scheduled for OPPT at [facility]. Follow-up in the office in 2 weeks.  The PDMP database was reviewed today prior to any opioid medications being prescribed to this patient.  Arther Abbott, PA-C Orthopedic Surgery [phone] 09/26/2023, 8:23 AM

## 2023-09-26 NOTE — Care Management Obs Status (Signed)
 MEDICARE OBSERVATION STATUS NOTIFICATION   Patient Details  Name: Caleb Cardenas MRN: [id number] Date of Birth: Jun 01, 1956   Medicare Observation Status Notification Given:  Yes    Howell Rucks, RN 09/26/2023, 10:58 AM

## 2023-09-26 NOTE — TOC Transition Note (Signed)
 Transition of Care [facility]) - Discharge Note   Patient Details  Name: Caleb Cardenas MRN: [id number] Date of Birth: 04/10/1956  [facility] CM/SW Contact:  Howell Rucks, RN Phone Number: 09/26/2023, 11:54 AM   Clinical Narrative: Met with pt at bedside to review dc therapy and home equipment needs, pt confirmed OPPT-EO, has RW, no home DME needs. No TOC needs.       Final next level of care: OP Rehab Barriers to Discharge: No Barriers Identified   Patient Goals and CMS Choice Patient states their goals for this hospitalization and ongoing recovery are:: return home          Discharge Placement                       Discharge Plan and Services Additional resources added to the After Visit Summary for                                       Social Drivers of Health (SDOH) Interventions SDOH Screenings   Food Insecurity: No Food Insecurity (09/25/2023)  Housing: Low Risk  (09/25/2023)  Transportation Needs: No Transportation Needs (09/25/2023)  Utilities: Not At Risk (09/25/2023)  Social Connections: Moderately Integrated (09/25/2023)  Tobacco Use: Medium Risk (09/25/2023)     Readmission Risk Interventions     No data to display

## 2023-10-02 NOTE — Discharge Summary (Signed)
 Patient ID: VASH QUEZADA MRN: [id number] DOB/AGE: 12/18/1955 68 y.o.  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 male who was admitted 09/25/2023 for operative treatment ofOA (osteoarthritis) of knee. Patient has severe unremitting pain that affects sleep, daily activities, and work/hobbies. After pre-op clearance the patient was taken to the operating room on 09/25/2023 and underwent  Procedure(s): ARTHROPLASTY, KNEE, TOTAL.    Patient was given perioperative antibiotics:  Anti-infectives (From admission, onward)    Start     Dose/Rate Route Frequency Ordered Stop   09/25/23 1800  ceFAZolin (ANCEF) IVPB 2g/100 mL premix        2 g 200 mL/hr over 30 Minutes Intravenous Every 6 hours 09/25/23 1447 09/26/23 0102   09/25/23 0930  ceFAZolin (ANCEF) IVPB 2g/100 mL premix        2 g 200 mL/hr over 30 Minutes Intravenous On call to O.R. 09/25/23 0917 09/25/23 1116        Patient was given sequential compression devices, early ambulation, and chemoprophylaxis to prevent DVT.  Patient benefited maximally from hospital stay and there were no complications.    Recent vital signs: No data found.   Recent laboratory studies: No results for input(s): "WBC", "HGB", "HCT", "PLT", "NA", "K", "CL", "CO2", "BUN", "CREATININE", "GLUCOSE", "INR", "CALCIUM" in the last 72 hours.  Invalid input(s): "PT", "2"   Discharge Medications:    Allergies as of 09/26/2023   No Known Allergies      Medication List     TAKE these medications    amLODipine 10 MG tablet Commonly known as: NORVASC Take 1 tablet (10 mg total) by mouth daily.   aspirin EC 325 MG tablet Take 1 tablet (325 mg total) by mouth daily for 20 days. Then take one 81 mg aspirin once a day for three weeks. Then discontinue aspirin.   atorvastatin 80 MG tablet Commonly known as: LIPITOR Take 80 mg by mouth daily.   clopidogrel 75 MG tablet Commonly known as: PLAVIX Take 1 tablet (75 mg total) by mouth daily.   gabapentin 300 MG capsule Commonly known as: NEURONTIN Take a 300 mg capsule three times a day for two weeks following surgery.Then take a 300 mg capsule two times a day for two weeks. Then take a 300 mg capsule once a day for two weeks. Then discontinue.   lisinopril-hydrochlorothiazide 10-12.5 MG tablet Commonly known as: ZESTORETIC Take 1 tablet by mouth daily.   methocarbamol 500 MG tablet Commonly known as: ROBAXIN Take 1 tablet (500 mg total) by mouth every 6 (six) hours as needed for muscle spasms.   metoprolol succinate 25 MG 24 hr tablet Commonly known as: TOPROL-XL TAKE 1 TABLET (25 MG TOTAL) BY MOUTH DAILY.   ondansetron 4 MG tablet Commonly known as: ZOFRAN Take 1 tablet (4 mg total) by mouth every 6 (six) hours as needed for nausea.   oxyCODONE 5 MG  immediate release tablet Commonly known as: Oxy IR/ROXICODONE Take 1-2 tablets (5-10 mg total) by mouth every 6 (six) hours as needed for severe pain (pain score 7-10).   traMADol 50 MG tablet Commonly known as: ULTRAM Take 1-2 tablets (50-100 mg total) by mouth every 6 (six) hours as needed for moderate pain (pain score 4-6).               Discharge Care Instructions  (From admission, onward)           Start     Ordered   09/26/23 0000  Weight bearing as tolerated        09/26/23 0826   09/26/23 0000  Change dressing       Comments: You may remove the  bulky bandage (ACE wrap and gauze) two days after surgery. You will have an adhesive waterproof bandage underneath. Leave this in place until your first follow-up appointment.   09/26/23 0826            Diagnostic Studies: ECHOCARDIOGRAM COMPLETE Result Date: 09/11/2023    ECHOCARDIOGRAM REPORT   Patient Name:   TAYLER LASSEN Date of Exam: 09/11/2023 Medical Rec #:  [id number]       Height:       70.0 in Accession #:    [id number]      Weight:       173.0 lb Date of Birth:  1955/11/14       BSA:          1.963 m Patient Age:    68 years        BP:           151/76 mmHg Patient Gender: M               HR:           47 bpm. Exam Location:  [address] Procedure: 2D Echo, 3D Echo, Cardiac Doppler and Color Doppler (Both Spectral            and Color Flow Doppler were utilized during procedure). Indications:    Z01.810 Pre-op cardiovascular exam  History:        Patient has prior history of Echocardiogram examinations, most                 recent 04/20/2021. Carotid Disease and Stroke; Risk                 Factors:Hypertension and HLD.  Sonographer:    Juventino Oppenheim RCS Referring Phys: [id number] SUNIT TOLIA IMPRESSIONS  1. Left ventricular ejection fraction, by estimation, is 50 to 55%. The left ventricle has low normal function. The left ventricle demonstrates regional wall motion abnormalities (see scoring diagram/findings for description). There is mild left ventricular hypertrophy. Left ventricular diastolic parameters are indeterminate. There is mild hypokinesis of the left ventricular, basal-mid inferior wall.  2. Right ventricular systolic function is normal. The right ventricular size is normal.  3. The mitral valve is normal in structure. Mild mitral valve regurgitation.  4. Eccentric jets/splay of aortic regurgitation. The aortic valve is tricuspid. There is mild calcification of the aortic valve. There is mild thickening of the aortic valve. Aortic valve regurgitation is mild to moderate. Aortic  valve sclerosis/calcification is present, without any evidence of aortic stenosis.  5. The inferior vena cava is normal in size with greater than 50% respiratory variability, suggesting right atrial pressure of 3 mmHg. Comparison(s): No significant change from prior study. FINDINGS  Left Ventricle: Left  ventricular ejection fraction, by estimation, is 50 to 55%. The left ventricle has low normal function. The left ventricle demonstrates regional wall motion abnormalities. Mild hypokinesis of the left ventricular, basal-mid inferior  wall. The left ventricular internal cavity size was normal in size. There is mild left ventricular hypertrophy. Left ventricular diastolic parameters are indeterminate. Right Ventricle: The right ventricular size is normal. No increase in right ventricular wall thickness. Right ventricular systolic function is normal. Left Atrium: Left atrial size was normal in size. Right Atrium: Right atrial size was normal in size. Pericardium: There is no evidence of pericardial effusion. Mitral Valve: The mitral valve is normal in structure. Mild mitral valve regurgitation. Tricuspid Valve: The tricuspid valve is normal in structure. Tricuspid valve regurgitation is trivial. No evidence of tricuspid stenosis. Aortic Valve: Eccentric jets/splay of aortic regurgitation. The aortic valve is tricuspid. There is mild calcification of the aortic valve. There is mild thickening of the aortic valve. Aortic valve regurgitation is mild to moderate. Aortic regurgitation  PHT measures 3103 msec. Aortic valve sclerosis/calcification is present, without any evidence of aortic stenosis. Pulmonic Valve: The pulmonic valve was not well visualized. Pulmonic valve regurgitation is not visualized. No evidence of pulmonic stenosis. Aorta: The aortic root, ascending aorta, aortic arch and descending aorta are all structurally normal, with no evidence of dilitation or obstruction. Venous: The inferior vena cava is normal  in size with greater than 50% respiratory variability, suggesting right atrial pressure of 3 mmHg. IAS/Shunts: The atrial septum is grossly normal. Additional Comments: 3D was performed not requiring image post processing on an independent workstation and was normal. A device lead is visualized in the right atrium and right ventricle.  LEFT VENTRICLE PLAX 2D LVIDd:         5.30 cm   Diastology LVIDs:         3.60 cm   LV e' medial:    8.38 cm/s LV PW:         1.20 cm   LV E/e' medial:  9.8 LV IVS:        1.40 cm   LV e' lateral:   5.00 cm/s LVOT diam:     2.00 cm   LV E/e' lateral: 16.4 LV SV:         68 LV SV Index:   35 LVOT Area:     3.14 cm                           3D Volume EF:                          3D EF:        53 %                          LV EDV:       153 ml                          LV ESV:       72 ml                          LV SV:        81 ml RIGHT VENTRICLE RV Basal diam:  3.70 cm RV S prime:     14.90 cm/s TAPSE (M-mode): 1.8 cm LEFT  ATRIUM             Index        RIGHT ATRIUM           Index LA diam:        4.80 cm 2.45 cm/m   RA Area:     16.10 cm LA Vol (A2C):   29.0 ml 14.78 ml/m  RA Volume:   39.00 ml  19.87 ml/m LA Vol (A4C):   39.5 ml 20.13 ml/m LA Biplane Vol: 34.6 ml 17.63 ml/m  AORTIC VALVE LVOT Vmax:   90.20 cm/s LVOT Vmean:  64.400 cm/s LVOT VTI:    0.218 m AI PHT:      3103 msec  AORTA Ao Root diam: 2.80 cm Ao Asc diam:  3.50 cm MITRAL VALVE MV Area (PHT):             SHUNTS MV Decel Time:             Systemic VTI:  0.22 m MV E velocity: 81.80 cm/s  Systemic Diam: 2.00 cm MV A velocity: 88.00 cm/s MV E/A ratio:  0.93 Jodelle Red MD Electronically signed by Jodelle Red MD Signature Date/Time: 09/11/2023/9:27:45 PM    Final    MYOCARDIAL PERFUSION IMAGING Result Date: 09/06/2023   A pharmacological stress test was performed using IV Lexiscan 0.4mg  over 10 seconds performed without concurrent submaximal exercise. Normal blood pressure and normal heart rate  response noted during stress. Heart rate recovery was normal.   No ST deviation was noted. Arrhythmias during stress: occasional PVCs. Arrhythmias during recovery: occasional PVCs. ECG was interpretable and without significant changes. The ECG was not diagnostic due to pharmacologic protocol.   LV perfusion is abnormal. There is no evidence of ischemia. There is evidence of infarction. Defect 1: There is a medium defect with moderate reduction in uptake present in the mid to basal inferior and inferolateral location(s) that is fixed. There is abnormal wall motion in the defect area with hypokinesis of the mid inferolateral wall. Consistent with infarction. Defect 2: There is a small defect with moderate reduction in uptake present in the apical location(s) that is fixed. There is normal wall motion in the defect area. Consistent with artifact caused by diaphragmatic attenuation.   Left ventricular function is abnormal. Nuclear stress EF: 47%. The left ventricular ejection fraction is mildly decreased (45-54%). End diastolic cavity size is severely enlarged. End systolic cavity size is moderately enlarged. No evidence of transient ischemic dilation (TID) noted.   Prior study not available for comparison.   Findings are consistent with infarction. The study is intermediate risk.   CUP PACEART REMOTE DEVICE CHECK Result Date: 09/05/2023 ILR summary report received. Battery status OK. Normal device function. No new symptom, tachy, brady, or pause episodes. No new AF episodes. Monthly summary reports and ROV/PRN - CS, CVRS   Disposition: Discharge disposition: 01-Home or Self Care       Discharge Instructions     Call MD / Call 911   Complete by: As directed    If you experience chest pain or shortness of breath, CALL 911 and be transported to the hospital emergency room.  If you develope a fever above 101 F, pus (white drainage) or increased drainage or redness at the wound, or calf pain, call your  surgeon's office.   Change dressing   Complete by: As directed    You may remove the bulky bandage (ACE wrap and gauze) two days after surgery. You will have  an adhesive waterproof bandage underneath. Leave this in place until your first follow-up appointment.   Constipation Prevention   Complete by: As directed    Drink plenty of fluids.  Prune juice may be helpful.  You may use a stool softener, such as Colace (over the counter) 100 mg twice a day.  Use MiraLax (over the counter) for constipation as needed.   Diet - low sodium heart healthy   Complete by: As directed    Do not put a pillow under the knee. Place it under the heel.   Complete by: As directed    Driving restrictions   Complete by: As directed    No driving for two weeks   Post-operative opioid taper instructions:   Complete by: As directed    POST-OPERATIVE OPIOID TAPER INSTRUCTIONS: It is important to wean off of your opioid medication as soon as possible. If you do not need pain medication after your surgery it is ok to stop day one. Opioids include: Codeine, Hydrocodone(Norco, Vicodin), Oxycodone(Percocet, oxycontin) and hydromorphone amongst others.  Long term and even short term use of opiods can cause: Increased pain response Dependence Constipation Depression Respiratory depression And more.  Withdrawal symptoms can include Flu like symptoms Nausea, vomiting And more Techniques to manage these symptoms Hydrate well Eat regular healthy meals Stay active Use relaxation techniques(deep breathing, meditating, yoga) Do Not substitute Alcohol to help with tapering If you have been on opioids for less than two weeks and do not have pain than it is ok to stop all together.  Plan to wean off of opioids This plan should start within one week post op of your joint replacement. Maintain the same interval or time between taking each dose and first decrease the dose.  Cut the total daily intake of opioids by one  tablet each day Next start to increase the time between doses. The last dose that should be eliminated is the evening dose.      TED hose   Complete by: As directed    Use stockings (TED hose) for three weeks on both leg(s).  You may remove them at night for sleeping.   Weight bearing as tolerated   Complete by: As directed         Follow-up Information     Aluisio, Samuel Crock, MD. Schedule an appointment as soon as possible for a visit in 2 week(s).   Specialty: Orthopedic Surgery Contact information: [address] [place] [address] [phone]                  Signed: Sharlynn Dear 10/02/2023, 9:50 AM

## 2023-10-03 ENCOUNTER — Telehealth: Payer: Self-pay | Admitting: Adult Health

## 2023-10-03 NOTE — Telephone Encounter (Signed)
 Pt brother called to inquire about Pt medication. Returned call to brother, no answer, LVM making him aware he is not listed to be able to release information so we will not be able to discuss any Pt information with him. Did not leave Pt name in message.

## 2023-10-03 NOTE — Telephone Encounter (Signed)
 Pt's brother is asking for a call to discuss pt's  clopidogrel (PLAVIX) 75 MG tablet , the brother states he does not know the last time pt took the medication.  He would like a call to discuss if this is a medication pt needs to continue to take.

## 2023-10-05 ENCOUNTER — Telehealth: Payer: Self-pay | Admitting: Cardiology

## 2023-10-05 NOTE — Telephone Encounter (Signed)
 Spoke with patient and he states he think you told him to avoid tylenol at one point. He would like to know if its  ok for him to take now?

## 2023-10-05 NOTE — Telephone Encounter (Signed)
 Spoke with Athena Bland (Brother) per [facility] and he is aware of provider recommendation and he states they did consult PCP as well.

## 2023-10-05 NOTE — Telephone Encounter (Signed)
 Should reach out to his PCP for further guidance - I dont have any recent AST / ALT.  From cardiac standpoint can take Tylenol if no known liver issues.   Sadi Arave [place], DO, [facility]

## 2023-10-05 NOTE — Telephone Encounter (Signed)
 Pt c/o medication issue:  1. Name of Medication:  Tylenol   2. How are you currently taking this medication (dosage and times per day)?   3. Are you having a reaction (difficulty breathing--STAT)?   4. What is your medication issue?   Patient's brother would like to know if patient can take Tylenol. Please advise.

## 2023-10-09 ENCOUNTER — Ambulatory Visit (INDEPENDENT_AMBULATORY_CARE_PROVIDER_SITE_OTHER): Payer: Medicare HMO

## 2023-10-09 DIAGNOSIS — I639 Cerebral infarction, unspecified: Secondary | ICD-10-CM

## 2023-10-10 LAB — CUP PACEART REMOTE DEVICE CHECK
Date Time Interrogation Session: 20250420231539
Implantable Pulse Generator Implant Date: 20230911

## 2023-10-13 ENCOUNTER — Emergency Department (HOSPITAL_COMMUNITY)
Admission: EM | Admit: 2023-10-13 | Discharge: 2023-10-13 | Disposition: A | Discharge status: Home / Self Care | Attending: Emergency Medicine | Admitting: Emergency Medicine

## 2023-10-13 ENCOUNTER — Emergency Department (HOSPITAL_COMMUNITY)

## 2023-10-13 ENCOUNTER — Other Ambulatory Visit: Payer: Self-pay

## 2023-10-13 DIAGNOSIS — Z7902 Long term (current) use of antithrombotics/antiplatelets: Secondary | ICD-10-CM | POA: Diagnosis not present

## 2023-10-13 DIAGNOSIS — N183 Chronic kidney disease, stage 3 unspecified: Secondary | ICD-10-CM | POA: Diagnosis not present

## 2023-10-13 DIAGNOSIS — I2510 Atherosclerotic heart disease of native coronary artery without angina pectoris: Secondary | ICD-10-CM | POA: Insufficient documentation

## 2023-10-13 DIAGNOSIS — R0609 Other forms of dyspnea: Secondary | ICD-10-CM | POA: Diagnosis not present

## 2023-10-13 DIAGNOSIS — Z96651 Presence of right artificial knee joint: Secondary | ICD-10-CM | POA: Insufficient documentation

## 2023-10-13 DIAGNOSIS — R0602 Shortness of breath: Secondary | ICD-10-CM | POA: Diagnosis present

## 2023-10-13 DIAGNOSIS — Z8673 Personal history of transient ischemic attack (TIA), and cerebral infarction without residual deficits: Secondary | ICD-10-CM | POA: Insufficient documentation

## 2023-10-13 DIAGNOSIS — I129 Hypertensive chronic kidney disease with stage 1 through stage 4 chronic kidney disease, or unspecified chronic kidney disease: Secondary | ICD-10-CM | POA: Insufficient documentation

## 2023-10-13 DIAGNOSIS — Z79899 Other long term (current) drug therapy: Secondary | ICD-10-CM | POA: Insufficient documentation

## 2023-10-13 DIAGNOSIS — R7989 Other specified abnormal findings of blood chemistry: Secondary | ICD-10-CM

## 2023-10-13 DIAGNOSIS — Z7982 Long term (current) use of aspirin: Secondary | ICD-10-CM | POA: Insufficient documentation

## 2023-10-13 DIAGNOSIS — Z85238 Personal history of other malignant neoplasm of thymus: Secondary | ICD-10-CM | POA: Insufficient documentation

## 2023-10-13 LAB — BASIC METABOLIC PANEL WITH GFR
Anion gap: 11 (ref 5–15)
BUN: 57 mg/dL — ABNORMAL HIGH (ref 8–23)
CO2: 19 mmol/L — ABNORMAL LOW (ref 22–32)
Calcium: 9.3 mg/dL (ref 8.9–10.3)
Chloride: 104 mmol/L (ref 98–111)
Creatinine, Ser: 2.07 mg/dL — ABNORMAL HIGH (ref 0.61–1.24)
GFR, Estimated: 34 mL/min — ABNORMAL LOW (ref >60)
Glucose, Bld: 108 mg/dL — ABNORMAL HIGH (ref 70–99)
Potassium: 4.7 mmol/L (ref 3.5–5.1)
Sodium: 134 mmol/L — ABNORMAL LOW (ref 135–145)

## 2023-10-13 LAB — URINALYSIS, ROUTINE W REFLEX MICROSCOPIC
Bilirubin Urine: NEGATIVE
Glucose, UA: NEGATIVE mg/dL
Hgb urine dipstick: NEGATIVE
Ketones, ur: NEGATIVE mg/dL
Leukocytes,Ua: NEGATIVE
Nitrite: NEGATIVE
Protein, ur: NEGATIVE mg/dL
Specific Gravity, Urine: 1.034 — ABNORMAL HIGH (ref 1.005–1.030)
pH: 5 (ref 5.0–8.0)

## 2023-10-13 LAB — CBC
HCT: 36.8 % — ABNORMAL LOW (ref 39.0–52.0)
Hemoglobin: 11.8 g/dL — ABNORMAL LOW (ref 13.0–17.0)
MCH: 30.3 pg (ref 26.0–34.0)
MCHC: 32.1 g/dL (ref 30.0–36.0)
MCV: 94.4 fL (ref 80.0–100.0)
Platelets: 429 10*3/uL — ABNORMAL HIGH (ref 150–400)
RBC: 3.9 MIL/uL — ABNORMAL LOW (ref 4.22–5.81)
RDW: 15.1 % (ref 11.5–15.5)
WBC: 7.2 10*3/uL (ref 4.0–10.5)
nRBC: 0 % (ref 0.0–0.2)

## 2023-10-13 LAB — TROPONIN I (HIGH SENSITIVITY): Troponin I (High Sensitivity): 16 ng/L (ref <18)

## 2023-10-13 LAB — CBG MONITORING, ED: Glucose-Capillary: 96 mg/dL (ref 70–99)

## 2023-10-13 LAB — BRAIN NATRIURETIC PEPTIDE: B Natriuretic Peptide: 102.1 pg/mL — ABNORMAL HIGH (ref 0.0–100.0)

## 2023-10-13 MED ORDER — SODIUM CHLORIDE (PF) 0.9 % IJ SOLN
INTRAMUSCULAR | Status: AC
  Filled 2023-10-13: qty 50

## 2023-10-13 MED ORDER — IOHEXOL 350 MG/ML SOLN
75.0000 mL | Freq: Once | INTRAVENOUS | Status: AC | PRN
  Administered 2023-10-13: 75 mL via INTRAVENOUS

## 2023-10-13 MED ORDER — ACETAMINOPHEN 325 MG PO TABS
650.0000 mg | ORAL_TABLET | Freq: Once | ORAL | Status: AC
  Administered 2023-10-13: 650 mg via ORAL
  Filled 2023-10-13: qty 2

## 2023-10-13 MED ORDER — ACETAMINOPHEN 500 MG PO TABS
1000.0000 mg | ORAL_TABLET | Freq: Once | ORAL | Status: AC
  Administered 2023-10-13: 1000 mg via ORAL
  Filled 2023-10-13: qty 2

## 2023-10-13 NOTE — ED Triage Notes (Signed)
 Pt c/o dizziness, weakness, and SOB for 4x days. Pt had knee surgery 09/25/23. Dyspnea on extertion.  AOx4

## 2023-10-13 NOTE — ED Notes (Signed)
 Pt pulse ox maintained at 100% while ambulating a lap around the entire ER unit. Rolling walker used w/o difficulty.

## 2023-10-13 NOTE — ED Notes (Signed)
 Called lab to request add-on of BNP as ordered

## 2023-10-13 NOTE — ED Provider Notes (Signed)
 [place] EMERGENCY DEPARTMENT AT [facility] Provider Note   CSN: [id number] Arrival date & time: 10/13/23  1210     History  Chief Complaint  Patient presents with   Dizziness   Shortness of Breath   Weakness    Caleb Cardenas is a 68 y.o. male with history of stroke in 2021, CKD stage III, type B2 thymoma, CAD, hypertension, arthritis s/p knee surgery on 4/7 who presents the emergency department complaining of dizziness, weakness, shortness of breath for the past 4 days.  Patient states that he has been more dyspneic on exertion.  His brother is at bedside and states that he has seen the patient at fairly frequent intervals since surgery and he has noticed that he is getting worse.  The patient overall feels very out of it, and fatigued.  He thinks some of it had to do with some of the medications that he was on.  He would like to only take Tylenol  for pain, as he was having very vivid dreams and possibly hallucinating.  But he continues to have shortness of breath with exertion, that is getting progressively worse.  He just followed up with the orthopedist and they said that his right knee has been looking good.  He does not have significant swelling, and only has fairly minimal pain.  Denies any calf tenderness or swelling.  Denies any chest pain or headache.  Brother also reports he doesn't think patient was taking plavix  for several days during post-operative course. He states patient lives with him and they couldn't find the plavix  bottle for almost 6 days.    Dizziness Associated symptoms: shortness of breath and weakness   Shortness of Breath Weakness Associated symptoms: dizziness and shortness of breath        Home Medications Prior to Admission medications   Medication Sig Start Date End Date Taking? Authorizing Provider  amLODipine  (NORVASC ) 10 MG tablet Take 1 tablet (10 mg total) by mouth daily. 08/21/23   Tolia, Sunit, DO  aspirin  EC 325 MG tablet Take 1  tablet (325 mg total) by mouth daily for 20 days. Then take one 81 mg aspirin  once a day for three weeks. Then discontinue aspirin . 09/26/23 10/16/23  Edmisten, Onesimo Bijou, PA  atorvastatin  (LIPITOR) 80 MG tablet Take 80 mg by mouth daily.    [provider]  clopidogrel  (PLAVIX ) 75 MG tablet Take 1 tablet (75 mg total) by mouth daily. 09/20/22   Johny Nap, NP  gabapentin  (NEURONTIN ) 300 MG capsule Take a 300 mg capsule three times a day for two weeks following surgery.Then take a 300 mg capsule two times a day for two weeks. Then take a 300 mg capsule once a day for two weeks. Then discontinue. 09/26/23   Edmisten, Kristie L, PA  lisinopril -hydrochlorothiazide  (ZESTORETIC ) 10-12.5 MG tablet Take 1 tablet by mouth daily. 07/04/23   [provider]  methocarbamol  (ROBAXIN ) 500 MG tablet Take 1 tablet (500 mg total) by mouth every 6 (six) hours as needed for muscle spasms. 09/26/23   Edmisten, Onesimo Bijou, PA  metoprolol  succinate (TOPROL -XL) 25 MG 24 hr tablet TAKE 1 TABLET (25 MG TOTAL) BY MOUTH DAILY. 06/22/22   Boyce Byes, MD  ondansetron  (ZOFRAN ) 4 MG tablet Take 1 tablet (4 mg total) by mouth every 6 (six) hours as needed for nausea. 09/26/23   Edmisten, Onesimo Bijou, PA  oxyCODONE  (OXY IR/ROXICODONE ) 5 MG immediate release tablet Take 1-2 tablets (5-10 mg total) by mouth every 6 (  six) hours as needed for severe pain (pain score 7-10). 09/26/23   Edmisten, Kristie L, PA  traMADol  (ULTRAM ) 50 MG tablet Take 1-2 tablets (50-100 mg total) by mouth every 6 (six) hours as needed for moderate pain (pain score 4-6). 09/26/23   Edmisten, Kristie L, PA      Allergies    Patient has no known allergies.    Review of Systems   Review of Systems  Respiratory:  Positive for shortness of breath.   Neurological:  Positive for dizziness and weakness.  All other systems reviewed and are negative.   Physical Exam Updated Vital Signs BP (!) 150/65   Pulse (!) 57   Temp 97.9 F (36.6 C) (Oral)    Resp 18   Ht 5\' 10"  (1.778 m)   Wt 79.4 kg   SpO2 100%   BMI 25.11 kg/m  Physical Exam Vitals and nursing note reviewed.  Constitutional:      Appearance: Normal appearance.  HENT:     Head: Normocephalic and atraumatic.  Eyes:     Conjunctiva/sclera: Conjunctivae normal.  Cardiovascular:     Rate and Rhythm: Normal rate and regular rhythm.  Pulmonary:     Effort: Pulmonary effort is normal. No respiratory distress.     Breath sounds: Normal breath sounds.  Abdominal:     General: There is no distension.     Palpations: Abdomen is soft.     Tenderness: There is no abdominal tenderness.  Musculoskeletal:     Comments: Right knee swelling with well-healing incision, compartments of the lower legs are soft, calf nontender  Skin:    General: Skin is warm and dry.  Neurological:     General: No focal deficit present.     Mental Status: He is alert.     ED Results / Procedures / Treatments   Labs (all labs ordered are listed, but only abnormal results are displayed) Labs Reviewed  BASIC METABOLIC PANEL WITH GFR - Abnormal; Notable for the following components:      Result Value   Sodium 134 (*)    CO2 19 (*)    Glucose, Bld 108 (*)    BUN 57 (*)    Creatinine, Ser 2.07 (*)    GFR, Estimated 34 (*)    All other components within normal limits  CBC - Abnormal; Notable for the following components:   RBC 3.90 (*)    Hemoglobin 11.8 (*)    HCT 36.8 (*)    Platelets 429 (*)    All other components within normal limits  URINALYSIS, ROUTINE W REFLEX MICROSCOPIC  BRAIN NATRIURETIC PEPTIDE  CBG MONITORING, ED  TROPONIN I (HIGH SENSITIVITY)    EKG EKG Interpretation Date/Time:  Friday October 13 2023 12:20:51 EDT Ventricular Rate:  59 PR Interval:  193 QRS Duration:  102 QT Interval:  420 QTC Calculation: 416 R Axis:   63  Text Interpretation: Sinus rhythm Confirmed by Annita Kindle [phone]) on 10/13/2023 2:09:40 PM  Radiology No results  found.  Procedures Procedures    Medications Ordered in ED Medications  acetaminophen  (TYLENOL ) tablet 1,000 mg (1,000 mg Oral Given 10/13/23 1423)    ED Course/ Medical Decision Making/ A&P                                 Medical Decision Making Amount and/or Complexity of Data Reviewed Labs: ordered. Radiology: ordered.  Risk OTC drugs.  This patient  is a 68 y.o. male  who presents to the ED for concern of shortness of breath.   Differential diagnoses prior to evaluation: The emergent differential diagnosis includes, but is not limited to,  CHF, pericardial effusion/tamponade, arrhythmias, ACS, COPD, asthma, bronchitis, pneumonia, pneumothorax, PE, anemia. This is not an exhaustive differential.   Past Medical History / Co-morbidities / Social History: stroke in 2021, CKD stage III, type B2 thymoma, CAD, hypertension, arthritis s/p knee surgery on 4/7  Additional history: Chart reviewed. Pertinent results include: Pt had knee surgery with Dr France Ina on 4/7  Physical Exam: Physical exam performed. The pertinent findings include: Normal vital signs, maintains normal oxygen saturation on room air with good waveform.  Lung sounds clear, heart regular rate and rhythm.  Right knee with generalized swelling and surgical incision site appears to be healing well.  Compartments of the lower legs are soft, calf is nontender.  Neurovascularly intact.  Will obtain orthostatic vitals as well.   Lab Tests/Imaging studies: I personally interpreted labs/imaging and the pertinent results include: CBC and BMP grossly unremarkable compared to baseline. BNP pending.   CXR and CT PE study pending at time of shift change.   Cardiac monitoring: EKG obtained and interpreted by myself and attending physician which shows: Sinus rhythm   Medications: I ordered medication including Tylenol  per patient request.  I have reviewed the patients home medicines and have made adjustments as needed.    Disposition: Patient discussed and care transferred to Smoot PA-C at shift change. Please see his/her note for further details regarding further ED course and disposition. Plan at time of handoff is follow up on x-ray, CT PE study, BNP, and orthostatic vital signs.   Final Clinical Impression(s) / ED Diagnoses Final diagnoses:  Dyspnea on exertion    Rx / DC Orders ED Discharge Orders     None      Portions of this report may have been transcribed using voice recognition software. Every effort was made to ensure accuracy; however, inadvertent computerized transcription errors may be present.    Leva Rayas, PA-C 10/13/23 1508    Merdis Stalling, MD 10/14/23 [phone]

## 2023-10-13 NOTE — ED Notes (Signed)
 Pt states he is unable to void at this time but will try in a while.

## 2023-10-13 NOTE — ED Provider Notes (Signed)
 Care assumed from Lorin Roemhildt, PA-C at shift change.  Please see their note for further information. Physical Exam  BP 137/81   Pulse 65   Temp 97.8 F (36.6 C)   Resp 18   Ht 5\' 10"  (1.778 m)   Wt 79.4 kg   SpO2 100%   BMI 25.11 kg/m   Physical Exam Vitals and nursing note reviewed.  Constitutional:      General: He is not in acute distress.    Appearance: Normal appearance. He is normal weight. He is not ill-appearing, toxic-appearing or diaphoretic.  HENT:     Head: Normocephalic and atraumatic.  Cardiovascular:     Rate and Rhythm: Normal rate.  Pulmonary:     Effort: Pulmonary effort is normal. No respiratory distress.  Musculoskeletal:        General: Normal range of motion.     Cervical back: Normal range of motion.  Skin:    General: Skin is warm and dry.  Neurological:     General: No focal deficit present.     Mental Status: He is alert.  Psychiatric:        Mood and Affect: Mood normal.        Behavior: Behavior normal.     Procedures  Procedures  ED Course / MDM    Medical Decision Making Amount and/or Complexity of Data Reviewed Labs: ordered. Radiology: ordered.  Risk OTC drugs. Prescription drug management.   Briefly, patient complains of dizziness, weakness, dyspnea on exertion x 4 days.  Patient had a knee replacement on 4/7, has been progressing well since then.  Has followed up with his orthopedist who noted that his knee looked well.  Plan: BMP, troponin pending at shift change.  CTA PE study pending as well.  Will determine dispo.  Currently on room air.  No dyspnea noted with rest  7:00 PM:   Patient CTA has resulted and reveals  No acute intrathoracic abnormality; specifically, no pulmonary embolism, pneumonia, or pleural effusion.  I personally reviewed and interpreted this imaging and agree with radiology interpretation.  Patient's troponin is WNL.  BNP is 102.  Creatinine is 2.07, stable from baseline, slightly increased  from previous 1.78.   Upon reassessment, patient is feeling well, wants to go home.  Orthostatic vital signs WNL.  He is able to walk around without any dyspnea or drop in his oxygen saturation.  Does note that he discontinued several medications to have surgery and has not restarted these, recommend he discuss with his doctor restarting could be contributing to his symptoms.  Also emphasized the importance of adequate oral hydration given his slight increase in creatinine.  Recommend that he have a follow-up appointment with his PCP to have this rechecked in the next week or so. Evaluation and diagnostic testing in the emergency department does not suggest an emergent condition requiring admission or immediate intervention beyond what has been performed at this time.  Plan for discharge with close PCP follow-up.  Patient is understanding and amenable with plan, educated on red flag symptoms that would prompt immediate return.  Patient discharged in stable condition.      Fredna Jasper 10/13/23 1929    Afton Horse T, DO 10/14/23 0000

## 2023-10-13 NOTE — Discharge Instructions (Signed)
 As we discussed, your workup in the ER today was overall reassuring for acute findings.  Laboratory evaluation and CT imaging did not reveal an emergent cause of your symptoms.  Given that you are able to walk around without dropping your oxygen saturation, it is reasonable for you to be discharged home.  Like we discussed, your kidney function was mildly elevated today, however it is in line with where it has been previously.  This could be dehydration and therefore recommend that you increase your fluid intake for the next few days.  You should also have this rechecked through your primary care provider to ensure that it is not worsening.  Please call to schedule an appointment at your earliest convenience.  Return if development of any new or worsening symptoms.

## 2023-10-23 NOTE — Addendum Note (Signed)
 Addended by: Edra Govern D on: 10/23/2023 12:52 PM   Modules accepted: Orders

## 2023-10-23 NOTE — Progress Notes (Signed)
 Carelink Summary Report / Loop Recorder

## 2023-11-09 ENCOUNTER — Ambulatory Visit (INDEPENDENT_AMBULATORY_CARE_PROVIDER_SITE_OTHER)

## 2023-11-09 DIAGNOSIS — I639 Cerebral infarction, unspecified: Secondary | ICD-10-CM

## 2023-11-09 LAB — CUP PACEART REMOTE DEVICE CHECK
Date Time Interrogation Session: 20250521232107
Implantable Pulse Generator Implant Date: 20230911

## 2023-11-28 NOTE — Addendum Note (Signed)
 Addended by: Edra Govern D on: 11/28/2023 04:51 PM   Modules accepted: Orders

## 2023-11-28 NOTE — Progress Notes (Signed)
 Carelink Summary Report / Loop Recorder

## 2023-11-30 ENCOUNTER — Encounter: Payer: Self-pay | Admitting: Cardiology

## 2023-11-30 ENCOUNTER — Ambulatory Visit: Attending: Cardiology | Admitting: Cardiology

## 2023-11-30 VITALS — BP 118/68 | HR 60 | Resp 16 | Ht 70.0 in | Wt 158.0 lb

## 2023-11-30 DIAGNOSIS — E781 Pure hyperglyceridemia: Secondary | ICD-10-CM

## 2023-11-30 DIAGNOSIS — I2510 Atherosclerotic heart disease of native coronary artery without angina pectoris: Secondary | ICD-10-CM

## 2023-11-30 DIAGNOSIS — E782 Mixed hyperlipidemia: Secondary | ICD-10-CM | POA: Diagnosis not present

## 2023-11-30 DIAGNOSIS — I700 Atherosclerosis of aorta: Secondary | ICD-10-CM | POA: Diagnosis not present

## 2023-11-30 DIAGNOSIS — I639 Cerebral infarction, unspecified: Secondary | ICD-10-CM

## 2023-11-30 DIAGNOSIS — I351 Nonrheumatic aortic (valve) insufficiency: Secondary | ICD-10-CM

## 2023-11-30 NOTE — Patient Instructions (Signed)
 Medication Instructions:  No medication changes were made at this visit. Continue current regimen.   *If you need a refill on your cardiac medications before your next appointment, please call your pharmacy*  Lab Work: None ordered today. If you have labs (blood work) drawn today and your tests are completely normal, you will receive your results only by: MyChart Message (if you have MyChart) OR A paper copy in the mail If you have any lab test that is abnormal or we need to change your treatment, we will call you to review the results.  Testing/Procedures: None ordered today.  Follow-Up: At [facility], you and your health needs are our priority.  As part of our continuing mission to provide you with exceptional heart care, our providers are all part of one team.  This team includes your primary Cardiologist (physician) and Advanced Practice Providers or APPs (Physician Assistants and Nurse Practitioners) who all work together to provide you with the care you need, when you need it.  Your next appointment:   1 year(s)  Provider:   Olinda Bertrand, DO

## 2023-11-30 NOTE — Progress Notes (Signed)
 Cardiology Office Note:     NAME:  Caleb Cardenas    MRN: [id number] DOB:  March 17, 1956   PCP:  Narda Bacon, MD  Former Cardiology Providers: N/A Primary Cardiologist:  Olinda Bertrand, DO, [facility] (established care 08/21/2023) Electrophysiologist:  Boyce Byes, MD   Chief Complaint  Patient presents with   Follow-up    History of Present Illness:    Caleb Cardenas is a 68 y.o.  male whose past medical history and cardiovascular risk factors includes: Coronary artery calcification (noncontrast CT study January 2025), hypertension, cryptogenic stroke, thymoma, aortic atherosclerosis, emphysema.   Patient was referred to the practice in March 2025 as part of preoperative risk stratification for upcoming right knee replacement.  Given multiple cardiovascular risk factors shared decision was to proceed with echo and cardiac PET/CT.  Cardiac PET/CT was not covered by insurance and therefore patient was scheduled for nuclear stress test.  Echocardiogram noted low normal LVEF, mild hypokinesis, mild to moderate aortic regurgitation.  Nuclear stress test noted no reversible perfusion defect with findings to suggest fixed perfusion defect secondary to possible infarction.  Patient is considered to be intermediate risk for upcoming noncardiac surgery.  He underwent knee replacement in April 2025.  No postoperative cardiovascular complications.  He denies anginal chest pain or heart failure symptoms.  Home blood pressures are 135 mmHg on current medical therapy.  He stopped smoking about 1.5 years ago (likely December 2023).  Denies use of illicit or recreational drugs.  At the last office visit patient was recommended to follow-up with neurology given the fact that prior imaging studies have noted vertebrobasilar insufficiency, posterior circulation infarcts and carotid stenosis.  He was last seen by neurology in April 2024.  Current Medications: Current Meds  Medication Sig   amLODipine  (NORVASC )  10 MG tablet Take 1 tablet (10 mg total) by mouth daily.   atorvastatin  (LIPITOR) 80 MG tablet Take 80 mg by mouth daily.   clopidogrel  (PLAVIX ) 75 MG tablet Take 1 tablet (75 mg total) by mouth daily.   lisinopril -hydrochlorothiazide  (ZESTORETIC ) 10-12.5 MG tablet Take 1 tablet by mouth daily.   metoprolol  succinate (TOPROL -XL) 25 MG 24 hr tablet TAKE 1 TABLET (25 MG TOTAL) BY MOUTH DAILY.     Allergies:    Patient has no known allergies.   Past Medical History: Past Medical History:  Diagnosis Date   Arthritis    Blurred vision    Carotid artery disease (HCC)    Dizziness    Hypertension    Mediastinal mass    Stroke (cerebrum) (HCC) 03/2020   Vertebral basilar insufficiency    Vertigo     Past Surgical History: Past Surgical History:  Procedure Laterality Date   KNEE SURGERY     x2  in high school   MASS EXCISION     TONSILLECTOMY     as a child   TOTAL KNEE ARTHROPLASTY Right 09/25/2023   Procedure: ARTHROPLASTY, KNEE, TOTAL;  Surgeon: Liliane Rei, MD;  Location: [facility];  Service: Orthopedics;  Laterality: Right;    Social History: Social History   Tobacco Use   Smoking status: Former    Current packs/day: 1.00    Types: Cigarettes    Passive exposure: Never   Smokeless tobacco: Never   Tobacco comments:    Smoking 15-20 cigs a day. 03/14/2022 Tay  Vaping Use   Vaping status: Never Used  Substance Use Topics   Alcohol use: Not Currently    Comment: occ   Drug  use: No    Family History: Family History  Problem Relation Age of Onset   Heart attack Mother    Alzheimer's disease Father    Pancreatic cancer Brother     ROS:   Review of Systems  Cardiovascular:  Negative for chest pain, claudication, irregular heartbeat, leg swelling, near-syncope, orthopnea, palpitations, paroxysmal nocturnal dyspnea and syncope.  Respiratory:  Negative for shortness of breath.   Hematologic/Lymphatic: Negative for bleeding problem.    EKGs/Labs/Other Studies  Reviewed:    Echocardiogram: 09/11/2023  1. Left ventricular ejection fraction, by estimation, is 50 to 55%. The left ventricle has low normal function. The left ventricle demonstrates regional wall motion abnormalities (see scoring diagram/findings for description). There is mild left  ventricular hypertrophy. Left ventricular diastolic parameters are indeterminate. There is mild hypokinesis of the left ventricular, basal-mid inferior wall.  2. Right ventricular systolic function is normal. The right ventricular size is normal.  3. The mitral valve is normal in structure. Mild mitral valve regurgitation.  4. Eccentric jets/splay of aortic regurgitation. The aortic valve is tricuspid. There is mild calcification of the aortic valve. There is mild thickening of the aortic valve. Aortic valve regurgitation is mild to moderate. Aortic valve  sclerosis/calcification is present, without any evidence of aortic stenosis.  5. The inferior vena cava is normal in size with greater than 50% respiratory variability, suggesting right atrial pressure of 3 mmHg.  MPI: March 2025: Pharmacological stress test:   LV perfusion is abnormal. There is no evidence of ischemia. There is evidence of infarction. Defect 1: There is a medium defect with moderate reduction in uptake present in the mid to basal inferior and inferolateral location(s) that is fixed. There is abnormal wall motion in the defect area with hypokinesis of the mid inferolateral wall. Consistent with infarction. Defect 2: There is a small defect with moderate reduction in uptake present in the apical location(s) that is fixed. There is normal wall motion in the defect area. Consistent with artifact caused by diaphragmatic attenuation.   Left ventricular function is abnormal. Nuclear stress EF: 47%. The left ventricular ejection fraction is mildly decreased (45-54%). End diastolic cavity size is severely enlarged. End systolic cavity size is moderately  enlarged. No evidence of transient ischemic dilation (TID) noted.   Findings are consistent with infarction. The study is intermediate risk.  Labs:    Latest Ref Rng & Units 10/13/2023   12:42 PM 09/26/2023    3:40 AM 06/11/2022   12:27 AM  CBC  WBC 4.0 - 10.5 K/uL 7.2  11.2  9.0   Hemoglobin 13.0 - 17.0 g/dL 40.9  81.1  91.4   Hematocrit 39.0 - 52.0 % 36.8  36.1  33.9   Platelets 150 - 400 K/uL 429  147  144        Latest Ref Rng & Units 10/13/2023   12:42 PM 09/26/2023    3:40 AM 01/06/2023   10:15 AM  BMP  Glucose 70 - 99 mg/dL 782  956  213   BUN 8 - 23 mg/dL 57  30  21   Creatinine 0.61 - 1.24 mg/dL 0.86  5.78  4.69   BUN/Creat Ratio 10 - 24   12   Sodium 135 - 145 mmol/L 134  138  140   Potassium 3.5 - 5.1 mmol/L 4.7  4.7  3.4   Chloride 98 - 111 mmol/L 104  110  105   CO2 22 - 32 mmol/L 19  22  21  Calcium  8.9 - 10.3 mg/dL 9.3  8.0  8.5       Latest Ref Rng & Units 10/13/2023   12:42 PM 09/26/2023    3:40 AM 01/06/2023   10:15 AM  CMP  Glucose 70 - 99 mg/dL 161  096  045   BUN 8 - 23 mg/dL 57  30  21   Creatinine 0.61 - 1.24 mg/dL 4.09  8.11  9.14   Sodium 135 - 145 mmol/L 134  138  140   Potassium 3.5 - 5.1 mmol/L 4.7  4.7  3.4   Chloride 98 - 111 mmol/L 104  110  105   CO2 22 - 32 mmol/L 19  22  21    Calcium  8.9 - 10.3 mg/dL 9.3  8.0  8.5     Lab Results  Component Value Date   CHOL 199 04/21/2020   HDL 25 (L) 04/21/2020   LDLCALC 76 04/21/2020   TRIG 621 (HH) 04/21/2020   CHOLHDL 8.0 (H) 04/21/2020   No results for input(s): LIPOA in the last 8760 hours. No components found for: NTPROBNP No results for input(s): PROBNP in the last 8760 hours. No results for input(s): TSH in the last 8760 hours.  Physical Exam:    Today's Vitals   11/30/23 1415  BP: 118/68  Pulse: 60  Resp: 16  SpO2: 98%  Weight: 158 lb (71.7 kg)  Height: 5' 10 (1.778 m)   Body mass index is 22.67 kg/m. Wt Readings from Last 3 Encounters:  11/30/23 158 lb (71.7 kg)   10/13/23 175 lb (79.4 kg)  09/25/23 175 lb (79.4 kg)    Physical Exam  Constitutional: No distress.  hemodynamically stable  Neck: No JVD present.  Cardiovascular: Normal rate, regular rhythm, S1 normal and S2 normal. Exam reveals no gallop, no S3 and no S4.  No murmur heard. Pulmonary/Chest: Effort normal and breath sounds normal. No stridor. He has no wheezes. He has no rales.  Musculoskeletal:        General: No edema.     Cervical back: Neck supple.  Skin: Skin is warm.     Impression & Recommendation(s):  Impression:   ICD-10-CM   1. Coronary artery calcification  I25.10     2. Atherosclerosis of aorta (HCC)  I70.0     3. Mixed hyperlipidemia  E78.2     4. Pure hypertriglyceridemia  E78.1     5. Cryptogenic stroke (HCC)  I63.9     6. Nonrheumatic aortic valve insufficiency  I35.1         Recommendation(s):  Coronary artery calcification Atherosclerosis of aorta (HCC) Denies anginal chest pain or heart failure symptoms. Echocardiogram noted low normal LVEF at 50-55% with mild global hypokinesis. Pharmacological stress test reported no reversible perfusion defect with fixed perfusion defect to suggest prior infarction. No postoperative cardiovascular complications after his recent knee replacement. Continue high intensity statin, Lipitor 80 mg p.o. nightly. Continue antiplatelet therapy, Plavix  75 mg p.o. daily (started after his stroke) Reemphasized the importance of secondary prevention with focus on improving the modifiable cardiovascular risk factors such as glycemic control, lipid management, blood pressure control, weight loss.  Mixed hyperlipidemia Pure hypertriglyceridemia Continue Lipitor 80 mg p.o. nightly. Recommended that he follows up with PCP for fasting lipids and to monitor his LDL and triglycerides.  Cryptogenic stroke [facility]) Noted on prior imaging. Has been evaluated neurology.  Recommended that he follows up with them given his carotid  disease as well as posterior circulation disease. Reemphasized importance  of secondary prevention. Currently has had implantable loop recorder in place, last interrogation report from Nov 08, 2023 reviewed. Prior neurology notes have recommended a goal blood pressure between 120-140 mmHg.  Nonrheumatic aortic valve insufficiency Noted on echocardiogram from March 2025. Currently asymptomatic. Will continue to monitor.  As part of today's office visit discussed management of these 2 chronic comorbid conditions, most recent echocardiogram and stress test results since last office visit reviewed with him in detail, reemphasize importance of secondary prevention, prescription drug management.  Orders Placed:  No orders of the defined types were placed in this encounter.   Final Medication List:    No orders of the defined types were placed in this encounter.   There are no discontinued medications.    Current Outpatient Medications:    amLODipine  (NORVASC ) 10 MG tablet, Take 1 tablet (10 mg total) by mouth daily., Disp: 90 tablet, Rfl: 1   atorvastatin  (LIPITOR) 80 MG tablet, Take 80 mg by mouth daily., Disp: , Rfl:    clopidogrel  (PLAVIX ) 75 MG tablet, Take 1 tablet (75 mg total) by mouth daily., Disp: 90 tablet, Rfl: 3   lisinopril -hydrochlorothiazide  (ZESTORETIC ) 10-12.5 MG tablet, Take 1 tablet by mouth daily., Disp: , Rfl:    metoprolol  succinate (TOPROL -XL) 25 MG 24 hr tablet, TAKE 1 TABLET (25 MG TOTAL) BY MOUTH DAILY., Disp: 90 tablet, Rfl: 2   gabapentin  (NEURONTIN ) 300 MG capsule, Take a 300 mg capsule three times a day for two weeks following surgery.Then take a 300 mg capsule two times a day for two weeks. Then take a 300 mg capsule once a day for two weeks. Then discontinue., Disp: 84 capsule, Rfl: 0   methocarbamol  (ROBAXIN ) 500 MG tablet, Take 1 tablet (500 mg total) by mouth every 6 (six) hours as needed for muscle spasms., Disp: 40 tablet, Rfl: 0   ondansetron  (ZOFRAN ) 4  MG tablet, Take 1 tablet (4 mg total) by mouth every 6 (six) hours as needed for nausea., Disp: 20 tablet, Rfl: 0   oxyCODONE  (OXY IR/ROXICODONE ) 5 MG immediate release tablet, Take 1-2 tablets (5-10 mg total) by mouth every 6 (six) hours as needed for severe pain (pain score 7-10)., Disp: 42 tablet, Rfl: 0   traMADol  (ULTRAM ) 50 MG tablet, Take 1-2 tablets (50-100 mg total) by mouth every 6 (six) hours as needed for moderate pain (pain score 4-6)., Disp: 40 tablet, Rfl: 0  Consent:    NA  Disposition:   1 year follow up   Patient may be asked to follow-up sooner based on the results of the above-mentioned testing.  His questions and concerns were addressed to his satisfaction. He voices understanding of the recommendations provided during this encounter.    Signed, Awilda Bogus, [facility]  A Division of [facility] [address] [place], [place] [address]  [place], [place] [address] 3:23 PM 11/30/23

## 2023-12-11 ENCOUNTER — Ambulatory Visit (INDEPENDENT_AMBULATORY_CARE_PROVIDER_SITE_OTHER)

## 2023-12-11 DIAGNOSIS — I639 Cerebral infarction, unspecified: Secondary | ICD-10-CM

## 2023-12-11 LAB — CUP PACEART REMOTE DEVICE CHECK
Date Time Interrogation Session: 20250622232931
Implantable Pulse Generator Implant Date: 20230911

## 2023-12-27 NOTE — Progress Notes (Signed)
 Carelink Summary Report / Loop Recorder

## 2024-01-04 ENCOUNTER — Telehealth: Payer: Self-pay | Admitting: *Deleted

## 2024-01-04 NOTE — Progress Notes (Incomplete)
 Telephone nursing appointment for review of most recent CT-Chest results. I verified patient's identity x2 and began nursing interview.   Patient states issues as follows...  -Pain: *** -Fatigue: *** -Skin: *** -Lungs: *** -Appetite: ***   Patient denies any other related issues at this time.   Meaningful use complete.   Patient aware of their telephone appointment w/ Ashlyn Bruning PA-C. I left my extension [phone] in case patient needs anything. Patient verbalized understanding. This concludes the nursing interview.   Patient preferred phone # [phone]   Rosaline Minerva, LPN

## 2024-01-04 NOTE — Telephone Encounter (Signed)
 CALLED PATIENT TO INFORM OF CT FOR 01-09-24- ARRIVAL TIME- 6:45 PM @ [facility], NO RESTRICTIONS TO SCAN, PATIENT TO CHECK IN @ ED, FOR OUTPATIENT CT, PATIENT TO RECEIVE RESULTS FROM PA ASHLYN BRUNING ON 01-17-24 @ 10 AM VIA TELEPHONE, LVM FOR A RETURN CALL

## 2024-01-09 ENCOUNTER — Encounter (HOSPITAL_COMMUNITY): Payer: Self-pay

## 2024-01-09 ENCOUNTER — Ambulatory Visit (HOSPITAL_COMMUNITY): Attending: Urology

## 2024-01-11 ENCOUNTER — Ambulatory Visit

## 2024-01-11 DIAGNOSIS — I639 Cerebral infarction, unspecified: Secondary | ICD-10-CM | POA: Diagnosis not present

## 2024-01-11 LAB — CUP PACEART REMOTE DEVICE CHECK
Date Time Interrogation Session: 20250723232739
Implantable Pulse Generator Implant Date: 20230911

## 2024-01-15 NOTE — Progress Notes (Signed)
 Carelink Summary Report / Loop Recorder

## 2024-01-17 ENCOUNTER — Ambulatory Visit: Payer: Medicare HMO | Admitting: Urology

## 2024-02-12 ENCOUNTER — Ambulatory Visit

## 2024-02-12 DIAGNOSIS — I639 Cerebral infarction, unspecified: Secondary | ICD-10-CM

## 2024-02-13 LAB — CUP PACEART REMOTE DEVICE CHECK
Date Time Interrogation Session: 20250823232002
Implantable Pulse Generator Implant Date: 20230911

## 2024-02-21 NOTE — Progress Notes (Signed)
 ------------------------------------------------------------------------------- Summary: 29 B, 30 B -------------------------------------------------------------------------------  [facility] 02/21/24  Dental procedures in this visit  . [id number] - RESIN-BASED COMPOSITE - ONE SURFACE, POSTERIOR 30 B (Completed)    Service provider: Alan Delores Fairly, DMD    Billing provider: Alan Delores Kotis, DMD  . [phone] - RESIN-BASED COMPOSITE - ONE SURFACE, POSTERIOR 29 B (Completed)    Service provider: Alan Delores Fairly, DMD    Billing provider: Alan Delores Fairly, DMD   SUBJECTIVE Chief complaint: Patient reports with caries lesion(s) in need of restorative treatment.  Health History? Medical History[1] Medications Ordered Prior to Encounter[2] Allergies[3]  Problem List[4]  Surgical History[5] Family History[6] Social History[7]  Medical Risk Assessment ASA GRADE: ASA 1 - Normal health patient  Medical history was reviewed and updated. No contraindication to care.  Pain Level Tooth/teeth to be treated testing asymptomatic. Pain score currently: 0 (0-10 scale)  Patient's expectations: Patient understands the procedure and expects a functional and esthetically pleasing restoration.  OBJECTIVE Vital Signs BP Readings from Last 1 Encounters:  02/21/24 (!) 147/78  Pulse: 51  Pre-procedure Examination 29 B, 30 B    Procedure ANESTHESIA Topical anesthetic: Benzocaine 20% Lidocaine  2% epi: 1:100K x Carpules: 1 carpule;  ISOLATION Cotton rolls isolation PREPARATION Caries excavated BASE/LINER N/A MATRIX No matrix used RESTORATIVE MATERIALS Bonding agent: Adhese, etch-and-rinse Composite resin: Filtek One Bulk Fill, shade: A2 Sealant: Clinpro Amalgam: NA  FINISHING/POLISHING Occlusion checked and adjusted Restoration finished and polished using diamonds and carbides  Post-procedure examination: Uneventful procedure, goal with procedure  achieved.  Additional notes:   ASSESSMENT Diagnosis: Caries, restorable Prognosis: Good  PLAN Post-operative Instructions Patient informed about possible short-term sensitivity to cold.  Provider should be contacted in the event of severe sensitivity or any sensitivity to heat, and/or pain on biting. Patient advised to be careful with soft tissues on the anesthetized side until the anesthesia wears off.  Patient Education Patient educated on proper oral hygiene techniques and the importance of regular dental check-ups in order to maximize the longevity of the restoration.  Referrals: None  Next visit: Recall HYG maintenance   Treatment Providers Pamila Bergeron, DA II Alan Delores Fairly, DMD [facility] [address] [place], [place]  [address] [phone]        [1] Past Medical History: Diagnosis Date  . Hypertension   [2] Current Outpatient Medications on File Prior to Visit  Medication Sig Dispense Refill  . amLODIPine  (NORVASC ) 10 mg tablet Take 10 mg by mouth in the morning.    . atorvastatin  (LIPITOR) 10 mg tablet Take 10 mg by mouth 1 (one) time each day.    . atorvastatin  (LIPITOR) 80 mg tablet Take 1 tablet by mouth 1 (one) time each day. (Patient not taking: Reported on 02/05/2024)    . atorvastatin  (LIPITOR) 80 mg tablet Take 80 mg by mouth. (Patient not taking: Reported on 02/05/2024)    . clopidogreL  (PLAVIX ) 75 mg tablet Take 75 mg by mouth.    . lisinopriL  (PRINIVIL ,ZESTRIL ) 10 mg tablet Take 10 mg by mouth.    . lisinopriL -hydrochlorothiazide  (PRINZIDE ,ZESTORETIC ) 10-12.5 mg tablet Take 1 tablet by mouth 1 (one) time each day.     No current facility-administered medications on file prior to visit.  [3] No Known Allergies [4] Patient Active Problem List Diagnosis  . CKD (chronic kidney disease) stage 3, GFR 30-59 ml/min (CMS/HCC)  . Dizziness  . Hypertensive urgency  . Type B2 thymoma (CMS/HCC)  .  Vertebrobasilar insufficiency  .  Encounter for dental examination  . Primary osteoarthritis of right knee  . OA (osteoarthritis) of knee  [5] Past Surgical History: Procedure Laterality Date  . AUTOCHONDROCYTE IMPLANT KNEE    . JOINT REPLACEMENT Right 09/25/2023  [6] No family history on file. [7] Social History Tobacco Use  . Smoking status: Former    Types: Cigarettes    Start date: 32  . Smokeless tobacco: Never  Substance Use Topics  . Alcohol use: Not Currently  . Drug use: Not Currently

## 2024-03-06 NOTE — Progress Notes (Signed)
 Remote Loop Recorder Transmission

## 2024-03-14 ENCOUNTER — Ambulatory Visit

## 2024-03-14 DIAGNOSIS — I639 Cerebral infarction, unspecified: Secondary | ICD-10-CM

## 2024-03-14 LAB — CUP PACEART REMOTE DEVICE CHECK
Date Time Interrogation Session: 20250924232230
Implantable Pulse Generator Implant Date: 20230911

## 2024-03-18 NOTE — Progress Notes (Signed)
 Remote Loop Recorder Transmission

## 2024-03-21 NOTE — Progress Notes (Signed)
 Remote Loop Recorder Transmission

## 2024-04-14 ENCOUNTER — Encounter

## 2024-04-15 ENCOUNTER — Ambulatory Visit (INDEPENDENT_AMBULATORY_CARE_PROVIDER_SITE_OTHER)

## 2024-04-15 DIAGNOSIS — I4729 Other ventricular tachycardia: Secondary | ICD-10-CM | POA: Diagnosis not present

## 2024-04-15 LAB — CUP PACEART REMOTE DEVICE CHECK
Date Time Interrogation Session: 20251026232252
Implantable Pulse Generator Implant Date: 20230911

## 2024-04-23 NOTE — Progress Notes (Signed)
 Remote Loop Recorder Transmission

## 2024-05-15 ENCOUNTER — Encounter

## 2024-05-16 ENCOUNTER — Encounter

## 2024-05-17 ENCOUNTER — Ambulatory Visit: Attending: Internal Medicine

## 2024-05-17 DIAGNOSIS — I639 Cerebral infarction, unspecified: Secondary | ICD-10-CM

## 2024-05-18 LAB — CUP PACEART REMOTE DEVICE CHECK
Date Time Interrogation Session: 20251127232254
Implantable Pulse Generator Implant Date: 20230911

## 2024-05-22 NOTE — Progress Notes (Signed)
 Remote Loop Recorder Transmission

## 2024-05-23 ENCOUNTER — Other Ambulatory Visit (HOSPITAL_COMMUNITY): Payer: Self-pay | Admitting: Urology

## 2024-05-23 DIAGNOSIS — C61 Malignant neoplasm of prostate: Secondary | ICD-10-CM

## 2024-06-04 ENCOUNTER — Encounter (HOSPITAL_COMMUNITY)

## 2024-06-10 ENCOUNTER — Encounter (HOSPITAL_COMMUNITY)
Admission: RE | Admit: 2024-06-10 | Discharge: 2024-06-10 | Disposition: A | Discharge status: Home / Self Care | Source: Ambulatory Visit | Attending: Urology | Admitting: Urology

## 2024-06-10 DIAGNOSIS — C61 Malignant neoplasm of prostate: Secondary | ICD-10-CM | POA: Insufficient documentation

## 2024-06-10 MED ORDER — FLOTUFOLASTAT F 18 GALLIUM 296-5846 MBQ/ML IV SOLN
7.8000 | Freq: Once | INTRAVENOUS | Status: AC
  Administered 2024-06-10: 7.8 via INTRAVENOUS
  Filled 2024-06-10: qty 8

## 2024-06-15 ENCOUNTER — Encounter

## 2024-06-17 ENCOUNTER — Encounter

## 2024-06-17 ENCOUNTER — Ambulatory Visit

## 2024-06-17 DIAGNOSIS — I639 Cerebral infarction, unspecified: Secondary | ICD-10-CM | POA: Diagnosis not present

## 2024-06-18 LAB — CUP PACEART REMOTE DEVICE CHECK
Date Time Interrogation Session: 20251228232733
Implantable Pulse Generator Implant Date: 20230911

## 2024-06-19 NOTE — Progress Notes (Signed)
 Remote Loop Recorder Transmission

## 2024-07-05 NOTE — Progress Notes (Signed)
 GU Location of Tumor / Histology: Prostate  If Prostate Cancer, Gleason Score is 8 (4 + 4) and PSA is (51.8)  Caleb Cardenas presented 1 month ago with signs/symptoms of:  IMPRESSION From PSMA PET scan: 1. 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 2. No PSMA avid pelvic, abdominal, or mesial lymph nodes. 3. No skeletal metastasis. 4. Focal dilatation of the infrarenal abdominal aorta to 3.2 cm  Biopsies of prostate (if applicable) revealed:      Past/Anticipated interventions by urology, if any:  Prostate needle core biopsy on 05/06/24  Past/Anticipated interventions by medical oncology, if any:  none  Weight changes, if any: {:18581}  Bowel/Bladder complaints, if any: {:18581}   Nausea/Vomiting, if any: {:18581}  Pain issues, if any:  {:18581}  SAFETY ISSUES: Prior radiation? {:18581} Pacemaker/ICD? {:18581} Possible current pregnancy? {:18581} Is the patient on methotrexate? {:18581}  Current Complaints / other details:  ***

## 2024-07-09 ENCOUNTER — Encounter: Payer: Self-pay | Admitting: Urology

## 2024-07-11 ENCOUNTER — Ambulatory Visit
Admission: RE | Admit: 2024-07-11 | Discharge: 2024-07-11 | Disposition: A | Discharge status: Home / Self Care | Source: Ambulatory Visit | Attending: Urology | Admitting: Urology

## 2024-07-11 ENCOUNTER — Encounter: Payer: Self-pay | Admitting: Urology

## 2024-07-11 ENCOUNTER — Ambulatory Visit
Admission: RE | Admit: 2024-07-11 | Discharge: 2024-07-11 | Disposition: A | Discharge status: Home / Self Care | Source: Ambulatory Visit | Attending: Radiation Oncology | Admitting: Radiation Oncology

## 2024-07-11 VITALS — BP 138/65 | HR 54 | Temp 97.7°F | Resp 18 | Ht 70.0 in | Wt 165.0 lb

## 2024-07-11 DIAGNOSIS — I10 Essential (primary) hypertension: Secondary | ICD-10-CM | POA: Diagnosis not present

## 2024-07-11 DIAGNOSIS — Z79899 Other long term (current) drug therapy: Secondary | ICD-10-CM | POA: Diagnosis not present

## 2024-07-11 DIAGNOSIS — C61 Malignant neoplasm of prostate: Secondary | ICD-10-CM | POA: Diagnosis present

## 2024-07-11 DIAGNOSIS — M129 Arthropathy, unspecified: Secondary | ICD-10-CM | POA: Insufficient documentation

## 2024-07-11 DIAGNOSIS — Z800 Family history of malignant neoplasm of digestive organs: Secondary | ICD-10-CM | POA: Insufficient documentation

## 2024-07-11 DIAGNOSIS — Z8673 Personal history of transient ischemic attack (TIA), and cerebral infarction without residual deficits: Secondary | ICD-10-CM | POA: Insufficient documentation

## 2024-07-11 DIAGNOSIS — Z7902 Long term (current) use of antithrombotics/antiplatelets: Secondary | ICD-10-CM | POA: Diagnosis not present

## 2024-07-11 DIAGNOSIS — Z87891 Personal history of nicotine dependence: Secondary | ICD-10-CM | POA: Insufficient documentation

## 2024-07-11 NOTE — Progress Notes (Signed)
 " Radiation Oncology         [phone] ________________________________  Initial Outpatient Consultation  Name: Caleb Cardenas MRN: [id number]  Date: 07/11/2024  DOB: 07-Jul-1955  CC:Sun, Talitha, MD  Elisabeth Valli BIRCH, MD   REFERRING PHYSICIAN: Elisabeth Valli BIRCH, MD  DIAGNOSIS: 69 y.o. 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    ICD-10-CM   1. Malignant neoplasm of prostate (HCC)  C61       HISTORY OF PRESENT ILLNESS: Caleb Cardenas is a 69 y.o. male with a new diagnosis of prostate cancer.  He is well-known to us , previously seen in January 2024 to discuss the potential role for postoperative radiotherapy s/p resection of a type IIb thymoma, under the care of Dr. Shyrl, in December 2023.  Per NCCN guidelines, the recommendation was to continue monitoring with surveillance CT chest scans every 6 months for the first 2 years posttreatment and then annually thereafter.  He has been diligent in his follow-up and restaging CT chest scans in January 2024 and January 2025 were without any evidence of disease recurrence or progression.  His next CT chest scan was due in January 2026 but, in the interim, he was found to have an elevated PSA at 51.8 in November 2025.  This prompted further evaluation with a transrectal ultrasound with 12 biopsies of the prostate which was performed on 05/06/2024, under the care of Dr. Elisabeth.  The prostate volume measured 46 cc.  Out of 12 core biopsies, 12 were positive.  The maximum Gleason score was 4+4, and this was seen in 7 of the 12 cores and Gleason 3+5 was seen in the left mid.  Additionally, Gleason 4+3 was seen in 3 of 12 cores and 3+3 in 1 of 12 cores.  A PSMA PET scan was performed on 06/10/2024 to complete disease staging and did confirm locally advanced disease involving bilateral seminal vesicles but no evidence of metastatic disease in the skeleton or lymph nodes and no evidence of disease  recurrence in the chest (thymoma).   The patient reviewed the biopsy and imaging results with his urologist and was started on Orgovyx ADT on June 26, 2024.  He has kindly been referred to us  today for discussion of potential radiation treatment options.  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   Vertebral basilar insufficiency    Vertigo       PAST SURGICAL HISTORY: Past Surgical History:  Procedure Laterality Date   KNEE SURGERY     x2  in high school   MASS EXCISION     TONSILLECTOMY     as a child   TOTAL KNEE ARTHROPLASTY Right 09/25/2023   Procedure: ARTHROPLASTY, KNEE, TOTAL;  Surgeon: Melodi Lerner, MD;  Location: [facility];  Service: Orthopedics;  Laterality: Right;    FAMILY HISTORY:  Family History  Problem Relation Age of Onset   Heart attack Mother    Alzheimer's disease Father    Pancreatic cancer Brother     SOCIAL HISTORY:  Social History   Socioeconomic History   Marital status: Divorced    Spouse name: Not on file   Number of children: 1   Years of education: Not on  file   Highest education level: Some college, no degree  Occupational History    Comment: house flipping, painting  Tobacco Use   Smoking status: Former    Current packs/day: 1.00    Types: Cigarettes    Passive exposure: Never   Smokeless tobacco: Never   Tobacco comments:    Smoking 15-20 cigs a day. 03/14/2022 Tay  Vaping Use   Vaping status: Never Used  Substance and Sexual Activity   Alcohol use: Not Currently    Comment: occ   Drug use: No   Sexual activity: Yes  Other Topics Concern   Not on file  Social History Narrative   Lives alone   Caffeine- sodas 2-3 a day   Social Drivers of Health   Tobacco Use: Medium Risk (11/30/2023)   Patient History    Smoking Tobacco Use:  Former    Smokeless Tobacco Use: Never    Passive Exposure: Never  Physicist, Medical Strain: Not on file  Food Insecurity: No Food Insecurity (09/25/2023)   Hunger Vital Sign    Worried About Running Out of Food in the Last Year: Never true    Ran Out of Food in the Last Year: Never true  Transportation Needs: No Transportation Needs (09/25/2023)   PRAPARE - Administrator, Civil Service (Medical): No    Lack of Transportation (Non-Medical): No  Physical Activity: Not on file  Stress: Not on file  Social Connections: Moderately Integrated (09/25/2023)   Social Connection and Isolation Panel    Frequency of Communication with Friends and Family: Three times a week    Frequency of Social Gatherings with Friends and Family: Three times a week    Attends Religious Services: More than 4 times per year    Active Member of Clubs or Organizations: Yes    Attends Banker Meetings: More than 4 times per year    Marital Status: Divorced  Intimate Partner Violence: Not At Risk (09/25/2023)   Humiliation, Afraid, Rape, and Kick questionnaire    Fear of Current or Ex-Partner: No    Emotionally Abused: No    Physically Abused: No    Sexually Abused: No  Depression (PHQ2-9): Not on file  Alcohol Screen: Not on file  Housing: Low Risk (09/25/2023)   Housing Stability Vital Sign    Unable to Pay for Housing in the Last Year: No    Number of Times Moved in the Last Year: 0    Homeless in the Last Year: No  Utilities: Not At Risk (09/25/2023)   AHC Utilities    Threatened with loss of utilities: No  Health Literacy: Not on file    ALLERGIES: Patient has no known allergies.  MEDICATIONS:  Current Outpatient Medications  Medication Sig Dispense Refill   amLODipine  (NORVASC ) 10 MG tablet Take 1 tablet (10 mg total) by mouth daily. 90 tablet 1   atorvastatin  (LIPITOR) 80 MG tablet Take 80 mg by mouth daily.     clopidogrel  (PLAVIX ) 75 MG tablet Take 1 tablet (75 mg total) by  mouth daily. 90 tablet 3   gabapentin  (NEURONTIN ) 300 MG capsule Take a 300 mg capsule three times a day for two weeks following surgery.Then take a 300 mg capsule two times a day for two weeks. Then take a 300 mg capsule once a day for two weeks. Then discontinue. 84 capsule 0   lisinopril -hydrochlorothiazide  (ZESTORETIC ) 10-12.5 MG tablet Take 1 tablet by mouth daily.     methocarbamol  (ROBAXIN ) 500  MG tablet Take 1 tablet (500 mg total) by mouth every 6 (six) hours as needed for muscle spasms. 40 tablet 0   metoprolol  succinate (TOPROL -XL) 25 MG 24 hr tablet TAKE 1 TABLET (25 MG TOTAL) BY MOUTH DAILY. 90 tablet 2   ondansetron  (ZOFRAN ) 4 MG tablet Take 1 tablet (4 mg total) by mouth every 6 (six) hours as needed for nausea. 20 tablet 0   oxyCODONE  (OXY IR/ROXICODONE ) 5 MG immediate release tablet Take 1-2 tablets (5-10 mg total) by mouth every 6 (six) hours as needed for severe pain (pain score 7-10). 42 tablet 0   traMADol  (ULTRAM ) 50 MG tablet Take 1-2 tablets (50-100 mg total) by mouth every 6 (six) hours as needed for moderate pain (pain score 4-6). 40 tablet 0   No current facility-administered medications for this visit.    REVIEW OF SYSTEMS:  On review of systems, the patient reports that he is doing well overall. He denies any chest pain, shortness of breath, cough, fevers, chills, night sweats, unintended weight changes. He denies any bowel disturbances, and denies abdominal pain, nausea or vomiting. He denies any new musculoskeletal or joint aches or pains. His IPSS was 17, indicating moderate urinary symptoms but not bothersome enough to warrant taking daily medication in his opinion. His SHIM was 15, indicating he has moderate erectile dysfunction. A complete review of systems is obtained and is otherwise negative.    PHYSICAL EXAM:  Wt Readings from Last 3 Encounters:  11/30/23 158 lb (71.7 kg)  10/13/23 175 lb (79.4 kg)  09/25/23 175 lb (79.4 kg)   Temp Readings from Last 3  Encounters:  10/13/23 97.8 F (36.6 C)  09/26/23 97.6 F (36.4 C) (Oral)  09/18/23 98 F (36.7 C) (Oral)   BP Readings from Last 3 Encounters:  11/30/23 118/68  10/13/23 137/81  09/26/23 (!) 137/54   Pulse Readings from Last 3 Encounters:  11/30/23 60  10/13/23 65  09/26/23 64    /10  In general this is a well appearing Caucasian male in no acute distress. He's alert and oriented x4 and appropriate throughout the examination. Cardiopulmonary assessment is negative for acute distress, and he exhibits normal effort.     KPS = 100  100 - Normal; no complaints; no evidence of disease. 90   - Able to carry on normal activity; minor signs or symptoms of disease. 80   - Normal activity with effort; some signs or symptoms of disease. 35   - Cares for self; unable to carry on normal activity or to do active work. 60   - Requires occasional assistance, but is able to care for most of his personal needs. 50   - Requires considerable assistance and frequent medical care. 40   - Disabled; requires special care and assistance. 30   - Severely disabled; hospital admission is indicated although death not imminent. 20   - Very sick; hospital admission necessary; active supportive treatment necessary. 10   - Moribund; fatal processes progressing rapidly. 0     - Dead  Karnofsky DA, Abelmann WH, Craver LS and Burchenal [facility] [phone]) The use of the nitrogen mustards in the palliative treatment of carcinoma: with particular reference to bronchogenic carcinoma Cancer 1 634-56  LABORATORY DATA:  Lab Results  Component Value Date   WBC 7.2 10/13/2023   HGB 11.8 (L) 10/13/2023   HCT 36.8 (L) 10/13/2023   MCV 94.4 10/13/2023   PLT 429 (H) 10/13/2023   Lab Results  Component Value Date  NA 134 (L) 10/13/2023   K 4.7 10/13/2023   CL 104 10/13/2023   CO2 19 (L) 10/13/2023   Lab Results  Component Value Date   ALT 8 06/11/2022   AST 28 06/11/2022   ALKPHOS 73 06/11/2022   BILITOT 0.7  06/11/2022     RADIOGRAPHY: CUP PACEART REMOTE DEVICE CHECK Result Date: 06/18/2024 ILR summary report received. Battery status OK. Normal device function. No new symptom, tachy, brady, episodes. No new AF episodes. Monthly summary reports and ROV/PRN 1 nocturnal pause event, 5sec in duration, EGM c/w sinus pause Presenting rhythm SR with bigeminal PVC's, PVC's=11.7%, increase this monitoring cycle LA, CVRS     IMPRESSION/PLAN: 1. 69 y.o. gentleman with Stage T3b adenocarcinoma of the prostate with Gleason Score of 4+4, and PSA of 51.8. We discussed the patient's workup and outlined the nature of prostate cancer in this setting. The patient's T stage, Gleason's score, and PSA put him into the high risk group. Accordingly, he is eligible for a variety of potential treatment options including prostatectomy or LT-ADT concurrent with either 8 weeks of external radiation or 5 weeks of external radiation with an upfront brachytherapy boost. We discussed the available radiation techniques, and focused on the details and logistics of delivery. The patient may not be an ideal candidate for brachytherapy boost with IPSS score of 17 indicating moderate LUTS. Therefore, we discussed and outlined the risks, benefits, short and long-term effects associated with daily external beam radiotherapy and compared and contrasted these with prostatectomy. We discussed the role of rectal spacer gel in reducing the rectal toxicity associated with radiotherapy. We also detailed the role of ADT in the treatment of high risk prostate cancer and outlined the associated side effects that could be expected with this therapy.  He has already started Orgovyx ADT on 06/26/2024 and feels that he is tolerating this fairly well.  He was encouraged to ask questions that were answered to his stated satisfaction.  At the conclusion of our conversation, the patient is interested in moving forward with 8 weeks of external beam therapy concurrent  with ADT. He has started Orgovyx ADT on 06/26/24 so we will share our discussion with Dr. Elisabeth and make arrangements for fiducial markers and rectal spacer gel placement in late Feb 2026 or early March 2026, prior to simulation, to reduce rectal toxicity from radiotherapy. The patient appears to have a good understanding of his disease and our treatment recommendations which are of curative intent and is in agreement with the stated plan.  Therefore, we will move forward with treatment planning accordingly, in anticipation of beginning IMRT approximately 2 months after starting ADT. We enjoyed meeting with him again today and look forward to continuing to participate in his care.  We personally spent 45 minutes in this encounter including chart review, reviewing radiological studies, meeting face-to-face with the patient, entering orders and completing documentation.    Caleb MICAEL Rusk, PA-C    Donnice Barge, MD  [facility]  Radiation Oncology Direct Dial: [phone]  Fax: [phone] [email or web address]  Skype  LinkedIn    "

## 2024-07-13 NOTE — Progress Notes (Signed)
 Introduced myself to the patient as the prostate nurse navigator. He is here to discuss his radiation treatment options. I provided patient with some written education and my direct contact information.  Patient knows to reach out with any questions or barriers that may arise.

## 2024-07-16 ENCOUNTER — Other Ambulatory Visit: Payer: Self-pay | Admitting: Urology

## 2024-07-16 ENCOUNTER — Encounter

## 2024-07-17 ENCOUNTER — Telehealth: Payer: Self-pay | Admitting: Cardiology

## 2024-07-17 ENCOUNTER — Telehealth (HOSPITAL_BASED_OUTPATIENT_CLINIC_OR_DEPARTMENT_OTHER): Payer: Self-pay | Admitting: *Deleted

## 2024-07-17 NOTE — Telephone Encounter (Signed)
 Pt has been scheduled tele preop appt 07/31/24. Med rec and consent are done.

## 2024-07-17 NOTE — Telephone Encounter (Signed)
" ° °  Name: Caleb Cardenas  DOB: 1955-12-29  MRN: [id number]  Primary Cardiologist: Madonna Large, DO   Preoperative team, please contact this patient and set up a phone call appointment for further preoperative risk assessment. Please obtain consent and complete medication review. Thank you for your help.  I confirm that guidance regarding antiplatelet and oral anticoagulation therapy has been completed and, if necessary, noted below. - Patient is on Plavix  after a stroke so would defer recommendations for holding this to PCP/ Neurology.  I also confirmed the patient resides in the state of [place] . As per [facility] Medical Board telemedicine laws, the patient must reside in the state in which the provider is licensed.   [name], PA-C 07/17/2024, 10:29 AM [facility]    "

## 2024-07-17 NOTE — Telephone Encounter (Signed)
"  ° °  Pre-operative Risk Assessment    Patient Name: Caleb Cardenas  DOB: 1955/08/31 MRN: [id number]   Date of last office visit: 11/30/23 Date of next office visit: n/a    Request for Surgical Clearance    Procedure:  Viviane Buttner implants and Space oar installation   Date of Surgery:  Clearance 08/20/24                                Surgeon:  Dr. Elisabeth Socks Group or Practice Name:  [facility]  Phone number:  [phone]  Fax number:  [phone]    Type of Clearance Requested:   - Medical  - Pharmacy:  Hold Clopidogrel  (Plavix ) 5 days prior    Type of Anesthesia:  MAC   Additional requests/questions:    Bonney Sheffield JONELLE Lenora   07/17/2024, 9:56 AM   "

## 2024-07-17 NOTE — Telephone Encounter (Signed)
 Pt has been scheduled tele preop appt 07/31/24. Med rec and consent are done.      Patient Consent for Virtual Visit        Caleb Cardenas has provided verbal consent on 07/17/2024 for a virtual visit (video or telephone).   CONSENT FOR VIRTUAL VISIT FOR:  Caleb Cardenas  By participating in this virtual visit I agree to the following:  I hereby voluntarily request, consent and authorize [facility] and its employed or contracted physicians, physician assistants, nurse practitioners or other licensed health care professionals (the Practitioner), to provide me with telemedicine health care services (the Services) as deemed necessary by the treating Practitioner. I acknowledge and consent to receive the Services by the Practitioner via telemedicine. I understand that the telemedicine visit will involve communicating with the Practitioner through live audiovisual communication technology and the disclosure of certain medical information by electronic transmission. I acknowledge that I have been given the opportunity to request an in-person assessment or other available alternative prior to the telemedicine visit and am voluntarily participating in the telemedicine visit.  I understand that I have the right to withhold or withdraw my consent to the use of telemedicine in the course of my care at any time, without affecting my right to future care or treatment, and that the Practitioner or I may terminate the telemedicine visit at any time. I understand that I have the right to inspect all information obtained and/or recorded in the course of the telemedicine visit and may receive copies of available information for a reasonable fee.  I understand that some of the potential risks of receiving the Services via telemedicine include:  Delay or interruption in medical evaluation due to technological equipment failure or disruption; Information transmitted may not be sufficient (e.g. poor  resolution of images) to allow for appropriate medical decision making by the Practitioner; and/or  In rare instances, security protocols could fail, causing a breach of personal health information.  Furthermore, I acknowledge that it is my responsibility to provide information about my medical history, conditions and care that is complete and accurate to the best of my ability. I acknowledge that Practitioner's advice, recommendations, and/or decision may be based on factors not within their control, such as incomplete or inaccurate data provided by me or distortions of diagnostic images or specimens that may result from electronic transmissions. I understand that the practice of medicine is not an exact science and that Practitioner makes no warranties or guarantees regarding treatment outcomes. I acknowledge that a copy of this consent can be made available to me via my patient portal [facility] MyChart), or I can request a printed copy by calling the office of [facility].    I understand that my insurance will be billed for this visit.   I have read or had this consent read to me. I understand the contents of this consent, which adequately explains the benefits and risks of the Services being provided via telemedicine.  I have been provided ample opportunity to ask questions regarding this consent and the Services and have had my questions answered to my satisfaction. I give my informed consent for the services to be provided through the use of telemedicine in my medical care

## 2024-07-18 ENCOUNTER — Encounter

## 2024-07-18 ENCOUNTER — Ambulatory Visit: Payer: Self-pay | Admitting: Cardiovascular Disease

## 2024-07-18 ENCOUNTER — Ambulatory Visit: Attending: Cardiovascular Disease

## 2024-07-18 DIAGNOSIS — I639 Cerebral infarction, unspecified: Secondary | ICD-10-CM

## 2024-07-18 LAB — CUP PACEART REMOTE DEVICE CHECK
Date Time Interrogation Session: 20260128231820
Implantable Pulse Generator Implant Date: 20230911

## 2024-07-19 ENCOUNTER — Other Ambulatory Visit: Payer: Self-pay | Admitting: Urology

## 2024-07-19 DIAGNOSIS — C61 Malignant neoplasm of prostate: Secondary | ICD-10-CM

## 2024-07-26 NOTE — Progress Notes (Signed)
 Remote Loop Recorder Transmission

## 2024-07-31 ENCOUNTER — Ambulatory Visit

## 2024-08-16 ENCOUNTER — Encounter

## 2024-08-18 ENCOUNTER — Ambulatory Visit

## 2024-08-19 ENCOUNTER — Encounter

## 2024-08-20 ENCOUNTER — Ambulatory Visit (HOSPITAL_COMMUNITY): Admit: 2024-08-20 | Admitting: Urology

## 2024-08-22 ENCOUNTER — Ambulatory Visit: Admitting: Radiation Oncology

## 2024-09-16 ENCOUNTER — Encounter

## 2024-09-18 ENCOUNTER — Ambulatory Visit

## 2024-09-19 ENCOUNTER — Encounter

## 2024-10-17 ENCOUNTER — Encounter

## 2024-10-19 ENCOUNTER — Ambulatory Visit

## 2024-10-21 ENCOUNTER — Encounter

## 2024-11-19 ENCOUNTER — Ambulatory Visit

## 2024-12-20 ENCOUNTER — Ambulatory Visit

## 2025-01-20 ENCOUNTER — Ambulatory Visit

## 2025-02-20 ENCOUNTER — Ambulatory Visit

## 2025-03-23 ENCOUNTER — Ambulatory Visit

## 2025-04-23 ENCOUNTER — Ambulatory Visit

## 2025-05-24 ENCOUNTER — Ambulatory Visit

## 2025-06-24 ENCOUNTER — Ambulatory Visit
